# Patient Record
Sex: Male | Born: 1970 | Race: White | Hispanic: No | Marital: Single | Smoking: Never smoker
Health system: Southern US, Community
[De-identification: ages and names within clinical notes are randomized; demographics above are authoritative.]

## PROBLEM LIST (undated history)

## (undated) DIAGNOSIS — E785 Hyperlipidemia, unspecified: Secondary | ICD-10-CM

## (undated) DIAGNOSIS — I422 Other hypertrophic cardiomyopathy: Secondary | ICD-10-CM

## (undated) DIAGNOSIS — I517 Cardiomegaly: Secondary | ICD-10-CM

## (undated) DIAGNOSIS — I2510 Atherosclerotic heart disease of native coronary artery without angina pectoris: Secondary | ICD-10-CM

## (undated) DIAGNOSIS — E119 Type 2 diabetes mellitus without complications: Secondary | ICD-10-CM

## (undated) DIAGNOSIS — N182 Chronic kidney disease, stage 2 (mild): Secondary | ICD-10-CM

## (undated) HISTORY — DX: Cardiomegaly: I51.7

## (undated) HISTORY — DX: Other hypertrophic cardiomyopathy: I42.2

## (undated) HISTORY — DX: Hyperlipidemia, unspecified: E78.5

## (undated) HISTORY — DX: Atherosclerotic heart disease of native coronary artery without angina pectoris: I25.10

## (undated) HISTORY — DX: Type 2 diabetes mellitus without complications: E11.9

## (undated) HISTORY — PX: TRICEPS TENDON REPAIR: SHX2577

## (undated) HISTORY — DX: Chronic kidney disease, stage 2 (mild): N18.2

---

## 1997-06-14 ENCOUNTER — Ambulatory Visit (HOSPITAL_COMMUNITY): Admission: RE | Admit: 1997-06-14 | Discharge: 1997-06-14 | Payer: Self-pay

## 1997-06-21 ENCOUNTER — Ambulatory Visit (HOSPITAL_BASED_OUTPATIENT_CLINIC_OR_DEPARTMENT_OTHER): Admission: RE | Admit: 1997-06-21 | Discharge: 1997-06-21 | Payer: Self-pay | Admitting: Plastic Surgery

## 1999-04-25 ENCOUNTER — Encounter (INDEPENDENT_AMBULATORY_CARE_PROVIDER_SITE_OTHER): Payer: Self-pay | Admitting: Specialist

## 1999-04-25 ENCOUNTER — Ambulatory Visit (HOSPITAL_BASED_OUTPATIENT_CLINIC_OR_DEPARTMENT_OTHER): Admission: RE | Admit: 1999-04-25 | Discharge: 1999-04-25 | Payer: Self-pay | Admitting: Plastic Surgery

## 1999-05-01 ENCOUNTER — Encounter (INDEPENDENT_AMBULATORY_CARE_PROVIDER_SITE_OTHER): Payer: Self-pay | Admitting: *Deleted

## 1999-05-01 ENCOUNTER — Ambulatory Visit (HOSPITAL_BASED_OUTPATIENT_CLINIC_OR_DEPARTMENT_OTHER): Admission: RE | Admit: 1999-05-01 | Discharge: 1999-05-01 | Payer: Self-pay | Admitting: Plastic Surgery

## 1999-06-02 ENCOUNTER — Emergency Department (HOSPITAL_COMMUNITY): Admission: EM | Admit: 1999-06-02 | Discharge: 1999-06-02 | Payer: Self-pay | Admitting: Emergency Medicine

## 1999-06-03 ENCOUNTER — Encounter: Payer: Self-pay | Admitting: Emergency Medicine

## 1999-08-08 ENCOUNTER — Ambulatory Visit (HOSPITAL_COMMUNITY): Admission: RE | Admit: 1999-08-08 | Discharge: 1999-08-08 | Payer: Self-pay | Admitting: Surgery

## 2003-02-11 ENCOUNTER — Ambulatory Visit (HOSPITAL_BASED_OUTPATIENT_CLINIC_OR_DEPARTMENT_OTHER): Admission: RE | Admit: 2003-02-11 | Discharge: 2003-02-11 | Payer: Self-pay | Admitting: Surgery

## 2007-05-29 ENCOUNTER — Ambulatory Visit (HOSPITAL_BASED_OUTPATIENT_CLINIC_OR_DEPARTMENT_OTHER): Admission: RE | Admit: 2007-05-29 | Discharge: 2007-05-29 | Payer: Self-pay | Admitting: Orthopedic Surgery

## 2010-06-20 NOTE — Op Note (Signed)
Roberto Morrison, Roberto Morrison NO.:  [id number]   MEDICAL RECORD NO.:  [id number]          PATIENT TYPE:  AMB   LOCATION:  DSC                          FACILITY:  [facility]   PHYSICIAN:  Loreta Ave, M.D. DATE OF BIRTH:  Oct 01, 1970   DATE OF PROCEDURE:  05/29/2007  DATE OF DISCHARGE:                               OPERATIVE REPORT   PREOPERATIVE DIAGNOSIS:  Complete avulsion, triceps tendon, left elbow  of olecranon.  Olecranon spurring.   POSTOPERATIVE DIAGNOSIS:  Complete avulsion, triceps tendon, left elbow  of olecranon.  Olecranon spurring.   PROCEDURE:  Exploration left elbow with debridement and primary repair,  reattachment of triceps tendon to the olecranon with a 5.5-mm  bioabsorbable anchor and FiberWire suture x2.  Debridement of olecranon  spur.   SURGEON:  Loreta Ave, MD.   ASSISTANT:  Genene Churn. Barry Dienes, PA.   ANESTHESIA:  General   BLOOD LOSS:  Minimal.   TOURNIQUET TIME:  45 minutes.   SPECIMENS:  None.   CULTURES:  None.   COMPLICATIONS:  None.   DRESSINGS:  Soft compressive with a Bledsoe brace locked at 70 degrees  of flexion.   PROCEDURE:  The patient was brought to the operating room, placed on the  table in supine position.  After adequate anesthesia obtained,  tourniquet applied upper aspect of left arm.  Prepped and draped in the  usual sterile fashion.  Longitudinal incision from the olecranon  proximally.  Skin and subcutaneous tissue divided.  Triceps completely  avulsed off with a fair amount of intratendinous tearing.  Debrided back  to healthy tissue.  Retracted 3-4 cm, but still very mobile as it was an  acute tear.  Ulnar nerve identified and protected throughout of the  medial side.  Olecranon reduced back to healthy tissue.  Spurring at the  olecranon triceps attachment taken down smoothly with fluoroscopic  guidance.  Using fluoroscopy, an anchor was then placed right in the  middle of the olecranon at the triceps  attachment with a 5.5-mm  bioabsorbable anchor.  The #2 FiberWire sutures were then weaved up.  The medial and lateral side of the triceps up in the healthy tissue and  then brought back down.  Elbow extended, and sutures firmly tied down  giving me a nice firm reattachment of the triceps down to the olecranon.  The small avulsions of bone within a tendon had been debrided.  I could  bring it almost 90 degrees of flexion before I start to get too much  tension on the repair.  The soft tissue over top of this was oversewn  once I felt to have a good repair.  Wound irrigated.  Closed with Vicryl and staples.  Sterile compressive  dressing applied.  Sling applied with the brace as mentioned above.  Tourniquet have been deflated and removed.  The anesthesia reversed.  Brought to recovery room.  Tolerated surgery well.  No complications.      Loreta Ave, M.D.  Electronically Signed     DFM/MEDQ  D:  05/29/2007  T:  05/30/2007  Job:  [id number]

## 2010-06-23 NOTE — Op Note (Signed)
[facility]  Patient:    Roberto Morrison, Roberto Morrison                        MRN: [id number] Proc. Date: 08/08/99 Adm. Date:  [id number] Attending:  Abigail Miyamoto A                           Operative Report  PREOPERATIVE DIAGNOSIS:  Umbilical hernia.  POSTOPERATIVE DIAGNOSIS:  Umbilical hernia.  PROCEDURE:  Umbilical hernia repair.  SURGEON:  Abigail Miyamoto, M.D.  ANESTHESIA:  General endotracheal anesthesia and 0.25% Marcaine plain.  ESTIMATED BLOOD LOSS:  Minimal.  PROCEDURE IN DETAIL:  The patient was brought to the operating room and properly identified.  He was placed supine on the operating table and general anesthesia was induced.  Next, his abdomen was prepped and draped in the usual sterile fashion. using a #15 blade, a small transverse incision was made just below the umbilicus. The incision was carried down through the fascia with the cautery.  The umbilicus was the controlled circumferentially with a hemostat and then dissected free from the underlying soft tissue.  The small hernia defect was then identified and was found to contain preperitoneal fat.  ______ fat was excised.  The hernia defect  was then closed with two interrupted #1 Novofil sutures in a figure-of-eight pattern.  At this point, the wound was then anesthetized with 0.25% Marcaine and then irrigated.  The umbilicus was fastened back down in place with a single 2-0 Vicryl suture.  The skin was then closed with interrupted 0 Vicryl sutures and interrupted 4-0 Monocryl.  Steri-Strips, gauze and Tegaderm was applied.  The patient tolerated the procedure well.  All sponge, needle and instrument counts  were correct at the end of the procedure.  The patient was then extubated in the operating room and taken in stable condition to the recovery room. DD:  08/08/99 TD:  08/08/99 Job: [id number]

## 2010-06-23 NOTE — Op Note (Signed)
NAMEQUASHON, [name]                           ACCOUNT NO.:  [id number]   MEDICAL RECORD NO.:  [id number]                   PATIENT TYPE:  AMB   LOCATION:  DSC                                  FACILITY:  [facility]   PHYSICIAN:  Abigail Miyamoto, M.D.              DATE OF BIRTH:  [birth year]   DATE OF PROCEDURE:  02/11/2003  DATE OF DISCHARGE:                                 OPERATIVE REPORT   PREOPERATIVE DIAGNOSIS:  Recurrent umbilical hernia.   POSTOPERATIVE DIAGNOSIS:  Recurrent umbilical hernia.   PROCEDURE:  Umbilical hernia repair with mesh.   SURGEON:  Abigail Miyamoto, M.D.   ANESTHESIA:  LMA and 0.5% Marcaine with epinephrine.   ESTIMATED BLOOD LOSS:  Minimal.   DESCRIPTION OF PROCEDURE:  The patient was brought to the operating room and  identified as Roberto Morrison.  He was placed supine on the operating table and  anesthesia was induced.  His abdomen was then prepped and draped in the  usual sterile fashion.  His previous scar just below the umbilicus was  anesthetized with 0.5% Marcaine.  The incision was then made through the  previous scar with a #15 blade.  Dissection was carried down to the fascia  which was identified.  His two previously placed sutures were still intact  and there was a small little opening between the sutures with a little  omentum sticking through it.  The size of this was approximately 3 mm.  The  previous sutures were removed and the fascia was opened slightly larger.  A  piece of Bard oval mesh was brought onto the field and placed down through  the fascial defect and then sewn underneath as an onlay with 0 Prolene  sutures.  The fascia was then closed over top of this with figure-of-eight  #1 Prolene sutures.  Good coverage of the defect with mesh and good closure  of the fascia was achieved.  The umbilicus was tied in place with a 3-0  Vicryl suture.  Subcutaneous tissue was closed with interrupted 0 Vicryl  sutures and the skin was closed with  running 4-0 Monocryl.  Steri-Strips,  gauze, and tape were then applied.  The patient tolerated the procedure  well.  All needle, sponge, and instrument counts correct at the end of the  procedure.  The patient was then extubated in the operating room and taken  in stable condition to the recovery room.                                               Abigail Miyamoto, M.D.    DB/MEDQ  D:  02/11/2003  T:  02/11/2003  Job:  [id number]

## 2010-06-23 NOTE — Op Note (Signed)
[place]  Patient:    Roberto Morrison, Roberto Morrison                        MRN: [id number] Proc. Date: 05/01/99 Adm. Date:  [id number] Attending:  Loura Halt Ii                           Operative Report  PREOPERATIVE DIAGNOSIS:  Lipomatosis of trunk.  POSTOPERATIVE DIAGNOSIS:  Lipomatosis of trunk.  OPERATION PERFORMED:  Excision of lipomas on back x five, lipomas ranged in size from 2 to 3 cm.  SURGEON:  Alfredia Ferguson, M.D.  ANESTHESIA:   1% Xylocaine, 1:100,000 epinephrine.  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 He has had numerous lipomas removed in the past.  He has five on his back, which now are visible and have been growing.  He wishes to have them removed to prevent any further growth of these lesions.  DESCRIPTION OF SURGERY:  With the patient in a prone position, areas for the excision were marked with a skin marker and local anesthesia was infiltrated in  five different areas.  There were three on the left side of the back and two on the right side.  The area was then prepped and draped in a sterile fashion.  After waiting 5 minutes, an incision was made over the first lipoma, which was in the  left upper back.  The incision was deepened through the skin and subcutaneous tissue.  A lobulated lipoma was visualized and using blunt dissection, was dissected out of its anatomic bed.  Hemostasis was accomplished using pressure.  The wound was closed by using a running 5-0 nylon suture.  The remaining four lipomas were removed in an identical fashion.  Light dressings were applied. Patient was discharged home in satisfactory condition. DD:  05/01/99 TD:  05/01/99 Job: [id number]

## 2010-06-23 NOTE — Op Note (Signed)
[place]. [facility]  Patient:    Roberto Morrison, Roberto Morrison                          MRN: [id number] Proc. Date: 04/25/99 Adm. Date:  [id number] Disc. Date: [id number] Attending:  Loura Halt Ii                           Operative Report  PREOPERATIVE DIAGNOSIS:  Lipomatosis of trunk.  POSTOPERATIVE DIAGNOSIS:  Lipomatosis of trunk.  PROCEDURE:  Excision of lipomas anterior right trunk x six, size ranging from 1 cm to 2.5 cm.  SURGEON:  Alfredia Ferguson, M.D.  ANESTHESIA:  Xylocaine 1%, 1:100,000 epinephrine.  INDICATIONS:  This is a 40 year old gentleman with lipomatosis of his trunk. He has had numerous lipomas removed in the past.  He has about six more that he wishes to have removed today.  He has another half a dozen or so on his posterior trunk, which he is scheduled to remove in the next several weeks.  Each of these areas  were marked prior to beginning the surgery.  DESCRIPTION OF PROCEDURE:  Patient was placed in the supine position and the right side of his abdomen was prepped and draped in a sterile fashion.  Local anesthesia was infiltrated overlying each mark which had been placed over the lipomas. After waiting approximately 5 minutes, an incision was made over each of the six lipomas in his anterior abdomen.  Using blunt dissection, the normal fatty tissue was spread until visualizing the lipoma.  The lipoma was grasped and dissected away  from the surrounding fatty tissue.  The lipomas were removed individually from ach of the six incisions.  Following removal of the lipomas, each incision was closed using running nylon 5-0. DD:  04/27/99 TD:  04/27/99 Job: [id number]

## 2010-10-31 LAB — POCT HEMOGLOBIN-HEMACUE: Hemoglobin: 16.6

## 2012-03-14 ENCOUNTER — Other Ambulatory Visit: Payer: Self-pay | Admitting: Orthopedic Surgery

## 2012-03-14 DIAGNOSIS — M898X2 Other specified disorders of bone, upper arm: Secondary | ICD-10-CM

## 2012-03-18 ENCOUNTER — Ambulatory Visit
Admission: RE | Admit: 2012-03-18 | Discharge: 2012-03-18 | Disposition: A | Discharge status: Home / Self Care | Payer: Medicare PPO | Source: Ambulatory Visit | Attending: Orthopedic Surgery | Admitting: Orthopedic Surgery

## 2012-03-18 DIAGNOSIS — M898X2 Other specified disorders of bone, upper arm: Secondary | ICD-10-CM

## 2013-12-28 ENCOUNTER — Emergency Department (HOSPITAL_BASED_OUTPATIENT_CLINIC_OR_DEPARTMENT_OTHER): Payer: Medicare PPO

## 2013-12-28 ENCOUNTER — Encounter (HOSPITAL_BASED_OUTPATIENT_CLINIC_OR_DEPARTMENT_OTHER): Payer: Self-pay | Admitting: *Deleted

## 2013-12-28 ENCOUNTER — Emergency Department (HOSPITAL_BASED_OUTPATIENT_CLINIC_OR_DEPARTMENT_OTHER)
Admission: EM | Admit: 2013-12-28 | Discharge: 2013-12-28 | Disposition: A | Discharge status: Home / Self Care | Payer: Medicare PPO | Attending: Emergency Medicine | Admitting: Emergency Medicine

## 2013-12-28 DIAGNOSIS — R0789 Other chest pain: Secondary | ICD-10-CM | POA: Insufficient documentation

## 2013-12-28 DIAGNOSIS — R945 Abnormal results of liver function studies: Secondary | ICD-10-CM | POA: Diagnosis not present

## 2013-12-28 DIAGNOSIS — R7989 Other specified abnormal findings of blood chemistry: Secondary | ICD-10-CM

## 2013-12-28 DIAGNOSIS — R079 Chest pain, unspecified: Secondary | ICD-10-CM | POA: Diagnosis present

## 2013-12-28 LAB — COMPREHENSIVE METABOLIC PANEL
ALBUMIN: 3.7 g/dL (ref 3.5–5.2)
ALK PHOS: 40 U/L (ref 39–117)
ALT: 101 U/L — ABNORMAL HIGH (ref 0–53)
AST: 60 U/L — ABNORMAL HIGH (ref 0–37)
Anion gap: 9 (ref 5–15)
BILIRUBIN TOTAL: 0.7 mg/dL (ref 0.3–1.2)
BUN: 24 mg/dL — ABNORMAL HIGH (ref 6–23)
CHLORIDE: 106 meq/L (ref 96–112)
CO2: 28 mEq/L (ref 19–32)
Calcium: 9.7 mg/dL (ref 8.4–10.5)
Creatinine, Ser: 1.3 mg/dL (ref 0.50–1.35)
GFR calc Af Amer: 77 mL/min — ABNORMAL LOW (ref >90)
GFR calc non Af Amer: 66 mL/min — ABNORMAL LOW (ref >90)
Glucose, Bld: 107 mg/dL — ABNORMAL HIGH (ref 70–99)
POTASSIUM: 4.5 meq/L (ref 3.7–5.3)
Sodium: 143 mEq/L (ref 137–147)
Total Protein: 6.6 g/dL (ref 6.0–8.3)

## 2013-12-28 LAB — CBC WITH DIFFERENTIAL/PLATELET
BASOS ABS: 0 10*3/uL (ref 0.0–0.1)
Basophils Relative: 0 % (ref 0–1)
EOS PCT: 2 % (ref 0–5)
Eosinophils Absolute: 0.1 10*3/uL (ref 0.0–0.7)
HEMATOCRIT: 43.9 % (ref 39.0–52.0)
Hemoglobin: 15.3 g/dL (ref 13.0–17.0)
Lymphocytes Relative: 37 % (ref 12–46)
Lymphs Abs: 1.8 10*3/uL (ref 0.7–4.0)
MCH: 31.4 pg (ref 26.0–34.0)
MCHC: 34.9 g/dL (ref 30.0–36.0)
MCV: 90.1 fL (ref 78.0–100.0)
MONO ABS: 0.5 10*3/uL (ref 0.1–1.0)
MONOS PCT: 9 % (ref 3–12)
Neutro Abs: 2.5 10*3/uL (ref 1.7–7.7)
Neutrophils Relative %: 52 % (ref 43–77)
Platelets: 155 10*3/uL (ref 150–400)
RBC: 4.87 MIL/uL (ref 4.22–5.81)
RDW: 14.6 % (ref 11.5–15.5)
WBC: 4.8 10*3/uL (ref 4.0–10.5)

## 2013-12-28 LAB — TROPONIN I: Troponin I: <0.3 ng/mL (ref <0.30)

## 2013-12-28 NOTE — ED Notes (Signed)
Pt amb to room 11 with quick steady gait in nad. Pt reports "funny feeling" in left chest x last Monday. Pt reports he had this same pain 2 years ago, seen at [facility] with no findings per pt.

## 2013-12-28 NOTE — ED Provider Notes (Signed)
CSN: [id number]     Arrival date & time 12/28/13  0945 History   First MD Initiated Contact with Patient 12/28/13 0957     Chief Complaint  Patient presents with  . Chest Pain     (Consider location/radiation/quality/duration/timing/severity/associated sxs/prior Treatment) Patient is a 43 y.o. male presenting with chest pain. The history is provided by the patient. No language interpreter was used.  Chest Pain Pain location:  L chest Pain quality: aching   Pain radiates to:  Does not radiate Pain radiates to the back: no   Pain severity:  Moderate Onset quality:  Gradual Duration:  1 week Timing:  Constant Progression:  Unchanged Context: not breathing, not eating, no movement, not raising an arm, no stress and no trauma   Relieved by:  Nothing Worsened by:  Nothing tried Associated symptoms: no abdominal pain, no altered mental status, no back pain, no cough, no palpitations, no shortness of breath and not vomiting   Risk factors: male sex   Risk factors: no high cholesterol, no hypertension and no smoking    Patient reports he had a similar episode 2 years ago. Patient was evaluated at [facility]. Patient reports he did not have any unusual findings. History reviewed. No pertinent past medical history. History reviewed. No pertinent past surgical history. History reviewed. No pertinent family history. History  Substance Use Topics  . Smoking status: Never Smoker   . Smokeless tobacco: Not on file  . Alcohol Use: Not on file    Review of Systems  Respiratory: Negative for cough and shortness of breath.   Cardiovascular: Positive for chest pain. Negative for palpitations.  Gastrointestinal: Negative for vomiting and abdominal pain.  Musculoskeletal: Negative for back pain.  All other systems reviewed and are negative.     Allergies  Review of patient's allergies indicates no known allergies.  Home Medications   Prior to Admission medications   Not on File    BP 138/87 mmHg  Pulse 71  Temp(Src) 97.8 F (36.6 C) (Oral)  Resp 16  Ht 6' (1.829 m)  Wt 275 lb (124.739 kg)  BMI 37.29 kg/m2  SpO2 98% Physical Exam  Constitutional: He appears well-developed.  HENT:  Head: Normocephalic.  Right Ear: External ear normal.  Left Ear: External ear normal.  Eyes: Conjunctivae and EOM are normal. Pupils are equal, round, and reactive to light.  Neck: Normal range of motion. Neck supple.  Cardiovascular: Normal rate and normal heart sounds.   Pulmonary/Chest: Effort normal and breath sounds normal.  Abdominal: Soft.  Musculoskeletal: Normal range of motion.  Neurological: He is alert.  Skin: Skin is warm.  Psychiatric: He has a normal mood and affect.  Nursing note and vitals reviewed.   ED Course  Procedures (including critical care time) Labs Review Labs Reviewed - No data to display  Imaging Review No results found.   EKG Interpretation   Date/Time:  Monday December 28 2013 09:51:04 EST Ventricular Rate:  71 PR Interval:  172 QRS Duration: 112 QT Interval:  402 QTC Calculation: 436 R Axis:   -19 Text Interpretation:  Normal sinus rhythm Normal ECG Confirmed by POLLINA   MD, CHRISTOPHER ([id number]) on 12/28/2013 10:17:31 AM      MDM Troponin  Is negative.LFt's are slightly elevated at 24 AST is elevated at 60 AST is elevated at 10 1 glucose is 107.  Chest xray is normal.   Pt's chest wall is nontender.  I advised follow up with primary regarding elevated bun and  lft.  Pt advised that he needs repeat labs.  I advised he needs further evaluation.  EKG is normal and troponin is negative.  He is advised to follow up for further evaluation.  Pt advised asa.    Final diagnoses:  Chest pain, unspecified chest pain type  LFT elevation        Fransico Meadow, PA-C 12/28/13 [place], MD 12/28/13 1306

## 2013-12-28 NOTE — Discharge Instructions (Signed)
Chest Pain (Nonspecific) It is often hard to give a diagnosis for the cause of chest pain. There is always a chance that your pain could be related to something serious, such as a heart attack or a blood clot in the lungs. You need to follow up with your doctor. HOME CARE  If antibiotic medicine was given, take it as directed by your doctor. Finish the medicine even if you start to feel better.  For the next few days, avoid activities that bring on chest pain. Continue physical activities as told by your doctor.  Do not use any tobacco products. This includes cigarettes, chewing tobacco, and e-cigarettes.  Avoid drinking alcohol.  Only take medicine as told by your doctor.  Follow your doctor's suggestions for more testing if your chest pain does not go away.  Keep all doctor visits you made. GET HELP IF:  Your chest pain does not go away, even after treatment.  You have a rash with blisters on your chest.  You have a fever. GET HELP RIGHT AWAY IF:   You have more pain or pain that spreads to your arm, neck, jaw, back, or belly (abdomen).  You have shortness of breath.  You cough more than usual or cough up blood.  You have very bad back or belly pain.  You feel sick to your stomach (nauseous) or throw up (vomit).  You have very bad weakness.  You pass out (faint).  You have chills. This is an emergency. Do not wait to see if the problems will go away. Call your local emergency services (911 in [place] Do not drive yourself to the hospital. MAKE SURE YOU:   Understand these instructions.  Will watch your condition.  Will get help right away if you are not doing well or get worse. Document Released: 07/11/2007 Document Revised: 01/27/2013 Document Reviewed: 07/11/2007 [facility] Patient Information 2015 [place], [place]. This information is not intended to replace advice given to you by your health care provider. Make sure you discuss any questions you have with your  health care provider. Liver Profile A liver profile is a battery of tests which helps your caregiver evaluate your liver function. The following tests are often included in the liver profile: Alanine aminotransferase (ALT or SGPT) This is an enzyme found primarily in the liver. Abnormalities may represent liver disease. This is found in cells of the liver so when it is elevated, it has been released by damaged cells. Albumin - The serum albumin is one of the major proteins in the blood and a reflection of the general state of nutrition. This is low when the liver is unable to do its job. It is also low when protein is lost in the urine. NORMAL FINDINGS Adult/Elderly  Total protein: 6.4-8.3 g/dL or 64-83 g/L (SI units)  Albumin: 3.5-5 g/dL or 35-50 g/L (SI units)  Globulin: 2.3-3.4 g/dL  Alpha1 globulin: 0.1-0.3 g/dL or 1-3 g/L (SI units)  Alpha2 globulin: 0.6-1 g/dL or 6-10 g/L (SI units)  Beta globulin: 0.7-1.1 g/dL or 7-11 g/L (SI units) Children  Total protein  Premature infant: 4.2-7.6 g/dL  Newborn: 4.6-7.4 g/dL  Infant: 6-6.7 g/dL  Child: 6.2-8 g/dL  Albumin  Premature infant: 3-4.2 g/dL  Newborn: 3.5-5.4 g/dL  Infant: 4.4-5.4 g/dL  Child: 4-5.9 g/dL Albumin/Globulin ratio - Calculated by dividing the albumin by the globulin. It is a measure of well being.  Alkaline phosphatase - This is an enzyme which is important in diagnosing proper bone and liver functions.  NORMAL FINDINGS Age / Normal Value (units/L)  0-5 days / 35-140  Less than 3 yr / 15-60  3-6 yr / 15-50  6-12 yr / 10-50  12-18 yr / 10-40  Adult / 0-35 units/L or 0-0.58 microKat/L (SI Units) (Females tend to have slightly lower levels than males)  Elderly / Slightly higher than adults Aspartate aminotransferase (AST or SGOT) - an enzyme found in skeletal and heart muscle, liver and other organs. Abnormalities may represent liver disease. This is found in cells of the liver so when it is  elevated, it has been released by damaged cells. Bilirubin, Total: A chemical involved with liver functions. High concentrations may result in jaundice. Jaundice is a yellowing of the skin and the whites of the eyes. NORMAL FINDINGS Blood  Adult/elderly/child  Total bilirubin: 0.3-1.0 mg/dL or 5.1-17 micromole/L (SI units)  Indirect bilirubin: 0.2-0.8 mg/dL or 3.4-12.0 micromole/L (SI units)  Direct bilirubin: 0.1-0.3 mg/dL or 1.7-5.1 micromole/L (SI units)  Newborn total bilirubin: 1.0-12.0 mg/dL or 17.1-205 micromole/L (SI units)  Urine0-0.02 mg/dL Ranges for normal findings may vary among different laboratories and hospitals. You should always check with your doctor after having lab work or other tests done to discuss the meaning of your test results and whether your values are considered within normal limits PREPARATION FOR TEST No preparation or fasting is necessary unless you have been informed otherwise. A blood sample is obtained by inserting a needle into a vein in the arm. MEANING OF TEST  Your caregiver will go over the test results with you and discuss the importance and meaning of your results, as well as treatment options and the need for additional tests if necessary. OBTAINING THE TEST RESULTS It is your responsibility to obtain your test results. Ask the lab or department performing the test when and how you will get your results. Document Released: 02/25/2004 Document Revised: 04/16/2011 Document Reviewed: 05/26/2013 [facility] Patient Information 2015 [place], [place]. This information is not intended to replace advice given to you by your health care provider. Make sure you discuss any questions you have with your health care provider.

## 2014-11-24 DIAGNOSIS — B002 Herpesviral gingivostomatitis and pharyngotonsillitis: Secondary | ICD-10-CM | POA: Insufficient documentation

## 2020-03-07 ENCOUNTER — Other Ambulatory Visit: Payer: Self-pay | Admitting: Chiropractic Medicine

## 2020-03-07 DIAGNOSIS — R29898 Other symptoms and signs involving the musculoskeletal system: Secondary | ICD-10-CM

## 2020-03-07 DIAGNOSIS — M79621 Pain in right upper arm: Secondary | ICD-10-CM

## 2020-03-25 ENCOUNTER — Other Ambulatory Visit: Payer: Self-pay | Admitting: Chiropractic Medicine

## 2020-03-28 ENCOUNTER — Ambulatory Visit
Admission: RE | Admit: 2020-03-28 | Discharge: 2020-03-28 | Disposition: A | Discharge status: Home / Self Care | Payer: 59 | Source: Ambulatory Visit | Attending: Chiropractic Medicine | Admitting: Chiropractic Medicine

## 2020-03-28 DIAGNOSIS — R29898 Other symptoms and signs involving the musculoskeletal system: Secondary | ICD-10-CM

## 2020-03-28 DIAGNOSIS — M79621 Pain in right upper arm: Secondary | ICD-10-CM

## 2020-07-21 ENCOUNTER — Other Ambulatory Visit: Payer: Self-pay

## 2020-07-21 ENCOUNTER — Inpatient Hospital Stay (HOSPITAL_BASED_OUTPATIENT_CLINIC_OR_DEPARTMENT_OTHER)
Admission: EM | Admit: 2020-07-21 | Discharge: 2020-07-30 | DRG: 233 | Disposition: A | Discharge status: Home / Self Care | Payer: 59 | Attending: Cardiothoracic Surgery | Admitting: Cardiothoracic Surgery

## 2020-07-21 ENCOUNTER — Encounter (HOSPITAL_BASED_OUTPATIENT_CLINIC_OR_DEPARTMENT_OTHER): Payer: Self-pay

## 2020-07-21 ENCOUNTER — Emergency Department (HOSPITAL_BASED_OUTPATIENT_CLINIC_OR_DEPARTMENT_OTHER): Payer: 59

## 2020-07-21 DIAGNOSIS — N289 Disorder of kidney and ureter, unspecified: Secondary | ICD-10-CM | POA: Diagnosis not present

## 2020-07-21 DIAGNOSIS — R072 Precordial pain: Secondary | ICD-10-CM | POA: Diagnosis present

## 2020-07-21 DIAGNOSIS — I2510 Atherosclerotic heart disease of native coronary artery without angina pectoris: Secondary | ICD-10-CM | POA: Diagnosis not present

## 2020-07-21 DIAGNOSIS — I110 Hypertensive heart disease with heart failure: Secondary | ICD-10-CM | POA: Diagnosis present

## 2020-07-21 DIAGNOSIS — I25118 Atherosclerotic heart disease of native coronary artery with other forms of angina pectoris: Secondary | ICD-10-CM | POA: Diagnosis present

## 2020-07-21 DIAGNOSIS — I255 Ischemic cardiomyopathy: Secondary | ICD-10-CM | POA: Diagnosis not present

## 2020-07-21 DIAGNOSIS — Z9889 Other specified postprocedural states: Secondary | ICD-10-CM

## 2020-07-21 DIAGNOSIS — Z951 Presence of aortocoronary bypass graft: Secondary | ICD-10-CM | POA: Diagnosis not present

## 2020-07-21 DIAGNOSIS — R079 Chest pain, unspecified: Secondary | ICD-10-CM | POA: Diagnosis not present

## 2020-07-21 DIAGNOSIS — Z888 Allergy status to other drugs, medicaments and biological substances status: Secondary | ICD-10-CM

## 2020-07-21 DIAGNOSIS — I444 Left anterior fascicular block: Secondary | ICD-10-CM | POA: Diagnosis present

## 2020-07-21 DIAGNOSIS — I5021 Acute systolic (congestive) heart failure: Secondary | ICD-10-CM | POA: Diagnosis present

## 2020-07-21 DIAGNOSIS — I25110 Atherosclerotic heart disease of native coronary artery with unstable angina pectoris: Secondary | ICD-10-CM | POA: Diagnosis not present

## 2020-07-21 DIAGNOSIS — D62 Acute posthemorrhagic anemia: Secondary | ICD-10-CM | POA: Diagnosis not present

## 2020-07-21 DIAGNOSIS — I2584 Coronary atherosclerosis due to calcified coronary lesion: Secondary | ICD-10-CM | POA: Diagnosis present

## 2020-07-21 DIAGNOSIS — Z20822 Contact with and (suspected) exposure to covid-19: Secondary | ICD-10-CM | POA: Diagnosis present

## 2020-07-21 DIAGNOSIS — I214 Non-ST elevation (NSTEMI) myocardial infarction: Secondary | ICD-10-CM | POA: Diagnosis present

## 2020-07-21 DIAGNOSIS — E785 Hyperlipidemia, unspecified: Secondary | ICD-10-CM | POA: Diagnosis not present

## 2020-07-21 DIAGNOSIS — Z01810 Encounter for preprocedural cardiovascular examination: Secondary | ICD-10-CM | POA: Diagnosis not present

## 2020-07-21 LAB — RESP PANEL BY RT-PCR (FLU A&B, COVID) ARPGX2
Influenza A by PCR: NEGATIVE
Influenza B by PCR: NEGATIVE
SARS Coronavirus 2 by RT PCR: NEGATIVE

## 2020-07-21 LAB — BASIC METABOLIC PANEL
Anion gap: 7 (ref 5–15)
Anion gap: 7 (ref 5–15)
BUN: 19 mg/dL (ref 6–20)
BUN: 15 mg/dL (ref 6–20)
CO2: 27 mmol/L (ref 22–32)
CO2: 26 mmol/L (ref 22–32)
Calcium: 9.3 mg/dL (ref 8.9–10.3)
Calcium: 9 mg/dL (ref 8.9–10.3)
Chloride: 104 mmol/L (ref 98–111)
Chloride: 104 mmol/L (ref 98–111)
Creatinine, Ser: 1.46 mg/dL — ABNORMAL HIGH (ref 0.61–1.24)
Creatinine, Ser: 1.31 mg/dL — ABNORMAL HIGH (ref 0.61–1.24)
GFR, Estimated: 59 mL/min — ABNORMAL LOW (ref >60)
GFR, Estimated: >60 mL/min (ref >60)
Glucose, Bld: 111 mg/dL — ABNORMAL HIGH (ref 70–99)
Glucose, Bld: 138 mg/dL — ABNORMAL HIGH (ref 70–99)
Potassium: 4.1 mmol/L (ref 3.5–5.1)
Potassium: 3.5 mmol/L (ref 3.5–5.1)
Sodium: 138 mmol/L (ref 135–145)
Sodium: 137 mmol/L (ref 135–145)

## 2020-07-21 LAB — CBC
HCT: 48 % (ref 39.0–52.0)
Hemoglobin: 16.4 g/dL (ref 13.0–17.0)
MCH: 31.8 pg (ref 26.0–34.0)
MCHC: 34.2 g/dL (ref 30.0–36.0)
MCV: 93 fL (ref 80.0–100.0)
Platelets: 202 10*3/uL (ref 150–400)
RBC: 5.16 MIL/uL (ref 4.22–5.81)
RDW: 14.3 % (ref 11.5–15.5)
WBC: 8 10*3/uL (ref 4.0–10.5)
nRBC: 0 % (ref 0.0–0.2)

## 2020-07-21 LAB — TROPONIN I (HIGH SENSITIVITY)
Troponin I (High Sensitivity): 150 ng/L (ref <18)
Troponin I (High Sensitivity): 270 ng/L (ref <18)
Troponin I (High Sensitivity): 679 ng/L (ref <18)
Troponin I (High Sensitivity): 1161 ng/L (ref <18)

## 2020-07-21 LAB — CBC WITH DIFFERENTIAL/PLATELET
Abs Immature Granulocytes: 0.03 10*3/uL (ref 0.00–0.07)
Basophils Absolute: 0 10*3/uL (ref 0.0–0.1)
Basophils Relative: 0 %
Eosinophils Absolute: 0.1 10*3/uL (ref 0.0–0.5)
Eosinophils Relative: 1 %
HCT: 46.7 % (ref 39.0–52.0)
Hemoglobin: 16.2 g/dL (ref 13.0–17.0)
Immature Granulocytes: 0 %
Lymphocytes Relative: 16 %
Lymphs Abs: 1.4 10*3/uL (ref 0.7–4.0)
MCH: 32.7 pg (ref 26.0–34.0)
MCHC: 34.7 g/dL (ref 30.0–36.0)
MCV: 94.2 fL (ref 80.0–100.0)
Monocytes Absolute: 0.5 10*3/uL (ref 0.1–1.0)
Monocytes Relative: 6 %
Neutro Abs: 6.9 10*3/uL (ref 1.7–7.7)
Neutrophils Relative %: 77 %
Platelets: 207 10*3/uL (ref 150–400)
RBC: 4.96 MIL/uL (ref 4.22–5.81)
RDW: 14.5 % (ref 11.5–15.5)
WBC: 9 10*3/uL (ref 4.0–10.5)
nRBC: 0 % (ref 0.0–0.2)

## 2020-07-21 LAB — PROTIME-INR
INR: 1.1 (ref 0.8–1.2)
Prothrombin Time: 14 seconds (ref 11.4–15.2)

## 2020-07-21 LAB — APTT: aPTT: 26 seconds (ref 24–36)

## 2020-07-21 MED ORDER — AMLODIPINE BESYLATE 5 MG PO TABS
5.0000 mg | ORAL_TABLET | Freq: Every day | ORAL | Status: DC
  Administered 2020-07-21 – 2020-07-23 (×3): 5 mg via ORAL
  Filled 2020-07-21 (×3): qty 1

## 2020-07-21 MED ORDER — ATORVASTATIN CALCIUM 80 MG PO TABS
80.0000 mg | ORAL_TABLET | Freq: Every day | ORAL | Status: DC
  Administered 2020-07-21 – 2020-07-30 (×9): 80 mg via ORAL
  Filled 2020-07-21 (×9): qty 1

## 2020-07-21 MED ORDER — HEPARIN BOLUS VIA INFUSION
4000.0000 [IU] | Freq: Once | INTRAVENOUS | Status: AC
  Administered 2020-07-21: 4000 [IU] via INTRAVENOUS

## 2020-07-21 MED ORDER — ACETAMINOPHEN 325 MG PO TABS: 650.0000 mg | ORAL_TABLET | ORAL | Status: DC | PRN

## 2020-07-21 MED ORDER — SODIUM CHLORIDE 0.9 % IV SOLN: INTRAVENOUS | Status: DC

## 2020-07-21 MED ORDER — LABETALOL HCL 5 MG/ML IV SOLN
5.0000 mg | Freq: Once | INTRAVENOUS | Status: AC
  Administered 2020-07-21: 5 mg via INTRAVENOUS
  Filled 2020-07-21: qty 4

## 2020-07-21 MED ORDER — NITROGLYCERIN 0.4 MG SL SUBL: 0.4000 mg | SUBLINGUAL_TABLET | SUBLINGUAL | Status: DC | PRN

## 2020-07-21 MED ORDER — ASPIRIN EC 81 MG PO TBEC
81.0000 mg | DELAYED_RELEASE_TABLET | Freq: Every day | ORAL | Status: DC
  Administered 2020-07-22: 81 mg via ORAL
  Filled 2020-07-21: qty 1

## 2020-07-21 MED ORDER — METOPROLOL TARTRATE 12.5 MG HALF TABLET
12.5000 mg | ORAL_TABLET | Freq: Two times a day (BID) | ORAL | Status: DC
  Administered 2020-07-21 – 2020-07-22 (×3): 12.5 mg via ORAL
  Filled 2020-07-21 (×3): qty 1

## 2020-07-21 MED ORDER — HEPARIN (PORCINE) 25000 UT/250ML-% IV SOLN
1950.0000 [IU]/h | INTRAVENOUS | Status: DC
  Administered 2020-07-21: 1450 [IU]/h via INTRAVENOUS
  Administered 2020-07-22: 1650 [IU]/h via INTRAVENOUS
  Filled 2020-07-21 (×2): qty 250

## 2020-07-21 MED ORDER — ONDANSETRON HCL 4 MG/2ML IJ SOLN: 4.0000 mg | Freq: Four times a day (QID) | INTRAMUSCULAR | Status: DC | PRN

## 2020-07-21 NOTE — ED Provider Notes (Signed)
Emergency Department Provider Note   I have reviewed the triage vital signs and the nursing notes.   HISTORY  Chief Complaint Chest Pain   HPI Roberto Morrison is a 50 y.o. male with past medical history reviewed below presents to the emergency department with squeezing chest discomfort which began 45 minutes prior to ED presentation.  The patient states that he was at the gym working out when he developed some squeezing discomfort in the center of his chest.  He was not exerting himself significantly at the time of chest pain onset.  There was no syncope/near syncope symptoms.  He is not feeling short of breath.  He states he was already sweaty from his workout at the time of onset of pain.  The pain lasted for around 45 minutes and then resolved spontaneously.  He states he has had issues in the past with some occasional pain in his chest.  He states he did come to the emergency department once for this but there was no evidence of heart attack.  He has not followed with a cardiologist.  He does drink preworkout but did not have symptoms immediately after drinking the drink. No symptoms currently.    History reviewed. No pertinent past medical history.  Patient Active Problem List   Diagnosis Date Noted   Precordial chest pain 07/21/2020     No past surgical history on file.  Allergies Prednisone  No family history on file.  Social History Social History   Tobacco Use   Smoking status: Never   Smokeless tobacco: Never  Substance Use Topics   Alcohol use: Not Currently   Drug use: Never    Review of Systems  Constitutional: No fever/chills Eyes: No visual changes. ENT: No sore throat. Cardiovascular: Positive chest pain. Respiratory: Denies shortness of breath. Gastrointestinal: No abdominal pain.  No nausea, no vomiting.  No diarrhea.  No constipation. Genitourinary: Negative for dysuria. Musculoskeletal: Negative for back pain. Skin: Negative for  rash. Neurological: Negative for headaches, focal weakness or numbness.  10-point ROS otherwise negative.  ____________________________________________   PHYSICAL EXAM:  VITAL SIGNS: ED Triage Vitals  Enc Vitals Group     BP 07/21/20 1504 (!) 149/101     Pulse Rate 07/21/20 1504 79     Resp 07/21/20 1504 20     Temp 07/21/20 1509 98.7 F (37.1 C)     Temp Source 07/21/20 1509 Oral     SpO2 07/21/20 1504 100 %     Weight 07/21/20 1506 288 lb (130.6 kg)     Height 07/21/20 1506 6' (1.829 m)   Constitutional: Alert and oriented. Well appearing and in no acute distress. Eyes: Conjunctivae are normal.  Head: Atraumatic. Nose: No congestion/rhinnorhea. Mouth/Throat: Mucous membranes are moist.   Neck: No stridor.  Cardiovascular: Normal rate, regular rhythm. Good peripheral circulation. Grossly normal heart sounds.   Respiratory: Normal respiratory effort.  No retractions. Lungs CTAB. Gastrointestinal: Soft and nontender. No distention.  Musculoskeletal: No lower extremity tenderness nor edema. No gross deformities of extremities. Neurologic:  Normal speech and language. No gross focal neurologic deficits are appreciated.  Skin:  Skin is warm, dry and intact. No rash noted.  ____________________________________________   LABS (all labs ordered are listed, but only abnormal results are displayed)  Labs Reviewed  BASIC METABOLIC PANEL - Abnormal; Notable for the following components:      Result Value   Glucose, Bld 111 (*)    Creatinine, Ser 1.46 (*)  GFR, Estimated 59 (*)    All other components within normal limits  TROPONIN I (HIGH SENSITIVITY) - Abnormal; Notable for the following components:   Troponin I (High Sensitivity) 150 (*)    All other components within normal limits  TROPONIN I (HIGH SENSITIVITY) - Abnormal; Notable for the following components:   Troponin I (High Sensitivity) 270 (*)    All other components within normal limits  RESP PANEL BY RT-PCR  (FLU A&B, COVID) ARPGX2  CBC  APTT  PROTIME-INR  HEPARIN LEVEL (UNFRACTIONATED)  HEPARIN LEVEL (UNFRACTIONATED)  CBC   ____________________________________________  EKG   EKG Interpretation  Date/Time:  Thursday July 21 2020 15:07:29 EDT Ventricular Rate:  82 PR Interval:  154 QRS Duration: 114 QT Interval:  370 QTC Calculation: 432 R Axis:   -50 Text Interpretation: Normal sinus rhythm Left anterior fascicular block Moderate voltage criteria for LVH, may be normal variant ( R in aVL , Cornell product ) Abnormal ECG Confirmed by Nanda Quinton [phone]) on 07/21/2020 3:28:41 PM         ____________________________________________  RADIOLOGY  DG Chest 2 View  Result Date: 07/21/2020 CLINICAL DATA:  Chest pain EXAM: CHEST - 2 VIEW COMPARISON:  12/28/2013 FINDINGS: The heart size and mediastinal contours are within normal limits. Both lungs are clear. The visualized skeletal structures are unremarkable. IMPRESSION: No active cardiopulmonary disease. Electronically Signed   By: Donavan Foil M.D.   On: 07/21/2020 15:48    ____________________________________________   PROCEDURES  Procedure(s) performed:   Procedures  CRITICAL CARE Performed by: Margette Fast Total critical care time: 35 minutes Critical care time was exclusive of separately billable procedures and treating other patients. Critical care was necessary to treat or prevent imminent or life-threatening deterioration. Critical care was time spent personally by me on the following activities: development of treatment plan with patient and/or surrogate as well as nursing, discussions with consultants, evaluation of patient's response to treatment, examination of patient, obtaining history from patient or surrogate, ordering and performing treatments and interventions, ordering and review of laboratory studies, ordering and review of radiographic studies, pulse oximetry and re-evaluation of patient's  condition.  Nanda Quinton, MD Emergency Medicine  ____________________________________________   INITIAL IMPRESSION / ASSESSMENT AND PLAN / ED COURSE  Pertinent labs & imaging results that were available during my care of the patient were reviewed by me and considered in my medical decision making (see chart for details).   Patient presents the emergency department chest discomfort about 45 minutes prior to ED evaluation.  His EKG shows some LVH but no acute ischemic change.  He is not having active chest pain.  On exam, pain does not appear musculoskeletal.  Plan for troponin trending, additional lab work, chest x-ray, re-evaluate. With recurrent symptoms may benefit form Cardiology referral if enzymes are negative.   Spoke with Dr. Marlou Porch with Cardiology. Troponin at 150. No active pain. Will admit for likely left heart cath. Will start Heparin and give labetalol for elevated BP.   06:20 PM  Second troponin elevated to 270. Patient continues to be pain-free. Continue heparin and plan. Has a bed assigned at [facility]. Waiting on transportation.   Discussed patient's case with Cardiology to request admission. Patient and family (if present) updated with plan. Care transferred to Cardiology service.  I reviewed all nursing notes, vitals, pertinent old records, EKGs, labs, imaging (as available).  ____________________________________________  FINAL CLINICAL IMPRESSION(S) / ED DIAGNOSES  Final diagnoses:  NSTEMI (non-ST elevated myocardial infarction) (HCC)  Precordial  pain    MEDICATIONS GIVEN DURING THIS VISIT:  Medications  heparin ADULT infusion 100 units/mL (25000 units/220mL) (1,450 Units/hr Intravenous New Bag/Given 07/21/20 1754)  labetalol (NORMODYNE) injection 5 mg (5 mg Intravenous Given 07/21/20 1749)  heparin bolus via infusion 4,000 Units (4,000 Units Intravenous Bolus from Bag 07/21/20 1756)     Note:  This document was prepared using Dragon voice recognition software and  may include unintentional dictation errors.  Nanda Quinton, MD, [facility] Emergency Medicine    Sherel Fennell, [facility], MD 07/21/20 [phone]

## 2020-07-21 NOTE — Progress Notes (Signed)
ANTICOAGULATION CONSULT NOTE - Initial Consult  Pharmacy Consult for heparin Indication: chest pain/ACS  Allergies  Allergen Reactions   Prednisone     Patient Measurements: Height: 6' (182.9 cm) Weight: 130.6 kg (288 lb) IBW/kg (Calculated) : 77.6 Heparin Dosing Weight: 107 kg  Vital Signs: Temp: 98.7 F (37.1 C) (06/16 1509) Temp Source: Oral (06/16 1509) BP: 170/94 (06/16 1700) Pulse Rate: 76 (06/16 1700)  Labs: Recent Labs    07/21/20 1555  HGB 16.4  HCT 48.0  PLT 202  CREATININE 1.46*  TROPONINIHS 150*    Estimated Creatinine Clearance: 85.5 mL/min (A) (by C-G formula based on SCr of 1.46 mg/dL (H)).   Assessment: 50 yo M with CP x 45 minutes while working out. No prior AC. Troponin resulted elevated at 150 in ED.  Goal of Therapy:  Heparin level 0.3-0.7 units/ml Monitor platelets by anticoagulation protocol: Yes   Plan:  Give 4000 units bolus x 1 Start heparin infusion at 1450 units/hr --f/u 6h-HL at 0000 on 6/17 --Monitor daily HL, CBC, and any s/sx of bleeding  Joetta Manners, PharmD, [facility] Emergency Medicine Clinical Pharmacist  Please check AMION for all [place] phone numbers After 10:00 PM, call [place] [phone]

## 2020-07-21 NOTE — H&P (Signed)
Cardiology Admission History and Physical:   Patient ID: Roberto Morrison; MRN: [id number]; DOB: 01-30-1971   Admission date: 07/21/2020  Primary Care Provider: Patient, No Pcp Per (Inactive) Primary Cardiologist: Candee Furbish, MD   Chief Complaint:  Chest pain  History of Present Illness:   Roberto Morrison is a 50 y.o. male with no significant PMHx who presents with acute onset chest pain while working out. He was at the gym and developed central chest pain. It was squeezing in nature and did not radiate . He denied any associated dyspnea, palpitations, nausea or vomiting. He had broken into a sweat due to the exercise prior to the CP. He did not report any orthopnea, PND or leg swelling. The chest pain lasted for 45 minutes.  In the ED noted to be hypertensive, SBP 150s-170s.Heart rate was in the 70s. Chest pain free in the ED. Hs-Trops were 150, 270 and 679. ECG showed NSR, rate 82 bpm, LAFB and LVH with T wave abnormalities. CXR was unremarkable.  He does a lot of strength training and lifts weights regularly in the gym. He also walks every morning for 30-60 minutes. He has been noticing CP the past 1 week with heavy exertion. Symptoms are relieved with rest. He was previously using pre-workout drinks with stimulants/caffeine. He has now stopped these.  History reviewed. No pertinent past medical history.  No past surgical history on file.   Medications Prior to Admission: Prior to Admission medications   Not on File     Allergies:    Allergies  Allergen Reactions   Prednisone     Social History:   Social History   Socioeconomic History   Marital status: Single    Spouse name: Not on file   Number of children: Not on file   Years of education: Not on file   Highest education level: Not on file  Occupational History   Not on file  Tobacco Use   Smoking status: Never   Smokeless tobacco: Never  Substance and Sexual Activity   Alcohol use: Not Currently   Drug use: Never    Sexual activity: Not on file  Other Topics Concern   Not on file  Social History Narrative   Not on file   Social Determinants of Health   Financial Resource Strain: Not on file  Food Insecurity: Not on file  Transportation Needs: Not on file  Physical Activity: Not on file  Stress: Not on file  Social Connections: Not on file  Intimate Partner Violence: Not on file     Family History:  The patient's family history is not on file.     Review of Systems: [y] = yes, [ ]  = no   General: Weight gain [ ] ; Weight loss [ ] ; Anorexia [ ] ; Fatigue [ ] ; Fever [ ] ; Chills [ ] ; Weakness [ ]   Cardiac: Chest pain/pressure [Y]; Resting SOB [ ] ; Exertional SOB [ ] ; Orthopnea [ ] ; Pedal Edema [ ] ; Palpitations [ ] ; Syncope [ ] ; Presyncope [ ] ; Paroxysmal nocturnal dyspnea[ ]   Pulmonary: Cough [ ] ; Wheezing[ ] ; Hemoptysis[ ] ; Sputum [ ] ; Snoring [ ]   GI: Vomiting[ ] ; Dysphagia[ ] ; Melena[ ] ; Hematochezia [ ] ; Heartburn[ ] ; Abdominal pain [ ] ; Constipation [ ] ; Diarrhea [ ] ; BRBPR [ ]   GU: Hematuria[ ] ; Dysuria [ ] ; Nocturia[ ]   Vascular: Pain in legs with walking [ ] ; Pain in feet with lying flat [ ] ; Non-healing sores [ ] ; Stroke [ ] ; TIA [ ] ;  Slurred speech [ ] ;  Neuro: Headaches[ ] ; Vertigo[ ] ; Seizures[ ] ; Paresthesias[ ] ;Blurred vision [ ] ; Diplopia [ ] ; Vision changes [ ]   Ortho/Skin: Arthritis [ ] ; Joint pain [ ] ; Muscle pain [ ] ; Joint swelling [ ] ; Back Pain [ ] ; Rash [ ]   Psych: Depression[ ] ; Anxiety[ ]   Heme: Bleeding problems [ ] ; Clotting disorders [ ] ; Anemia [ ]   Endocrine: Diabetes [ ] ; Thyroid dysfunction[ ]      Physical Exam/Data:   Vitals:   07/21/20 1844 07/21/20 1930 07/21/20 2000 07/21/20 2048  BP: (!) 174/108 (!) 118/105 (!) 143/102 (!) 165/88  Pulse: 77 84 87 83  Resp: 16 17 (!) 23 20  Temp:   98.2 F (36.8 C) 97.7 F (36.5 C)  TempSrc:   Oral Oral  SpO2: 100% 100% 100% 99%  Weight:    129.5 kg  Height:    6' (1.829 m)   No intake or output data in the 24  hours ending 07/21/20 2054 Filed Weights   07/21/20 1506 07/21/20 2048  Weight: 130.6 kg 129.5 kg   Body mass index is 38.71 kg/m.  General:  Well nourished, well developed, in no acute distress HEENT: normal Lymph: no adenopathy Neck: no JVD Endocrine:  No thryomegaly Vascular: No carotid bruits; FA pulses 2+ bilaterally without bruits  Cardiac:  normal S1, S2; RRR; no murmur  Lungs:  clear to auscultation bilaterally, no wheezing, rhonchi or rales  Abd: soft, nontender, no hepatomegaly  Ext: no edema Musculoskeletal:  No deformities, BUE and BLE strength normal and equal Skin: warm and dry  Neuro:  CNs 2-12 intact, no focal abnormalities noted Psych:  Normal affect    Laboratory Data:  Chemistry Recent Labs  Lab 07/21/20 1555  NA 138  K 4.1  CL 104  CO2 27  GLUCOSE 111*  BUN 19  CREATININE 1.46*  CALCIUM 9.3  GFRNONAA 59*  ANIONGAP 7    No results for input(s): PROT, ALBUMIN, AST, ALT, ALKPHOS, BILITOT in the last 168 hours. Hematology Recent Labs  Lab 07/21/20 1555  WBC 8.0  RBC 5.16  HGB 16.4  HCT 48.0  MCV 93.0  MCH 31.8  MCHC 34.2  RDW 14.3  PLT 202   Cardiac EnzymesNo results for input(s): TROPONINI in the last 168 hours. No results for input(s): TROPIPOC in the last 168 hours.  BNPNo results for input(s): BNP, PROBNP in the last 168 hours.  DDimer No results for input(s): DDIMER in the last 168 hours.  Radiology/Studies:  DG Chest 2 View  Result Date: 07/21/2020 CLINICAL DATA:  Chest pain EXAM: CHEST - 2 VIEW COMPARISON:  12/28/2013 FINDINGS: The heart size and mediastinal contours are within normal limits. Both lungs are clear. The visualized skeletal structures are unremarkable. IMPRESSION: No active cardiopulmonary disease. Electronically Signed   By: Donavan Foil M.D.   On: 07/21/2020 15:48    Assessment and Plan:   Non-ST elevation myocardial infarction Patient presents with chest pain and is ruling in for an MI. Trend for hs-trops is  150, 270, 679 & 1161. He has been noticing exertional chest pain (usually after strength training/lifting weights) that is relieved with rest. Today had a longer episode. ECG showed LVH and T wave abnormalities.  -ASA daily -Unfractionated heparin IV -Check a lipid panel -Consider statin therapy -Transthoracic echocardiogram to evaluate LV function -NPO after midnight  Patient will likely need a cardiac catheterization procedure in the morning to delineate the coronary anatomy. The risks and benefits of the  procedure were discussed with him and he is willing to proceed with the test. Pre-procedure hydration has been ordered as well (initial Cr 1.46).     Severity of Illness: The appropriate patient status for this patient is INPATIENT. Inpatient status is judged to be reasonable and necessary in order to provide the required intensity of service to ensure the patient's safety. The patient's presenting symptoms, physical exam findings, and initial radiographic and laboratory data in the context of their chronic comorbidities is felt to place them at high risk for further clinical deterioration. Furthermore, it is not anticipated that the patient will be medically stable for discharge from the hospital within 2 midnights of admission. The following factors support the patient status of inpatient.   " The patient's presenting symptoms include chest pain. " The worrisome physical exam findings include NA. " The initial radiographic and laboratory data are worrisome because of elevated troponin. " The chronic co-morbidities include NA.   * I certify that at the point of admission it is my clinical judgment that the patient will require inpatient hospital care spanning beyond 2 midnights from the point of admission due to high intensity of service, high risk for further deterioration and high frequency of surveillance required.*   For questions or updates, please contact [place] Please  consult [email or web address] for contact info under Cardiology/STEMI.    Signed, Meade Maw, MD  07/21/2020 8:54 PM

## 2020-07-21 NOTE — ED Triage Notes (Cosign Needed Addendum)
Pt c/o CP x 45 min-states pain started while working out-NAD-steady gait-pt added he is concerned may be r/t to pre workout drinks-pt experienced similar episode last week

## 2020-07-21 NOTE — ED Notes (Signed)
Report called to Comptroller at [employer].

## 2020-07-21 NOTE — ED Notes (Signed)
Patient inquiring about leaving AMA; discussed benefit of admission for eval of Nstemi; discussed benefit of seeing a cardiologist inpatient; patient asking if he can leave after repeat troponin; Dr Laverta Baltimore aware and advised patient needs to discuss with cardiologist.

## 2020-07-22 ENCOUNTER — Inpatient Hospital Stay (HOSPITAL_COMMUNITY): Payer: 59

## 2020-07-22 ENCOUNTER — Encounter (HOSPITAL_COMMUNITY)
Admission: EM | Disposition: A | Discharge status: Home / Self Care | Payer: Self-pay | Source: Home / Self Care | Attending: Internal Medicine

## 2020-07-22 DIAGNOSIS — R079 Chest pain, unspecified: Secondary | ICD-10-CM

## 2020-07-22 DIAGNOSIS — I214 Non-ST elevation (NSTEMI) myocardial infarction: Secondary | ICD-10-CM | POA: Diagnosis not present

## 2020-07-22 DIAGNOSIS — I2510 Atherosclerotic heart disease of native coronary artery without angina pectoris: Secondary | ICD-10-CM | POA: Diagnosis not present

## 2020-07-22 HISTORY — PX: LEFT HEART CATH AND CORONARY ANGIOGRAPHY: CATH118249

## 2020-07-22 LAB — ECHOCARDIOGRAM COMPLETE
AR max vel: 3.16 cm2
AV Area VTI: 3.03 cm2
AV Area mean vel: 3.07 cm2
AV Mean grad: 3 mmHg
AV Peak grad: 6 mmHg
Ao pk vel: 1.22 m/s
Area-P 1/2: 3.77 cm2
Calc EF: 54.1 %
Height: 72 in
S' Lateral: 4.4 cm
Single Plane A2C EF: 59.5 %
Single Plane A4C EF: 46.4 %
Weight: 4566.4 oz

## 2020-07-22 LAB — TROPONIN I (HIGH SENSITIVITY): Troponin I (High Sensitivity): 1796 ng/L (ref <18)

## 2020-07-22 LAB — BASIC METABOLIC PANEL
Anion gap: 7 (ref 5–15)
BUN: 12 mg/dL (ref 6–20)
CO2: 27 mmol/L (ref 22–32)
Calcium: 9.2 mg/dL (ref 8.9–10.3)
Chloride: 106 mmol/L (ref 98–111)
Creatinine, Ser: 1.35 mg/dL — ABNORMAL HIGH (ref 0.61–1.24)
GFR, Estimated: >60 mL/min (ref >60)
Glucose, Bld: 94 mg/dL (ref 70–99)
Potassium: 4.2 mmol/L (ref 3.5–5.1)
Sodium: 140 mmol/L (ref 135–145)

## 2020-07-22 LAB — HEPARIN LEVEL (UNFRACTIONATED)
Heparin Unfractionated: <0.1 IU/mL — ABNORMAL LOW (ref 0.30–0.70)
Heparin Unfractionated: 0.16 IU/mL — ABNORMAL LOW (ref 0.30–0.70)

## 2020-07-22 LAB — HIV ANTIBODY (ROUTINE TESTING W REFLEX): HIV Screen 4th Generation wRfx: NONREACTIVE

## 2020-07-22 LAB — CBC
HCT: 44.9 % (ref 39.0–52.0)
Hemoglobin: 15.6 g/dL (ref 13.0–17.0)
MCH: 32.3 pg (ref 26.0–34.0)
MCHC: 34.7 g/dL (ref 30.0–36.0)
MCV: 93 fL (ref 80.0–100.0)
Platelets: 193 10*3/uL (ref 150–400)
RBC: 4.83 MIL/uL (ref 4.22–5.81)
RDW: 14.4 % (ref 11.5–15.5)
WBC: 8.3 10*3/uL (ref 4.0–10.5)
nRBC: 0 % (ref 0.0–0.2)

## 2020-07-22 SURGERY — LEFT HEART CATH AND CORONARY ANGIOGRAPHY
Anesthesia: LOCAL

## 2020-07-22 MED ORDER — ATORVASTATIN CALCIUM 80 MG PO TABS: 80.0000 mg | ORAL_TABLET | Freq: Every day | ORAL | Status: DC

## 2020-07-22 MED ORDER — SODIUM CHLORIDE 0.9 % WEIGHT BASED INFUSION: 3.0000 mL/kg/h | INTRAVENOUS | Status: DC

## 2020-07-22 MED ORDER — HEPARIN (PORCINE) 25000 UT/250ML-% IV SOLN
2200.0000 [IU]/h | INTRAVENOUS | Status: DC
  Administered 2020-07-23: 2000 [IU]/h via INTRAVENOUS
  Administered 2020-07-23: 1400 [IU]/h via INTRAVENOUS
  Administered 2020-07-24 – 2020-07-25 (×2): 2200 [IU]/h via INTRAVENOUS
  Filled 2020-07-22 (×6): qty 250

## 2020-07-22 MED ORDER — ACETAMINOPHEN 325 MG PO TABS: 650.0000 mg | ORAL_TABLET | ORAL | Status: DC | PRN

## 2020-07-22 MED ORDER — VERAPAMIL HCL 2.5 MG/ML IV SOLN
INTRA_ARTERIAL | Status: DC | PRN
  Administered 2020-07-22: 15 mL via INTRA_ARTERIAL

## 2020-07-22 MED ORDER — SODIUM CHLORIDE 0.9% FLUSH
3.0000 mL | Freq: Two times a day (BID) | INTRAVENOUS | Status: DC
  Administered 2020-07-22 – 2020-07-25 (×3): 3 mL via INTRAVENOUS

## 2020-07-22 MED ORDER — HEPARIN (PORCINE) IN NACL 1000-0.9 UT/500ML-% IV SOLN
INTRAVENOUS | Status: AC
  Filled 2020-07-22: qty 1000

## 2020-07-22 MED ORDER — ASPIRIN 81 MG PO CHEW: 81.0000 mg | CHEWABLE_TABLET | ORAL | Status: DC

## 2020-07-22 MED ORDER — FENTANYL CITRATE (PF) 100 MCG/2ML IJ SOLN
INTRAMUSCULAR | Status: AC
  Filled 2020-07-22: qty 2

## 2020-07-22 MED ORDER — SODIUM CHLORIDE 0.9% FLUSH
3.0000 mL | Freq: Two times a day (BID) | INTRAVENOUS | Status: DC
  Administered 2020-07-23 – 2020-07-25 (×5): 3 mL via INTRAVENOUS

## 2020-07-22 MED ORDER — SODIUM CHLORIDE 0.9 % WEIGHT BASED INFUSION
3.0000 mL/kg/h | INTRAVENOUS | Status: DC
  Administered 2020-07-22: 3 mL/kg/h via INTRAVENOUS

## 2020-07-22 MED ORDER — SODIUM CHLORIDE 0.9 % WEIGHT BASED INFUSION: 1.0000 mL/kg/h | INTRAVENOUS | Status: DC

## 2020-07-22 MED ORDER — HYDRALAZINE HCL 20 MG/ML IJ SOLN: 10.0000 mg | INTRAMUSCULAR | Status: AC | PRN

## 2020-07-22 MED ORDER — MIDAZOLAM HCL 2 MG/2ML IJ SOLN
INTRAMUSCULAR | Status: DC | PRN
  Administered 2020-07-22: 1 mg via INTRAVENOUS

## 2020-07-22 MED ORDER — SODIUM CHLORIDE 0.9 % WEIGHT BASED INFUSION
1.0000 mL/kg/h | INTRAVENOUS | Status: DC
  Administered 2020-07-22: 1 mL/kg/h via INTRAVENOUS

## 2020-07-22 MED ORDER — SODIUM CHLORIDE 0.9 % IV SOLN: 250.0000 mL | INTRAVENOUS | Status: DC | PRN

## 2020-07-22 MED ORDER — HEPARIN (PORCINE) IN NACL 1000-0.9 UT/500ML-% IV SOLN
INTRAVENOUS | Status: DC | PRN
  Administered 2020-07-22 (×2): 500 mL

## 2020-07-22 MED ORDER — MIDAZOLAM HCL 2 MG/2ML IJ SOLN
INTRAMUSCULAR | Status: AC
  Filled 2020-07-22: qty 2

## 2020-07-22 MED ORDER — HEPARIN SODIUM (PORCINE) 1000 UNIT/ML IJ SOLN
INTRAMUSCULAR | Status: AC
  Filled 2020-07-22: qty 1

## 2020-07-22 MED ORDER — HEPARIN SODIUM (PORCINE) 1000 UNIT/ML IJ SOLN
INTRAMUSCULAR | Status: DC | PRN
  Administered 2020-07-22: 6500 [IU] via INTRAVENOUS

## 2020-07-22 MED ORDER — ONDANSETRON HCL 4 MG/2ML IJ SOLN: 4.0000 mg | Freq: Four times a day (QID) | INTRAMUSCULAR | Status: DC | PRN

## 2020-07-22 MED ORDER — MORPHINE SULFATE (PF) 2 MG/ML IV SOLN
2.0000 mg | INTRAVENOUS | Status: DC | PRN
  Administered 2020-07-25: 2 mg via INTRAVENOUS
  Filled 2020-07-22: qty 1

## 2020-07-22 MED ORDER — FENTANYL CITRATE (PF) 100 MCG/2ML IJ SOLN
INTRAMUSCULAR | Status: DC | PRN
  Administered 2020-07-22: 25 ug via INTRAVENOUS

## 2020-07-22 MED ORDER — IOHEXOL 350 MG/ML SOLN
INTRAVENOUS | Status: DC | PRN
  Administered 2020-07-22: 80 mL via INTRA_ARTERIAL

## 2020-07-22 MED ORDER — LIDOCAINE HCL (PF) 1 % IJ SOLN
INTRAMUSCULAR | Status: AC
  Filled 2020-07-22: qty 30

## 2020-07-22 MED ORDER — SODIUM CHLORIDE 0.9% FLUSH: 3.0000 mL | INTRAVENOUS | Status: DC | PRN

## 2020-07-22 MED ORDER — SODIUM CHLORIDE 0.9 % IV SOLN: INTRAVENOUS | Status: AC

## 2020-07-22 MED ORDER — HEPARIN BOLUS VIA INFUSION
2000.0000 [IU] | Freq: Once | INTRAVENOUS | Status: AC
  Administered 2020-07-22: 2000 [IU] via INTRAVENOUS
  Filled 2020-07-22: qty 2000

## 2020-07-22 MED ORDER — ASPIRIN 81 MG PO CHEW
81.0000 mg | CHEWABLE_TABLET | Freq: Every day | ORAL | Status: DC
  Administered 2020-07-22: 81 mg via ORAL
  Filled 2020-07-22: qty 1

## 2020-07-22 MED ORDER — LIDOCAINE HCL (PF) 1 % IJ SOLN
INTRAMUSCULAR | Status: DC | PRN
  Administered 2020-07-22: 2 mL via INTRADERMAL

## 2020-07-22 MED ORDER — LABETALOL HCL 5 MG/ML IV SOLN: 10.0000 mg | INTRAVENOUS | Status: AC | PRN

## 2020-07-22 MED ORDER — HEPARIN BOLUS VIA INFUSION
1500.0000 [IU] | Freq: Once | INTRAVENOUS | Status: AC
  Administered 2020-07-22: 1500 [IU] via INTRAVENOUS
  Filled 2020-07-22: qty 1500

## 2020-07-22 MED ORDER — NITROGLYCERIN 1 MG/10 ML FOR IR/CATH LAB
INTRA_ARTERIAL | Status: AC
  Filled 2020-07-22: qty 10

## 2020-07-22 SURGICAL SUPPLY — 12 items
CATH INFINITI JR4 5F (CATHETERS) ×2 IMPLANT
CATH LAUNCHER 5F JR4 (CATHETERS) ×2 IMPLANT
CATH OPTITORQUE TIG 4.0 5F (CATHETERS) ×2 IMPLANT
DEVICE RAD COMP TR BAND LRG (VASCULAR PRODUCTS) ×2 IMPLANT
GLIDESHEATH SLEND A-KIT 6F 22G (SHEATH) ×2 IMPLANT
GUIDEWIRE INQWIRE 1.5J.035X260 (WIRE) ×1 IMPLANT
INQWIRE 1.5J .035X260CM (WIRE) ×2
KIT HEART LEFT (KITS) ×2 IMPLANT
PACK CARDIAC CATHETERIZATION (CUSTOM PROCEDURE TRAY) ×2 IMPLANT
TRANSDUCER W/STOPCOCK (MISCELLANEOUS) ×2 IMPLANT
TUBING CIL FLEX 10 FLL-RA (TUBING) ×2 IMPLANT
WIRE HI TORQ VERSACORE-J 145CM (WIRE) ×2 IMPLANT

## 2020-07-22 NOTE — Interval H&P Note (Signed)
Cath Lab Visit (complete for each Cath Lab visit)  Clinical Evaluation Leading to the Procedure:   ACS: Yes.    Non-ACS:    Anginal Classification: CCS II  Anti-ischemic medical therapy: No Therapy  Non-Invasive Test Results: No non-invasive testing performed  Prior CABG: No previous CABG      History and Physical Interval Note:  07/22/2020 3:03 PM  Roberto Morrison  has presented today for surgery, with the diagnosis of nstemi.  The various methods of treatment have been discussed with the patient and family. After consideration of risks, benefits and other options for treatment, the patient has consented to  Procedure(s): LEFT HEART CATH AND CORONARY ANGIOGRAPHY (N/A) as a surgical intervention.  The patient's history has been reviewed, patient examined, no change in status, stable for surgery.  I have reviewed the patient's chart and labs.  Questions were answered to the patient's satisfaction.     Quay Burow

## 2020-07-22 NOTE — Progress Notes (Signed)
[place] for heparin Indication: chest pain/ACS  Allergies  Allergen Reactions   Prednisone     Patient Measurements: Height: 6' (182.9 cm) Weight: 129.5 kg (285 lb 6.4 oz) IBW/kg (Calculated) : 77.6 Heparin Dosing Weight: 107 kg  Vital Signs: Temp: 98 F (36.7 C) (06/16 2345) Temp Source: Oral (06/16 2345) BP: 131/74 (06/16 2345) Pulse Rate: 84 (06/16 2345)  Labs: Recent Labs    07/21/20 1555 07/21/20 1739 07/21/20 1951 07/21/20 2213 07/22/20 0028  HGB 16.4  --   --  16.2  --   HCT 48.0  --   --  46.7  --   PLT 202  --   --  207  --   APTT  --  26  --   --   --   LABPROT  --  14.0  --   --   --   INR  --  1.1  --   --   --   HEPARINUNFRC  --   --   --   --  <0.10*  CREATININE 1.46*  --   --  1.31*  --   TROPONINIHS 150* 270* 679* 1,161*  --      Estimated Creatinine Clearance: 94.9 mL/min (A) (by C-G formula based on SCr of 1.31 mg/dL (H)).   Assessment: 50 yo M with CP x 45 minutes while working out. No prior AC. Troponin resulted elevated at 150 in ED.   6/17 AM update:  Heparin level low Troponin is rising   Goal of Therapy:  Heparin level 0.3-0.7 units/ml Monitor platelets by anticoagulation protocol: Yes   Plan:  Heparin 2000 units re-bol[employer] heparin to 1650 units/hr 1000 heparin level  Narda Bonds, PharmD, BCPS Clinical Pharmacist Phone: [phone]

## 2020-07-22 NOTE — Progress Notes (Signed)
*  PRELIMINARY RESULTS* Echocardiogram 2D Echocardiogram has been performed.  Luisa Hart RDCS 07/22/2020, 12:38 PM

## 2020-07-22 NOTE — Progress Notes (Signed)
ANTICOAGULATION CONSULT NOTE   Pharmacy Consult for heparin Indication: chest pain/ACS  Allergies  Allergen Reactions   Prednisone     Patient Measurements: Height: 6' (182.9 cm) Weight: 129.5 kg (285 lb 6.4 oz) IBW/kg (Calculated) : 77.6 Heparin Dosing Weight: 107 kg  Vital Signs: Temp: 97.7 F (36.5 C) (06/17 0738) Temp Source: Oral (06/17 0738) BP: 138/80 (06/17 0942) Pulse Rate: 86 (06/17 0942)  Labs: Recent Labs    07/21/20 1555 07/21/20 1739 07/21/20 1951 07/21/20 2213 07/22/20 0028 07/22/20 0237 07/22/20 0920  HGB 16.4  --   --  16.2  --  15.6  --   HCT 48.0  --   --  46.7  --  44.9  --   PLT 202  --   --  207  --  193  --   APTT  --  26  --   --   --   --   --   LABPROT  --  14.0  --   --   --   --   --   INR  --  1.1  --   --   --   --   --   HEPARINUNFRC  --   --   --   --  <0.10*  --  0.16*  CREATININE 1.46*  --   --  1.31*  --   --   --   TROPONINIHS 150* 270* 679* 1,161* 1,796*  --   --      Estimated Creatinine Clearance: 94.9 mL/min (A) (by C-G formula based on SCr of 1.31 mg/dL (H)).   Assessment: 50 yo M with CP x 45 minutes while working out. No prior AC. Troponins elevated, ruled in for NSTEMI. Plan is for cath today.  Heparin level is subtherapeutic at 0.16 on 1650 units/hr. No bleeding noted, CBC is stable. No bleeding or infusion issued noted per RN.   Goal of Therapy:  Heparin level 0.3-0.7 units/ml Monitor platelets by anticoagulation protocol: Yes   Plan:  Heparin 1500 units re-bolus Increase heparin drip to 1950 units/hr 6h heparin level or f/u after cath  Thank you for involving pharmacy in this patient's care.  Renold Genta, PharmD, BCPS Clinical Pharmacist Clinical phone for 07/22/2020 until 12p is [phone] 07/22/2020 10:18 AM  **Pharmacist phone directory can be found on [email or web address] listed under [place]**

## 2020-07-22 NOTE — Plan of Care (Signed)
  Problem: Education: Goal: Knowledge of General Education information will improve Description: Including pain rating scale, medication(s)/side effects and non-pharmacologic comfort measures Outcome: Progressing   Problem: Health Behavior/Discharge Planning: Goal: Ability to manage health-related needs will improve Outcome: Progressing   Problem: Activity: Goal: Risk for activity intolerance will decrease Outcome: Progressing   Problem: Nutrition: Goal: Adequate nutrition will be maintained Outcome: Progressing   Problem: Coping: Goal: Level of anxiety will decrease Outcome: Progressing   Problem: Elimination: Goal: Will not experience complications related to bowel motility Outcome: Progressing Goal: Will not experience complications related to urinary retention Outcome: Progressing   Problem: Pain Managment: Goal: General experience of comfort will improve Outcome: Progressing   Problem: Skin Integrity: Goal: Risk for impaired skin integrity will decrease Outcome: Progressing   Problem: Education: Goal: Understanding of CV disease, CV risk reduction, and recovery process will improve Outcome: Progressing   Problem: Cardiovascular: Goal: Ability to achieve and maintain adequate cardiovascular perfusion will improve Outcome: Progressing Goal: Vascular access site(s) Level 0-1 will be maintained Outcome: Progressing   Problem: Health Behavior/Discharge Planning: Goal: Ability to safely manage health-related needs after discharge will improve Outcome: Progressing

## 2020-07-22 NOTE — Progress Notes (Signed)
Education given on site care. Pt's bp also ranging in 160-180s. Pt asymptomatic. Only way to get bp in 160s is on pt's wrist. Per pt this happens all the time due to his size.

## 2020-07-22 NOTE — Progress Notes (Signed)
[place] for heparin Indication: chest pain/ACS  Allergies  Allergen Reactions   Prednisone Other (See Comments)    "Hiccups"     Patient Measurements: Height: 6' (182.9 cm) Weight: 129.5 kg (285 lb 6.4 oz) IBW/kg (Calculated) : 77.6 Heparin Dosing Weight: 107 kg  Vital Signs: Temp: 98 F (36.7 C) (06/17 1636) Temp Source: Oral (06/17 1636) BP: 180/83 (06/17 1751) Pulse Rate: 87 (06/17 1751)  Labs: Recent Labs    07/21/20 1555 07/21/20 1739 07/21/20 1951 07/21/20 2213 07/22/20 0028 07/22/20 0237 07/22/20 0920  HGB 16.4  --   --  16.2  --  15.6  --   HCT 48.0  --   --  46.7  --  44.9  --   PLT 202  --   --  207  --  193  --   APTT  --  26  --   --   --   --   --   LABPROT  --  14.0  --   --   --   --   --   INR  --  1.1  --   --   --   --   --   HEPARINUNFRC  --   --   --   --  <0.10*  --  0.16*  CREATININE 1.46*  --   --  1.31*  --   --  1.35*  TROPONINIHS 150* 270* 679* 1,161* 1,796*  --   --      Estimated Creatinine Clearance: 92.1 mL/min (A) (by C-G formula based on SCr of 1.35 mg/dL (H)).   Assessment: 50 yo M with CP x 45 minutes while working out. No prior AC. Troponins elevated, ruled in for NSTEMI. Plan is for cath today.  S/p cath multivessel CAD plant TCTS eval Resume heparin 4hr after sheath removed - out at 4pm resume at 8pm  Goal of Therapy:  Heparin level 0.3-0.7 units/ml Monitor platelets by anticoagulation protocol: Yes   Plan:  Resume Heparin drip at 1400 units/hr  Daily aptt, heparin level, cbc    Bonnita Nasuti Pharm.D. CPP, BCPS Clinical Pharmacist [phone] 07/22/2020 7:34 PM   **Pharmacist phone directory can be found on [email or web address] listed under [email or web address]**

## 2020-07-22 NOTE — H&P (View-Only) (Signed)
Progress Note  Patient Name: Roberto Morrison Date of Encounter: 07/22/2020  [facility] Cardiologist: Quay Burow, MD   Subjective   No chest pain this morning. Planned for cath today.   Inpatient Medications    Scheduled Meds:  amLODipine  5 mg Oral Daily   aspirin EC  81 mg Oral Daily   atorvastatin  80 mg Oral Daily   metoprolol tartrate  12.5 mg Oral BID   Continuous Infusions:  sodium chloride 50 mL/hr at 07/22/20 0659   heparin 1,650 Units/hr (07/22/20 0659)   PRN Meds: acetaminophen, nitroGLYCERIN, ondansetron (ZOFRAN) IV   Vital Signs    Vitals:   07/21/20 2204 07/21/20 2345 07/22/20 0418 07/22/20 0738  BP: (!) 122/95 131/74 (!) 108/43 125/85  Pulse: 81 84 79 85  Resp: 18 17 17 16   Temp:  98 F (36.7 C) 98 F (36.7 C) 97.7 F (36.5 C)  TempSrc:  Oral Oral Oral  SpO2: 94% 98% 97% 96%  Weight:      Height:        Intake/Output Summary (Last 24 hours) at 07/22/2020 0927 Last data filed at 07/22/2020 0659 Gross per 24 hour  Intake 723.54 ml  Output --  Net 723.54 ml   Last 3 Weights 07/21/2020 07/21/2020 12/28/2013  Weight (lbs) 285 lb 6.4 oz 288 lb 275 lb  Weight (kg) 129.457 kg 130.636 kg 124.739 kg      Telemetry    SR - Personally Reviewed  ECG    NSR, LVH, nonspecific changes - Personally Reviewed  Physical Exam   GEN: No acute distress. Very fit male WM  Neck: No JVD Cardiac: RRR, no murmurs, rubs, or gallops.  Respiratory: Clear to auscultation bilaterally. GI: Soft, nontender, non-distended  MS: No edema; No deformity. Neuro:  Nonfocal  Psych: Normal affect   Labs    High Sensitivity Troponin:   Recent Labs  Lab 07/21/20 1555 07/21/20 1739 07/21/20 1951 07/21/20 2213 07/22/20 0028  TROPONINIHS 150* 270* 679* 1,161* 1,796*      Chemistry Recent Labs  Lab 07/21/20 1555 07/21/20 2213  NA 138 137  K 4.1 3.5  CL 104 104  CO2 27 26  GLUCOSE 111* 138*  BUN 19 15  CREATININE 1.46* 1.31*  CALCIUM 9.3 9.0   GFRNONAA 59* >60  ANIONGAP 7 7     Hematology Recent Labs  Lab 07/21/20 1555 07/21/20 2213 07/22/20 0237  WBC 8.0 9.0 8.3  RBC 5.16 4.96 4.83  HGB 16.4 16.2 15.6  HCT 48.0 46.7 44.9  MCV 93.0 94.2 93.0  MCH 31.8 32.7 32.3  MCHC 34.2 34.7 34.7  RDW 14.3 14.5 14.4  PLT 202 207 193    BNPNo results for input(s): BNP, PROBNP in the last 168 hours.   DDimer No results for input(s): DDIMER in the last 168 hours.   Radiology    DG Chest 2 View  Result Date: 07/21/2020 CLINICAL DATA:  Chest pain EXAM: CHEST - 2 VIEW COMPARISON:  12/28/2013 FINDINGS: The heart size and mediastinal contours are within normal limits. Both lungs are clear. The visualized skeletal structures are unremarkable. IMPRESSION: No active cardiopulmonary disease. Electronically Signed   By: Donavan Foil M.D.   On: 07/21/2020 15:48    Cardiac Studies   Echo: pending  Patient Profile     50 y.o. male with no PMH who presented with chest pain after being at the gym and noted to have NSTEMI.   Assessment & Plan    NSTEMI:  hsTn rose to 1796. EKG with non specific changes. Only had 26min episode of chest pain after working out at [employer], no recurrence. Has had 2 other brief episodes in the past. Was seen at [facility] in the past for the same with negative work up. Denies any PMH and not on any medications.  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  EKG showed no acute changes.  He said no recurrent symptoms.  He is not having heparin Gwenlyn Found.  Plan for coronary angiography today. The patient understands that risks included but are not limited to stroke (1 in  1000), death (1 in 68), kidney failure [usually temporary] (1 in 500), bleeding (1 in 200), allergic reaction [possibly serious] (1 in 200).  The patient understands and agrees to proceed    Lorretta Harp, M.D., [place], [facility], [place], [place] [address] [place], [place]  [address]  [phone] 07/22/2020 11:59 AM

## 2020-07-22 NOTE — Progress Notes (Addendum)
Progress Note  Patient Name: Roberto Morrison Date of Encounter: 07/22/2020  [facility] Cardiologist: Quay Burow, MD   Subjective   No chest pain this morning. Planned for cath today.   Inpatient Medications    Scheduled Meds:  amLODipine  5 mg Oral Daily   aspirin EC  81 mg Oral Daily   atorvastatin  80 mg Oral Daily   metoprolol tartrate  12.5 mg Oral BID   Continuous Infusions:  sodium chloride 50 mL/hr at 07/22/20 0659   heparin 1,650 Units/hr (07/22/20 0659)   PRN Meds: acetaminophen, nitroGLYCERIN, ondansetron (ZOFRAN) IV   Vital Signs    Vitals:   07/21/20 2204 07/21/20 2345 07/22/20 0418 07/22/20 0738  BP: (!) 122/95 131/74 (!) 108/43 125/85  Pulse: 81 84 79 85  Resp: 18 17 17 16   Temp:  98 F (36.7 C) 98 F (36.7 C) 97.7 F (36.5 C)  TempSrc:  Oral Oral Oral  SpO2: 94% 98% 97% 96%  Weight:      Height:        Intake/Output Summary (Last 24 hours) at 07/22/2020 0927 Last data filed at 07/22/2020 0659 Gross per 24 hour  Intake 723.54 ml  Output --  Net 723.54 ml   Last 3 Weights 07/21/2020 07/21/2020 12/28/2013  Weight (lbs) 285 lb 6.4 oz 288 lb 275 lb  Weight (kg) 129.457 kg 130.636 kg 124.739 kg      Telemetry    SR - Personally Reviewed  ECG    NSR, LVH, nonspecific changes - Personally Reviewed  Physical Exam   GEN: No acute distress. Very fit male WM  Neck: No JVD Cardiac: RRR, no murmurs, rubs, or gallops.  Respiratory: Clear to auscultation bilaterally. GI: Soft, nontender, non-distended  MS: No edema; No deformity. Neuro:  Nonfocal  Psych: Normal affect   Labs    High Sensitivity Troponin:   Recent Labs  Lab 07/21/20 1555 07/21/20 1739 07/21/20 1951 07/21/20 2213 07/22/20 0028  TROPONINIHS 150* 270* 679* 1,161* 1,796*      Chemistry Recent Labs  Lab 07/21/20 1555 07/21/20 2213  NA 138 137  K 4.1 3.5  CL 104 104  CO2 27 26  GLUCOSE 111* 138*  BUN 19 15  CREATININE 1.46* 1.31*  CALCIUM 9.3 9.0   GFRNONAA 59* >60  ANIONGAP 7 7     Hematology Recent Labs  Lab 07/21/20 1555 07/21/20 2213 07/22/20 0237  WBC 8.0 9.0 8.3  RBC 5.16 4.96 4.83  HGB 16.4 16.2 15.6  HCT 48.0 46.7 44.9  MCV 93.0 94.2 93.0  MCH 31.8 32.7 32.3  MCHC 34.2 34.7 34.7  RDW 14.3 14.5 14.4  PLT 202 207 193    BNPNo results for input(s): BNP, PROBNP in the last 168 hours.   DDimer No results for input(s): DDIMER in the last 168 hours.   Radiology    DG Chest 2 View  Result Date: 07/21/2020 CLINICAL DATA:  Chest pain EXAM: CHEST - 2 VIEW COMPARISON:  12/28/2013 FINDINGS: The heart size and mediastinal contours are within normal limits. Both lungs are clear. The visualized skeletal structures are unremarkable. IMPRESSION: No active cardiopulmonary disease. Electronically Signed   By: Donavan Foil M.D.   On: 07/21/2020 15:48    Cardiac Studies   Echo: pending  Patient Profile     50 y.o. male with no PMH who presented with chest pain after being at the gym and noted to have NSTEMI.   Assessment & Plan    NSTEMI:  hsTn rose to 1796. EKG with non specific changes. Only had 13min episode of chest pain after working out at [employer], no recurrence. Has had 2 other brief episodes in the past. Was seen at [facility] in the past for the same with negative work up. Denies any PMH and not on any medications.  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  EKG showed no acute changes.  He said no recurrent symptoms.  He is not having heparin Gwenlyn Found.  Plan for coronary angiography today. The patient understands that risks included but are not limited to stroke (1 in  1000), death (1 in 75), kidney failure [usually temporary] (1 in 500), bleeding (1 in 200), allergic reaction [possibly serious] (1 in 200).  The patient understands and agrees to proceed    Lorretta Harp, M.D., [place], [facility], [place], [place] [address] [place],   [address]  [phone] 07/22/2020 11:59 AM

## 2020-07-23 DIAGNOSIS — I214 Non-ST elevation (NSTEMI) myocardial infarction: Secondary | ICD-10-CM | POA: Diagnosis not present

## 2020-07-23 DIAGNOSIS — I5021 Acute systolic (congestive) heart failure: Secondary | ICD-10-CM

## 2020-07-23 LAB — CBC
HCT: 45.9 % (ref 39.0–52.0)
Hemoglobin: 15.6 g/dL (ref 13.0–17.0)
MCH: 32.3 pg (ref 26.0–34.0)
MCHC: 34 g/dL (ref 30.0–36.0)
MCV: 95 fL (ref 80.0–100.0)
Platelets: 181 10*3/uL (ref 150–400)
RBC: 4.83 MIL/uL (ref 4.22–5.81)
RDW: 14.7 % (ref 11.5–15.5)
WBC: 6.8 10*3/uL (ref 4.0–10.5)
nRBC: 0 % (ref 0.0–0.2)

## 2020-07-23 LAB — BASIC METABOLIC PANEL
Anion gap: 8 (ref 5–15)
BUN: 13 mg/dL (ref 6–20)
CO2: 24 mmol/L (ref 22–32)
Calcium: 8.7 mg/dL — ABNORMAL LOW (ref 8.9–10.3)
Chloride: 108 mmol/L (ref 98–111)
Creatinine, Ser: 1.44 mg/dL — ABNORMAL HIGH (ref 0.61–1.24)
GFR, Estimated: 60 mL/min — ABNORMAL LOW (ref >60)
Glucose, Bld: 119 mg/dL — ABNORMAL HIGH (ref 70–99)
Potassium: 4 mmol/L (ref 3.5–5.1)
Sodium: 140 mmol/L (ref 135–145)

## 2020-07-23 LAB — SURGICAL PCR SCREEN
MRSA, PCR: NEGATIVE
Staphylococcus aureus: POSITIVE — AB

## 2020-07-23 LAB — HEPARIN LEVEL (UNFRACTIONATED)
Heparin Unfractionated: <0.1 IU/mL — ABNORMAL LOW (ref 0.30–0.70)
Heparin Unfractionated: 0.18 IU/mL — ABNORMAL LOW (ref 0.30–0.70)

## 2020-07-23 MED ORDER — CARVEDILOL 6.25 MG PO TABS
6.2500 mg | ORAL_TABLET | Freq: Two times a day (BID) | ORAL | Status: DC
  Administered 2020-07-23 – 2020-07-25 (×6): 6.25 mg via ORAL
  Filled 2020-07-23 (×6): qty 1

## 2020-07-23 MED ORDER — ISOSORB DINITRATE-HYDRALAZINE 20-37.5 MG PO TABS
1.0000 | ORAL_TABLET | Freq: Two times a day (BID) | ORAL | Status: DC
  Administered 2020-07-23 (×2): 1 via ORAL
  Filled 2020-07-23 (×2): qty 1

## 2020-07-23 NOTE — Progress Notes (Signed)
Roberto Morrison for heparin Indication: chest pain/ACS  Allergies  Allergen Reactions   Prednisone Other (See Comments)    "Hiccups"     Patient Measurements: Height: 6' (182.9 cm) Weight: 129.5 kg (285 lb 6.4 oz) IBW/kg (Calculated) : 77.6 Heparin Dosing Weight: 107 kg  Vital Signs: Temp: 98.3 F (36.8 C) (06/18 0610) Temp Source: Oral (06/18 0610) BP: 152/104 (06/18 0610) Pulse Rate: 85 (06/18 0610)  Labs: Recent Labs    07/21/20 1555 07/21/20 1739 07/21/20 1951 07/21/20 2213 07/22/20 0028 07/22/20 0237 07/22/20 0920 07/23/20 0605  HGB 16.4  --   --  16.2  --  15.6  --   --   HCT 48.0  --   --  46.7  --  44.9  --   --   PLT 202  --   --  207  --  193  --   --   APTT  --  26  --   --   --   --   --   --   LABPROT  --  14.0  --   --   --   --   --   --   INR  --  1.1  --   --   --   --   --   --   HEPARINUNFRC  --   --   --   --  <0.10*  --  0.16* <0.10*  CREATININE 1.46*  --   --  1.31*  --   --  1.35* 1.44*  TROPONINIHS 150* 270* 679* 1,161* 1,796*  --   --   --      Estimated Creatinine Clearance: 86.4 mL/min (A) (by C-G formula based on SCr of 1.44 mg/dL (H)).   Assessment: 50 y.o. male with CAD s/p cath, awaiting possible CABG, for heparin  Goal of Therapy:  Heparin level 0.3-0.7 units/ml Monitor platelets by anticoagulation protocol: Yes   Plan:  Increase Heparin 1800 units/hr Check heparin level in 8 hours.  Roberto Morrison, PharmD, BCPS

## 2020-07-23 NOTE — Consult Note (Signed)
TCTS Preliminary Consult Note  Asked to see patient regarding severe 3 V CAD. I have personally reviewed films and agree with recommendation for CABG as best therapy given his young age and severity of LAD disease particularly. Will complete routine workup and plan for surgery Tues.  All questions answered to his satisfaction at present. Full consult note to follow. Ariany Kesselman Z. Orvan Seen, [place]

## 2020-07-23 NOTE — Progress Notes (Signed)
[place] for heparin Indication: chest pain/ACS  Allergies  Allergen Reactions   Prednisone Other (See Comments)    "Hiccups"     Patient Measurements: Height: 6' (182.9 cm) Weight: 129.5 kg (285 lb 6.4 oz) IBW/kg (Calculated) : 77.6 Heparin Dosing Weight: 107 kg  Vital Signs: Temp: 97.8 F (36.6 C) (06/18 1100) Temp Source: Oral (06/18 1100) BP: 128/63 (06/18 1100) Pulse Rate: 84 (06/18 1100)  Labs: Recent Labs    07/21/20 1739 07/21/20 1951 07/21/20 2213 07/21/20 2213 07/22/20 0028 07/22/20 0237 07/22/20 0920 07/23/20 0605 07/23/20 1623  HGB  --   --  16.2  --   --  15.6  --  15.6  --   HCT  --   --  46.7  --   --  44.9  --  45.9  --   PLT  --   --  207  --   --  193  --  181  --   APTT 26  --   --   --   --   --   --   --   --   LABPROT 14.0  --   --   --   --   --   --   --   --   INR 1.1  --   --   --   --   --   --   --   --   HEPARINUNFRC  --   --   --    < > <0.10*  --  0.16* <0.10* 0.18*  CREATININE  --   --  1.31*  --   --   --  1.35* 1.44*  --   TROPONINIHS 270* 679* 1,161*  --  1,796*  --   --   --   --    < > = values in this interval not displayed.     Estimated Creatinine Clearance: 86.4 mL/min (A) (by C-G formula based on SCr of 1.44 mg/dL (H)).   Assessment: 50 y.o. male with CAD s/p cath, awaiting possible CABG, for heparin  Heparin level remains subtherapeutic s/p rate increase to 1800 units/hr, plans for CABG Tuesday  Goal of Therapy:  Heparin level 0.3-0.7 units/ml Monitor platelets by anticoagulation protocol: Yes   Plan:  Increase heparin gtt to 2000 units/hr F/u 6 hour heparin level  Bertis Ruddy, PharmD Clinical Pharmacist ED Pharmacist Phone # [phone] 07/23/2020 5:24 PM

## 2020-07-23 NOTE — Progress Notes (Signed)
Progress Note  Patient Name: Roberto Morrison Date of Encounter: 07/23/2020  [facility] Cardiologist: Quay Burow, MD   Subjective  No complaints  Inpatient Medications    Scheduled Meds:  amLODipine  5 mg Oral Daily   atorvastatin  80 mg Oral Daily   carvedilol  6.25 mg Oral BID WC   isosorbide-hydrALAZINE  1 tablet Oral BID   sodium chloride flush  3 mL Intravenous Q12H   sodium chloride flush  3 mL Intravenous Q12H   Continuous Infusions:  sodium chloride     heparin 1,400 Units/hr (07/23/20 0240)   PRN Meds: sodium chloride, acetaminophen, morphine injection, nitroGLYCERIN, ondansetron (ZOFRAN) IV, sodium chloride flush   Vital Signs    Vitals:   07/22/20 1721 07/22/20 1751 07/22/20 2116 07/23/20 0610  BP: (!) 165/76 (!) 180/83 (!) 166/84 (!) 152/104  Pulse: 76 87 91 85  Resp: 18 16  15   Temp:    98.3 F (36.8 C)  TempSrc:    Oral  SpO2: 98% 96%  96%  Weight:      Height:        Intake/Output Summary (Last 24 hours) at 07/23/2020 0827 Last data filed at 07/23/2020 0400 Gross per 24 hour  Intake 96.28 ml  Output 850 ml  Net -753.72 ml   Last 3 Weights 07/21/2020 07/21/2020 12/28/2013  Weight (lbs) 285 lb 6.4 oz 288 lb 275 lb  Weight (kg) 129.457 kg 130.636 kg 124.739 kg      Telemetry   SR- Personally Reviewed  ECG    N/a - Personally Reviewed  Physical Exam   GEN: No acute distress.   Neck: No JVD Cardiac: RRR, no murmurs, rubs, or gallops.  Respiratory: Clear to auscultation bilaterally. GI: Soft, nontender, non-distended  MS: No edema; No deformity. Neuro:  Nonfocal  Psych: Normal affect   Labs    High Sensitivity Troponin:   Recent Labs  Lab 07/21/20 1555 07/21/20 1739 07/21/20 1951 07/21/20 2213 07/22/20 0028  TROPONINIHS 150* 270* 679* 1,161* 1,796*      Chemistry Recent Labs  Lab 07/21/20 2213 07/22/20 0920 07/23/20 0605  NA 137 140 140  K 3.5 4.2 4.0  CL 104 106 108  CO2 26 27 24   GLUCOSE 138* 94 119*  BUN  15 12 13   CREATININE 1.31* 1.35* 1.44*  CALCIUM 9.0 9.2 8.7*  GFRNONAA >60 >60 60*  ANIONGAP 7 7 8      Hematology Recent Labs  Lab 07/21/20 2213 07/22/20 0237 07/23/20 0605  WBC 9.0 8.3 6.8  RBC 4.96 4.83 4.83  HGB 16.2 15.6 15.6  HCT 46.7 44.9 45.9  MCV 94.2 93.0 95.0  MCH 32.7 32.3 32.3  MCHC 34.7 34.7 34.0  RDW 14.5 14.4 14.7  PLT 207 193 181    BNPNo results for input(s): BNP, PROBNP in the last 168 hours.   DDimer No results for input(s): DDIMER in the last 168 hours.   Radiology    DG Chest 2 View  Result Date: 07/21/2020 CLINICAL DATA:  Chest pain EXAM: CHEST - 2 VIEW COMPARISON:  12/28/2013 FINDINGS: The heart size and mediastinal contours are within normal limits. Both lungs are clear. The visualized skeletal structures are unremarkable. IMPRESSION: No active cardiopulmonary disease. Electronically Signed   By: Donavan Foil M.D.   On: 07/21/2020 15:48   CARDIAC CATHETERIZATION  Result Date: 07/22/2020 Images from the original result were not included.  Prox LAD to Mid LAD lesion is 90% stenosed.  Prox Cx to Mid  Cx lesion is 90% stenosed.  Mid RCA lesion is 50% stenosed.  Roberto Morrison is a 50 y.o. male  [id number] LOCATION:  FACILITY: [facility] PHYSICIAN: Quay Burow, M.D. 11-Aug-1970 DATE OF PROCEDURE:  07/22/2020 DATE OF DISCHARGE: CARDIAC CATHETERIZATION History obtained from chart review.50 y.o. male with no PMH who presented with chest pain after being at the gym and noted to have NSTEMI.   Roberto Morrison was admitted with a non-STEMI.  He has three-vessel disease.  He has a long segmental mid LAD lesion and a moderately long mid AV groove circumflex lesion lesion with what appears to be a moderate though not significant distal RCA lesion.  His filling pressures were normal.  His EF looked preserved.  I believe he would be best served with coronary artery bypass grafting with bilateral IMA's.  Surgical consult has been called.  The sheath was removed and a TR band was  placed on the right wrist to achieve patent hemostasis.  The patient left the lab in stable condition.  Plan will be to restart IV heparin 4 hours after sheath removal. Quay Burow. MD, [facility] 07/22/2020 4:11 PM   ECHOCARDIOGRAM COMPLETE  Result Date: 07/22/2020    ECHOCARDIOGRAM REPORT   Patient Name:   Roberto Morrison Date of Exam: 07/22/2020 Medical Rec #:  [id number]     Height:       72.0 in Accession #:    [id number]    Weight:       285.4 lb Date of Birth:  08-26-1970      BSA:          2.478 m Patient Age:    50 years      BP:           137/74 mmHg Patient Gender: M             HR:           80 bpm. Exam Location:  Inpatient Procedure: 2D Echo, Cardiac Doppler and Color Doppler Indications:    Chest pain  History:        Patient has no prior history of Echocardiogram examinations.                 Acute MI; Signs/Symptoms:Chest Pain.  Sonographer:    [place] Referring Phys: [id number] [place]  1. Left ventricular ejection fraction, by estimation, is 30 to 35%. The left ventricle has moderately decreased function. The left ventricle demonstrates regional wall motion abnormalities (see scoring diagram/findings for description). Left ventricular  diastolic parameters are consistent with Grade I diastolic dysfunction (impaired relaxation). There is severe hypokinesis of the left ventricular, mid-apical anteroseptal wall.  2. Right ventricular systolic function is normal. The right ventricular size is normal.  3. The mitral valve is normal in structure. Trivial mitral valve regurgitation. No evidence of mitral stenosis.  4. The aortic valve is normal in structure. Aortic valve regurgitation is not visualized. No aortic stenosis is present. FINDINGS  Left Ventricle: Left ventricular ejection fraction, by estimation, is 30 to 35%. The left ventricle has moderately decreased function. The left ventricle demonstrates regional wall motion abnormalities. Severe hypokinesis of the left  ventricular, mid-apical anteroseptal wall. The left ventricular internal cavity size was normal in size. There is no left ventricular hypertrophy. Left ventricular diastolic parameters are consistent with Grade I diastolic dysfunction (impaired relaxation). Right Ventricle: The right ventricular size is normal. Right vetricular wall thickness was not well visualized. Right ventricular systolic function is  normal. Left Atrium: Left atrial size was normal in size. Right Atrium: Right atrial size was normal in size. Pericardium: There is no evidence of pericardial effusion. Mitral Valve: The mitral valve is normal in structure. Trivial mitral valve regurgitation. No evidence of mitral valve stenosis. Tricuspid Valve: The tricuspid valve is normal in structure. Tricuspid valve regurgitation is not demonstrated. Aortic Valve: The aortic valve is normal in structure. Aortic valve regurgitation is not visualized. No aortic stenosis is present. Aortic valve mean gradient measures 3.0 mmHg. Aortic valve peak gradient measures 6.0 mmHg. Aortic valve area, by VTI measures 3.03 cm. Pulmonic Valve: The pulmonic valve was not well visualized. Pulmonic valve regurgitation is not visualized. Aorta: The aortic root and ascending aorta are structurally normal, with no evidence of dilitation. IAS/Shunts: The atrial septum is grossly normal.  LEFT VENTRICLE PLAX 2D LVIDd:         6.40 cm      Diastology LVIDs:         4.40 cm      LV e' medial:    4.87 cm/s LV PW:         1.00 cm      LV E/e' medial:  11.3 LV IVS:        1.10 cm      LV e' lateral:   6.68 cm/s LVOT diam:     2.50 cm      LV E/e' lateral: 8.2 LV SV:         66 LV SV Index:   27 LVOT Area:     4.91 cm  LV Volumes (MOD) LV vol d, MOD A2C: 123.0 ml LV vol d, MOD A4C: 151.0 ml LV vol s, MOD A2C: 49.8 ml LV vol s, MOD A4C: 80.9 ml LV SV MOD A2C:     73.2 ml LV SV MOD A4C:     151.0 ml LV SV MOD BP:      78.8 ml RIGHT VENTRICLE RV Basal diam:  4.10 cm RV Mid diam:    2.10  cm TAPSE (M-mode): 2.1 cm LEFT ATRIUM             Index       RIGHT ATRIUM           Index LA diam:        3.90 cm 1.57 cm/m  RA Area:     12.20 cm LA Vol (A2C):   81.8 ml 33.01 ml/m RA Volume:   24.20 ml  9.77 ml/m LA Vol (A4C):   55.6 ml 22.44 ml/m LA Biplane Vol: 70.8 ml 28.57 ml/m  AORTIC VALVE                   PULMONIC VALVE AV Area (Vmax):    3.16 cm    PV Vmax:       0.95 m/s AV Area (Vmean):   3.07 cm    PV Vmean:      65.000 cm/s AV Area (VTI):     3.03 cm    PV VTI:        0.176 m AV Vmax:           122.00 cm/s PV Peak grad:  3.6 mmHg AV Vmean:          81.400 cm/s PV Mean grad:  2.0 mmHg AV VTI:            0.217 m AV Peak Grad:      6.0 mmHg AV Mean Grad:  3.0 mmHg LVOT Vmax:         78.60 cm/s LVOT Vmean:        50.900 cm/s LVOT VTI:          0.134 m LVOT/AV VTI ratio: 0.62  AORTA Ao Root diam: 3.80 cm Ao Asc diam:  3.50 cm MITRAL VALVE MV Area (PHT): 3.77 cm    SHUNTS MV Decel Time: 201 msec    Systemic VTI:  0.13 m MV E velocity: 54.80 cm/s  Systemic Diam: 2.50 cm MV A velocity: 66.00 cm/s MV E/A ratio:  0.83 Mertie Moores MD Electronically signed by Mertie Moores MD Signature Date/Time: 07/22/2020/4:23:21 PM    Final     Cardiac Studies   Patient Profile     50 y.o. male with no PMH who presented with chest pain after being at the gym and noted to have NSTEMI.   Assessment & Plan    NSTEMI - trop up to 1796 - echo LVEF 30-35%, grade I dd,  - cath: prox to mid LAD 90%, prox to mid LCX 90%, mid RCA 50% - CT surgery has been consulted to consider CABG  - medical therapy with hep gtt, ASA 81, lopressor 12.5mg  bid. No ACE/ARB due to renal dysfunction. Would change lopressor to coreg 6.25mg  bid.    2. Acute systolic HF - new diagnosis of acute systolic HF this admission - echo LVEF 30-35%, grade I dd,  - appears euvolemic, LVEDP 12 by cath.   - change lopressor to coreg 6.25mg  bid. No ACE/ARB/ARNI at this time due to renal dysfunction. Consider post CABG pending renal  function and hemodynamics. Start bidil 1 tablet bid for now.    3. HTN - stop norvasc, change lopressor to coreg and add bidil     For questions or updates, please contact [place] Please consult [email or web address] for contact info under        Signed, Carlyle Dolly, MD  07/23/2020, 8:27 AM

## 2020-07-24 ENCOUNTER — Inpatient Hospital Stay (HOSPITAL_COMMUNITY): Payer: 59

## 2020-07-24 DIAGNOSIS — Z01810 Encounter for preprocedural cardiovascular examination: Secondary | ICD-10-CM

## 2020-07-24 DIAGNOSIS — I214 Non-ST elevation (NSTEMI) myocardial infarction: Secondary | ICD-10-CM | POA: Diagnosis not present

## 2020-07-24 LAB — CBC
HCT: 47.3 % (ref 39.0–52.0)
Hemoglobin: 16.1 g/dL (ref 13.0–17.0)
MCH: 32.4 pg (ref 26.0–34.0)
MCHC: 34 g/dL (ref 30.0–36.0)
MCV: 95.2 fL (ref 80.0–100.0)
Platelets: 201 10*3/uL (ref 150–400)
RBC: 4.97 MIL/uL (ref 4.22–5.81)
RDW: 14.6 % (ref 11.5–15.5)
WBC: 8 10*3/uL (ref 4.0–10.5)
nRBC: 0 % (ref 0.0–0.2)

## 2020-07-24 LAB — HEPARIN LEVEL (UNFRACTIONATED)
Heparin Unfractionated: 0.2 IU/mL — ABNORMAL LOW (ref 0.30–0.70)
Heparin Unfractionated: 0.41 IU/mL (ref 0.30–0.70)
Heparin Unfractionated: 0.38 IU/mL (ref 0.30–0.70)

## 2020-07-24 MED ORDER — ISOSORB DINITRATE-HYDRALAZINE 20-37.5 MG PO TABS
1.0000 | ORAL_TABLET | Freq: Three times a day (TID) | ORAL | Status: DC
  Administered 2020-07-24 – 2020-07-25 (×6): 1 via ORAL
  Filled 2020-07-24 (×6): qty 1

## 2020-07-24 MED ORDER — CHLORHEXIDINE GLUCONATE CLOTH 2 % EX PADS
6.0000 | MEDICATED_PAD | Freq: Every day | CUTANEOUS | Status: DC
  Administered 2020-07-24 – 2020-07-25 (×2): 6 via TOPICAL

## 2020-07-24 MED ORDER — MUPIROCIN 2 % EX OINT
1.0000 "application " | TOPICAL_OINTMENT | Freq: Two times a day (BID) | CUTANEOUS | Status: DC
  Administered 2020-07-24 – 2020-07-25 (×4): 1 via NASAL
  Filled 2020-07-24: qty 22

## 2020-07-24 NOTE — Progress Notes (Signed)
[place] for heparin Indication: chest pain/ACS  Allergies  Allergen Reactions   Prednisone Other (See Comments)    "Hiccups"     Patient Measurements: Height: 6' (182.9 cm) Weight: 129.5 kg (285 lb 6.4 oz) IBW/kg (Calculated) : 77.6 kg Heparin Dosing Weight: 107 kg  Vital Signs: Temp: 98.4 F (36.9 C) (06/19 0439) Temp Source: Oral (06/19 0439) BP: 123/87 (06/19 0845) Pulse Rate: 84 (06/19 0845)  Labs: Recent Labs    07/21/20 1739 07/21/20 1951 07/21/20 2213 07/21/20 2213 07/22/20 0028 07/22/20 0237 07/22/20 0920 07/23/20 1610 07/23/20 1623 07/23/20 2327 07/24/20 0817 07/24/20 1501  HGB  --   --  16.2  --   --  15.6  --  15.6  --   --  16.1  --   HCT  --   --  46.7  --   --  44.9  --  45.9  --   --  47.3  --   PLT  --   --  207  --   --  193  --  181  --   --  201  --   APTT 26  --   --   --   --   --   --   --   --   --   --   --   LABPROT 14.0  --   --   --   --   --   --   --   --   --   --   --   INR 1.1  --   --   --   --   --   --   --   --   --   --   --   HEPARINUNFRC  --   --   --    < > <0.10*  --  0.16* <0.10*   < > 0.20* 0.41 0.38  CREATININE  --   --  1.31*  --   --   --  1.35* 1.44*  --   --   --   --   TROPONINIHS 270* 679* 1,161*  --  1,796*  --   --   --   --   --   --   --    < > = values in this interval not displayed.     Estimated Creatinine Clearance: 86.4 mL/min (A) (by C-G formula based on SCr of 1.44 mg/dL (H)).   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 The patient is now planned for CABG on 6/21. PTA the patient is not on anticoagulation. Pharmacy is consulted to dose heparin.  The heparin level is therapeutic at 0.38 while running at 2200 units/hr. Per the RN, there have been no issues with the infusion and the patient is without signs or symptoms of bleeding. CBC is WNL  and stable.   Goal of Therapy:  Heparin level 0.3-0.7 units/ml Monitor platelets by anticoagulation protocol: Yes   Plan:  -Continue heparin IV at 2200 units/hr -Obtain a daily heparin level and CBC -Monitor for signs and symptoms of bleeding  Erin Hearing PharmD., BCPS Clinical Pharmacist 07/24/2020 3:34 PM   Please check [email or web address] for unit-specific pharmacy phone numbers.

## 2020-07-24 NOTE — Progress Notes (Signed)
[place] for heparin Indication: chest pain/ACS  Allergies  Allergen Reactions   Prednisone Other (See Comments)    "Hiccups"     Patient Measurements: Height: 6' (182.9 cm) Weight: 129.5 kg (285 lb 6.4 oz) IBW/kg (Calculated) : 77.6 kg Heparin Dosing Weight: 107 kg  Vital Signs: Temp: 98.4 F (36.9 C) (06/19 0439) Temp Source: Oral (06/19 0439) BP: 131/101 (06/19 0439)  Labs: Recent Labs    07/21/20 1739 07/21/20 1951 07/21/20 2213 07/21/20 2213 07/22/20 0028 07/22/20 0237 07/22/20 0920 07/23/20 0605 07/23/20 1623 07/23/20 2327  HGB  --   --  16.2  --   --  15.6  --  15.6  --   --   HCT  --   --  46.7  --   --  44.9  --  45.9  --   --   PLT  --   --  207  --   --  193  --  181  --   --   APTT 26  --   --   --   --   --   --   --   --   --   LABPROT 14.0  --   --   --   --   --   --   --   --   --   INR 1.1  --   --   --   --   --   --   --   --   --   HEPARINUNFRC  --   --   --    < > <0.10*  --  0.16* <0.10* 0.18* 0.20*  CREATININE  --   --  1.31*  --   --   --  1.35* 1.44*  --   --   TROPONINIHS 270* 679* 1,161*  --  1,796*  --   --   --   --   --    < > = values in this interval not displayed.     Estimated Creatinine Clearance: 86.4 mL/min (A) (by C-G formula based on SCr of 1.44 mg/dL (H)).   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 The patient is now planned for CABG on 6/21. PTA the patient is not on anticoagulation. Pharmacy is consulted to dose heparin.  The heparin level is therapeutic at 0.41 while running at 2200 units/hr. Per the RN, there have been no issues with the infusion and the patient is without signs or symptoms of bleeding. CBC is WNL and stable.   Goal of Therapy:  Heparin level 0.3-0.7 units/ml Monitor platelets by anticoagulation protocol: Yes   Plan:  -Continue heparin  IV at 2200 units/hr -Obtain a 6-hr confirmatory heparin level -Obtain a daily heparin level and CBC -Monitor for signs and symptoms of bleeding   Shauna Hugh, PharmD, [place]  PGY-1 Pharmacy Resident 07/24/2020 8:47 AM  Please check [email or web address] for unit-specific pharmacy phone numbers.

## 2020-07-24 NOTE — Progress Notes (Signed)
ANTICOAGULATION CONSULT NOTE  Pharmacy Consult for heparin Indication: chest pain/ACS  Allergies  Allergen Reactions   Prednisone Other (See Comments)    "Hiccups"     Patient Measurements: Height: 6' (182.9 cm) Weight: 129.5 kg (285 lb 6.4 oz) IBW/kg (Calculated) : 77.6 Heparin Dosing Weight: 107 kg  Vital Signs: Temp: 97.6 F (36.4 C) (06/18 2024) Temp Source: Oral (06/18 2024) BP: 144/116 (06/18 2024) Pulse Rate: 68 (06/18 2024)  Labs: Recent Labs    07/21/20 1739 07/21/20 1951 07/21/20 2213 07/21/20 2213 07/22/20 0028 07/22/20 0237 07/22/20 0920 07/23/20 0605 07/23/20 1623 07/23/20 2327  HGB  --   --  16.2  --   --  15.6  --  15.6  --   --   HCT  --   --  46.7  --   --  44.9  --  45.9  --   --   PLT  --   --  207  --   --  193  --  181  --   --   APTT 26  --   --   --   --   --   --   --   --   --   LABPROT 14.0  --   --   --   --   --   --   --   --   --   INR 1.1  --   --   --   --   --   --   --   --   --   HEPARINUNFRC  --   --   --    < > <0.10*  --  0.16* <0.10* 0.18* 0.20*  CREATININE  --   --  1.31*  --   --   --  1.35* 1.44*  --   --   TROPONINIHS 270* 679* 1,161*  --  1,796*  --   --   --   --   --    < > = values in this interval not displayed.     Estimated Creatinine Clearance: 86.4 mL/min (A) (by C-G formula based on SCr of 1.44 mg/dL (H)).   Assessment: 50 y.o. male with CAD s/p cath, awaiting possible CABG, for heparin  Heparin level remains subtherapeutic s/p rate increase to 1800 units/hr, plans for CABG Tuesday  6/19 AM update:  Heparin level low but trending up CABG Tues  Goal of Therapy:  Heparin level 0.3-0.7 units/ml Monitor platelets by anticoagulation protocol: Yes   Plan:  Inc heparin to 2200 units/hr 0830 heparin level  Narda Bonds, PharmD, [place] Pharmacist Phone: [phone]

## 2020-07-24 NOTE — Progress Notes (Signed)
Pre-CABG exam has been completed.   Results can be found under chart review under CV PROC. 07/24/2020 3:13 PM Darly Fails RVT, RDMS

## 2020-07-24 NOTE — Progress Notes (Signed)
Progress Note  Patient Name: Roberto Morrison Date of Encounter: 07/24/2020  [facility] Cardiologist: Quay Burow, MD   Subjective   No complaints  Inpatient Medications    Scheduled Meds:  atorvastatin  80 mg Oral Daily   carvedilol  6.25 mg Oral BID WC   Chlorhexidine Gluconate Cloth  6 each Topical Q0600   isosorbide-hydrALAZINE  1 tablet Oral BID   mupirocin ointment  1 application Nasal BID   sodium chloride flush  3 mL Intravenous Q12H   sodium chloride flush  3 mL Intravenous Q12H   Continuous Infusions:  sodium chloride     heparin 2,200 Units/hr (07/24/20 0706)   PRN Meds: sodium chloride, acetaminophen, morphine injection, nitroGLYCERIN, ondansetron (ZOFRAN) IV, sodium chloride flush   Vital Signs    Vitals:   07/23/20 1100 07/23/20 1743 07/23/20 2024 07/24/20 0439  BP: 128/63  (!) 144/116 (!) 131/101  Pulse: 84 88 68   Resp: 16  17 19   Temp: 97.8 F (36.6 C)  97.6 F (36.4 C) 98.4 F (36.9 C)  TempSrc: Oral  Oral Oral  SpO2:   97%   Weight:      Height:        Intake/Output Summary (Last 24 hours) at 07/24/2020 0752 Last data filed at 07/24/2020 0424 Gross per 24 hour  Intake 450 ml  Output --  Net 450 ml   Last 3 Weights 07/21/2020 07/21/2020 12/28/2013  Weight (lbs) 285 lb 6.4 oz 288 lb 275 lb  Weight (kg) 129.457 kg 130.636 kg 124.739 kg      Telemetry    NSR- Personally Reviewed  ECG    N/a - Personally Reviewed  Physical Exam   GEN: No acute distress.   Neck: No JVD Cardiac: RRR, no murmurs, rubs, or gallops.  Respiratory: Clear to auscultation bilaterally. GI: Soft, nontender, non-distended  MS: No edema; No deformity. Neuro:  Nonfocal  Psych: Normal affect   Labs    High Sensitivity Troponin:   Recent Labs  Lab 07/21/20 1555 07/21/20 1739 07/21/20 1951 07/21/20 2213 07/22/20 0028  TROPONINIHS 150* 270* 679* 1,161* 1,796*      Chemistry Recent Labs  Lab 07/21/20 2213 07/22/20 0920 07/23/20 0605  NA 137  140 140  K 3.5 4.2 4.0  CL 104 106 108  CO2 26 27 24   GLUCOSE 138* 94 119*  BUN 15 12 13   CREATININE 1.31* 1.35* 1.44*  CALCIUM 9.0 9.2 8.7*  GFRNONAA >60 >60 60*  ANIONGAP 7 7 8      Hematology Recent Labs  Lab 07/21/20 2213 07/22/20 0237 07/23/20 0605  WBC 9.0 8.3 6.8  RBC 4.96 4.83 4.83  HGB 16.2 15.6 15.6  HCT 46.7 44.9 45.9  MCV 94.2 93.0 95.0  MCH 32.7 32.3 32.3  MCHC 34.7 34.7 34.0  RDW 14.5 14.4 14.7  PLT 207 193 181    BNPNo results for input(s): BNP, PROBNP in the last 168 hours.   DDimer No results for input(s): DDIMER in the last 168 hours.   Radiology    CARDIAC CATHETERIZATION  Result Date: 07/22/2020 Images from the original result were not included.  Prox LAD to Mid LAD lesion is 90% stenosed.  Prox Cx to Mid Cx lesion is 90% stenosed.  Mid RCA lesion is 50% stenosed.  JORDI LACKO is a 50 y.o. male  [id number] LOCATION:  FACILITY: [facility] PHYSICIAN: Quay Burow, M.D. [birth year] DATE OF PROCEDURE:  07/22/2020 DATE OF DISCHARGE: CARDIAC CATHETERIZATION History obtained from chart review.Regan  y.o. male with no PMH who presented with chest pain after being at the gym and noted to have NSTEMI.   Mr. Outten was admitted with a non-STEMI.  He has three-vessel disease.  He has a long segmental mid LAD lesion and a moderately long mid AV groove circumflex lesion lesion with what appears to be a moderate though not significant distal RCA lesion.  His filling pressures were normal.  His EF looked preserved.  I believe he would be best served with coronary artery bypass grafting with bilateral IMA's.  Surgical consult has been called.  The sheath was removed and a TR band was placed on the right wrist to achieve patent hemostasis.  The patient left the lab in stable condition.  Plan will be to restart IV heparin 4 hours after sheath removal. Quay Burow. MD, [facility] 07/22/2020 4:11 PM   ECHOCARDIOGRAM COMPLETE  Result Date: 07/22/2020    ECHOCARDIOGRAM REPORT   Patient  Name:   Sara PALMER FAHRNER Date of Exam: 07/22/2020 Medical Rec #:  [id number]     Height:       72.0 in Accession #:    [id number]    Weight:       285.4 lb Date of Birth:  09-25-1970      BSA:          2.478 m Patient Age:    53 years      BP:           137/74 mmHg Patient Gender: M             HR:           80 bpm. Exam Location:  Inpatient Procedure: 2D Echo, Cardiac Doppler and Color Doppler Indications:    Chest pain  History:        Patient has no prior history of Echocardiogram examinations.                 Acute MI; Signs/Symptoms:Chest Pain.  Sonographer:    [place] Referring Phys: [id number] [place]  1. Left ventricular ejection fraction, by estimation, is 30 to 35%. The left ventricle has moderately decreased function. The left ventricle demonstrates regional wall motion abnormalities (see scoring diagram/findings for description). Left ventricular  diastolic parameters are consistent with Grade I diastolic dysfunction (impaired relaxation). There is severe hypokinesis of the left ventricular, mid-apical anteroseptal wall.  2. Right ventricular systolic function is normal. The right ventricular size is normal.  3. The mitral valve is normal in structure. Trivial mitral valve regurgitation. No evidence of mitral stenosis.  4. The aortic valve is normal in structure. Aortic valve regurgitation is not visualized. No aortic stenosis is present. FINDINGS  Left Ventricle: Left ventricular ejection fraction, by estimation, is 30 to 35%. The left ventricle has moderately decreased function. The left ventricle demonstrates regional wall motion abnormalities. Severe hypokinesis of the left ventricular, mid-apical anteroseptal wall. The left ventricular internal cavity size was normal in size. There is no left ventricular hypertrophy. Left ventricular diastolic parameters are consistent with Grade I diastolic dysfunction (impaired relaxation). Right Ventricle: The right ventricular size is  normal. Right vetricular wall thickness was not well visualized. Right ventricular systolic function is normal. Left Atrium: Left atrial size was normal in size. Right Atrium: Right atrial size was normal in size. Pericardium: There is no evidence of pericardial effusion. Mitral Valve: The mitral valve is normal in structure. Trivial mitral valve regurgitation. No evidence of mitral valve stenosis. Tricuspid  Valve: The tricuspid valve is normal in structure. Tricuspid valve regurgitation is not demonstrated. Aortic Valve: The aortic valve is normal in structure. Aortic valve regurgitation is not visualized. No aortic stenosis is present. Aortic valve mean gradient measures 3.0 mmHg. Aortic valve peak gradient measures 6.0 mmHg. Aortic valve area, by VTI measures 3.03 cm. Pulmonic Valve: The pulmonic valve was not well visualized. Pulmonic valve regurgitation is not visualized. Aorta: The aortic root and ascending aorta are structurally normal, with no evidence of dilitation. IAS/Shunts: The atrial septum is grossly normal.  LEFT VENTRICLE PLAX 2D LVIDd:         6.40 cm      Diastology LVIDs:         4.40 cm      LV e' medial:    4.87 cm/s LV PW:         1.00 cm      LV E/e' medial:  11.3 LV IVS:        1.10 cm      LV e' lateral:   6.68 cm/s LVOT diam:     2.50 cm      LV E/e' lateral: 8.2 LV SV:         66 LV SV Index:   27 LVOT Area:     4.91 cm  LV Volumes (MOD) LV vol d, MOD A2C: 123.0 ml LV vol d, MOD A4C: 151.0 ml LV vol s, MOD A2C: 49.8 ml LV vol s, MOD A4C: 80.9 ml LV SV MOD A2C:     73.2 ml LV SV MOD A4C:     151.0 ml LV SV MOD BP:      78.8 ml RIGHT VENTRICLE RV Basal diam:  4.10 cm RV Mid diam:    2.10 cm TAPSE (M-mode): 2.1 cm LEFT ATRIUM             Index       RIGHT ATRIUM           Index LA diam:        3.90 cm 1.57 cm/m  RA Area:     12.20 cm LA Vol (A2C):   81.8 ml 33.01 ml/m RA Volume:   24.20 ml  9.77 ml/m LA Vol (A4C):   55.6 ml 22.44 ml/m LA Biplane Vol: 70.8 ml 28.57 ml/m  AORTIC VALVE                    PULMONIC VALVE AV Area (Vmax):    3.16 cm    PV Vmax:       0.95 m/s AV Area (Vmean):   3.07 cm    PV Vmean:      65.000 cm/s AV Area (VTI):     3.03 cm    PV VTI:        0.176 m AV Vmax:           122.00 cm/s PV Peak grad:  3.6 mmHg AV Vmean:          81.400 cm/s PV Mean grad:  2.0 mmHg AV VTI:            0.217 m AV Peak Grad:      6.0 mmHg AV Mean Grad:      3.0 mmHg LVOT Vmax:         78.60 cm/s LVOT Vmean:        50.900 cm/s LVOT VTI:          0.134 m LVOT/AV VTI ratio: 0.62  AORTA Ao Root diam: 3.80 cm Ao Asc diam:  3.50 cm MITRAL VALVE MV Area (PHT): 3.77 cm    SHUNTS MV Decel Time: 201 msec    Systemic VTI:  0.13 m MV E velocity: 54.80 cm/s  Systemic Diam: 2.50 cm MV A velocity: 66.00 cm/s MV E/A ratio:  0.83 Mertie Moores MD Electronically signed by Mertie Moores MD Signature Date/Time: 07/22/2020/4:23:21 PM    Final     Cardiac Studies    Patient Profile     50 y.o. male with no PMH who presented with chest pain after being at the gym and noted to have NSTEMI.  Assessment & Plan    NSTEMI - trop up to 1796 - echo LVEF 30-35%, grade I dd, - cath: prox to mid LAD 90%, prox to mid LCX 90%, mid RCA 50% - CT surgery has been consulted to consider CABG, plan for surgery Tuesday   - medical therapy with hep gtt, ASA 81, coreg 6.25mg  bidbid, atorva 80. No ACE/ARB due to some renal dysfunction, consider post CABG     2. Acute systolic HF - new diagnosis of acute systolic HF this admission - echo LVEF 30-35%, grade I dd,  - appears euvolemic, LVEDP 12 by cath.    - on coreg 6.25mg  bid, bidil bid. Mild renal dysfunction avoiding ACE/ARB/ARNI prior to CABG, would consider post CABG.    3. HTN - incse bidil to tid dosing.     For questions or updates, please contact [place] Please consult [email or web address] for contact info under        Signed, Carlyle Dolly, MD  07/24/2020, 7:52 AM

## 2020-07-25 ENCOUNTER — Encounter (HOSPITAL_COMMUNITY): Payer: Self-pay | Admitting: Cardiovascular Disease

## 2020-07-25 DIAGNOSIS — I214 Non-ST elevation (NSTEMI) myocardial infarction: Secondary | ICD-10-CM | POA: Diagnosis not present

## 2020-07-25 DIAGNOSIS — I25110 Atherosclerotic heart disease of native coronary artery with unstable angina pectoris: Secondary | ICD-10-CM

## 2020-07-25 DIAGNOSIS — N289 Disorder of kidney and ureter, unspecified: Secondary | ICD-10-CM

## 2020-07-25 DIAGNOSIS — I255 Ischemic cardiomyopathy: Secondary | ICD-10-CM | POA: Diagnosis not present

## 2020-07-25 LAB — LIPID PANEL
Cholesterol: 171 mg/dL (ref 0–200)
HDL: 32 mg/dL — ABNORMAL LOW (ref >40)
LDL Cholesterol: 125 mg/dL — ABNORMAL HIGH (ref 0–99)
Total CHOL/HDL Ratio: 5.3 RATIO
Triglycerides: 71 mg/dL (ref <150)
VLDL: 14 mg/dL (ref 0–40)

## 2020-07-25 LAB — BASIC METABOLIC PANEL
Anion gap: 8 (ref 5–15)
BUN: 15 mg/dL (ref 6–20)
CO2: 25 mmol/L (ref 22–32)
Calcium: 9.2 mg/dL (ref 8.9–10.3)
Chloride: 106 mmol/L (ref 98–111)
Creatinine, Ser: 1.3 mg/dL — ABNORMAL HIGH (ref 0.61–1.24)
GFR, Estimated: >60 mL/min (ref >60)
Glucose, Bld: 77 mg/dL (ref 70–99)
Potassium: 4.1 mmol/L (ref 3.5–5.1)
Sodium: 139 mmol/L (ref 135–145)

## 2020-07-25 LAB — CBC
HCT: 45.5 % (ref 39.0–52.0)
Hemoglobin: 15.3 g/dL (ref 13.0–17.0)
MCH: 32.1 pg (ref 26.0–34.0)
MCHC: 33.6 g/dL (ref 30.0–36.0)
MCV: 95.6 fL (ref 80.0–100.0)
Platelets: 193 10*3/uL (ref 150–400)
RBC: 4.76 MIL/uL (ref 4.22–5.81)
RDW: 14.6 % (ref 11.5–15.5)
WBC: 6.6 10*3/uL (ref 4.0–10.5)
nRBC: 0 % (ref 0.0–0.2)

## 2020-07-25 LAB — PROTIME-INR
INR: 1.1 (ref 0.8–1.2)
Prothrombin Time: 13.8 seconds (ref 11.4–15.2)

## 2020-07-25 LAB — BLOOD GAS, ARTERIAL
Acid-base deficit: 1.2 mmol/L (ref 0.0–2.0)
Bicarbonate: 22.6 mmol/L (ref 20.0–28.0)
Drawn by: 313941
FIO2: 21
O2 Saturation: 97.5 %
Patient temperature: 36.5
pCO2 arterial: 34.9 mmHg (ref 32.0–48.0)
pH, Arterial: 7.425 (ref 7.350–7.450)
pO2, Arterial: 97.8 mmHg (ref 83.0–108.0)

## 2020-07-25 LAB — URINALYSIS, ROUTINE W REFLEX MICROSCOPIC
Bacteria, UA: NONE SEEN
Bilirubin Urine: NEGATIVE
Glucose, UA: NEGATIVE mg/dL
Hgb urine dipstick: NEGATIVE
Ketones, ur: NEGATIVE mg/dL
Leukocytes,Ua: NEGATIVE
Nitrite: NEGATIVE
Protein, ur: 30 mg/dL — AB
Specific Gravity, Urine: 1.028 (ref 1.005–1.030)
pH: 6 (ref 5.0–8.0)

## 2020-07-25 LAB — TYPE AND SCREEN
ABO/RH(D): O POS
Antibody Screen: NEGATIVE

## 2020-07-25 LAB — HEPARIN LEVEL (UNFRACTIONATED): Heparin Unfractionated: 0.31 IU/mL (ref 0.30–0.70)

## 2020-07-25 LAB — ABO/RH: ABO/RH(D): O POS

## 2020-07-25 LAB — APTT: aPTT: 70 seconds — ABNORMAL HIGH (ref 24–36)

## 2020-07-25 MED ORDER — PLASMA-LYTE A IV SOLN
INTRAVENOUS | Status: DC
  Filled 2020-07-25: qty 5

## 2020-07-25 MED ORDER — ASPIRIN EC 81 MG PO TBEC
81.0000 mg | DELAYED_RELEASE_TABLET | Freq: Every day | ORAL | Status: DC
  Administered 2020-07-25: 81 mg via ORAL
  Filled 2020-07-25: qty 1

## 2020-07-25 MED ORDER — POTASSIUM CHLORIDE 2 MEQ/ML IV SOLN
80.0000 meq | INTRAVENOUS | Status: DC
  Filled 2020-07-25: qty 40

## 2020-07-25 MED ORDER — CHLORHEXIDINE GLUCONATE CLOTH 2 % EX PADS
6.0000 | MEDICATED_PAD | Freq: Once | CUTANEOUS | Status: AC
  Administered 2020-07-26: 6 via TOPICAL

## 2020-07-25 MED ORDER — TRANEXAMIC ACID 1000 MG/10ML IV SOLN
1.5000 mg/kg/h | INTRAVENOUS | Status: AC
  Administered 2020-07-26: 1.5 mg/kg/h via INTRAVENOUS
  Filled 2020-07-25: qty 25

## 2020-07-25 MED ORDER — EPINEPHRINE HCL 5 MG/250ML IV SOLN IN NS
0.0000 ug/min | INTRAVENOUS | Status: DC
Start: 2020-07-26 — End: 2020-07-26
  Filled 2020-07-25: qty 250

## 2020-07-25 MED ORDER — DEXMEDETOMIDINE HCL IN NACL 400 MCG/100ML IV SOLN
0.1000 ug/kg/h | INTRAVENOUS | Status: AC
  Administered 2020-07-26: .3 ug/kg/h via INTRAVENOUS
  Filled 2020-07-25: qty 100

## 2020-07-25 MED ORDER — VANCOMYCIN HCL 1500 MG/300ML IV SOLN
1500.0000 mg | INTRAVENOUS | Status: AC
  Administered 2020-07-26: 1500 mg via INTRAVENOUS
  Filled 2020-07-25 (×2): qty 300

## 2020-07-25 MED ORDER — NOREPINEPHRINE 4 MG/250ML-% IV SOLN
0.0000 ug/min | INTRAVENOUS | Status: DC
  Filled 2020-07-25: qty 250

## 2020-07-25 MED ORDER — MILRINONE LACTATE IN DEXTROSE 20-5 MG/100ML-% IV SOLN
0.3000 ug/kg/min | INTRAVENOUS | Status: AC
  Administered 2020-07-26: .3 ug/kg/min via INTRAVENOUS
  Filled 2020-07-25: qty 100

## 2020-07-25 MED ORDER — MAGNESIUM SULFATE 50 % IJ SOLN
40.0000 meq | INTRAMUSCULAR | Status: DC
  Filled 2020-07-25: qty 9.85

## 2020-07-25 MED ORDER — METOPROLOL TARTRATE 12.5 MG HALF TABLET
12.5000 mg | ORAL_TABLET | Freq: Once | ORAL | Status: AC
  Administered 2020-07-26: 12.5 mg via ORAL
  Filled 2020-07-25: qty 1

## 2020-07-25 MED ORDER — TEMAZEPAM 15 MG PO CAPS
15.0000 mg | ORAL_CAPSULE | Freq: Once | ORAL | Status: AC | PRN
  Administered 2020-07-25: 15 mg via ORAL
  Filled 2020-07-25: qty 1

## 2020-07-25 MED ORDER — SODIUM CHLORIDE 0.9 % IV SOLN
INTRAVENOUS | Status: DC
  Filled 2020-07-25: qty 30

## 2020-07-25 MED ORDER — PHENYLEPHRINE HCL-NACL 20-0.9 MG/250ML-% IV SOLN
30.0000 ug/min | INTRAVENOUS | Status: AC
  Administered 2020-07-26: 25 ug/min via INTRAVENOUS
  Filled 2020-07-25: qty 250

## 2020-07-25 MED ORDER — CEFAZOLIN IN SODIUM CHLORIDE 3-0.9 GM/100ML-% IV SOLN
3.0000 g | INTRAVENOUS | Status: AC
  Administered 2020-07-26: 3 g via INTRAVENOUS
  Filled 2020-07-25: qty 100

## 2020-07-25 MED ORDER — TRANEXAMIC ACID (OHS) PUMP PRIME SOLUTION
2.0000 mg/kg | INTRAVENOUS | Status: DC
  Filled 2020-07-25: qty 2.55

## 2020-07-25 MED ORDER — CHLORHEXIDINE GLUCONATE CLOTH 2 % EX PADS
6.0000 | MEDICATED_PAD | Freq: Once | CUTANEOUS | Status: AC
  Administered 2020-07-25: 6 via TOPICAL

## 2020-07-25 MED ORDER — BISACODYL 5 MG PO TBEC
5.0000 mg | DELAYED_RELEASE_TABLET | Freq: Once | ORAL | Status: AC
Start: 2020-07-25 — End: 2020-07-25
  Administered 2020-07-25: 5 mg via ORAL
  Filled 2020-07-25: qty 1

## 2020-07-25 MED ORDER — NITROGLYCERIN IN D5W 200-5 MCG/ML-% IV SOLN
2.0000 ug/min | INTRAVENOUS | Status: AC
  Administered 2020-07-26: 5 ug/min via INTRAVENOUS
  Filled 2020-07-25: qty 250

## 2020-07-25 MED ORDER — INSULIN REGULAR(HUMAN) IN NACL 100-0.9 UT/100ML-% IV SOLN
INTRAVENOUS | Status: AC
  Administered 2020-07-26: 1 [IU]/h via INTRAVENOUS
  Filled 2020-07-25: qty 100

## 2020-07-25 MED ORDER — CHLORHEXIDINE GLUCONATE 0.12 % MT SOLN
15.0000 mL | Freq: Once | OROMUCOSAL | Status: AC
  Administered 2020-07-26: 15 mL via OROMUCOSAL
  Filled 2020-07-25: qty 15

## 2020-07-25 MED ORDER — TRANEXAMIC ACID (OHS) BOLUS VIA INFUSION
15.0000 mg/kg | INTRAVENOUS | Status: AC
  Administered 2020-07-26: 1909.5 mg via INTRAVENOUS
  Filled 2020-07-25: qty 1910

## 2020-07-25 MED ORDER — CEFAZOLIN SODIUM-DEXTROSE 2-4 GM/100ML-% IV SOLN
2.0000 g | INTRAVENOUS | Status: AC
  Administered 2020-07-26: 2 g via INTRAVENOUS
  Filled 2020-07-25: qty 100

## 2020-07-25 NOTE — Consult Note (Signed)
[address]       [place] [address]             [phone]        Roberto Morrison [place] Medical Record #[id number] Date of Birth: October 10, 1970  Referring: No ref. 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 Evaluation in ED notable for elevated troponins. 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 Echo showed reduced LVEF. No other significant medical problems. Referred for CABG. Has been sx-free since admitted to hospital.   Current Activity/ Functional Status: Patient will be independent with mobility/ambulation, transfers, ADL's, IADL's.   Zubrod Score: At the time of surgery this patient's most appropriate activity status/level should be described as: [x]     0    Normal activity, no symptoms []     1    Restricted in physical strenuous activity but ambulatory, able to do out light work []     2    Ambulatory and capable of self care, unable to do work activities, up and about                 more than 50%  Of the time                            []     3    Only limited self care, in bed greater than 50% of waking hours []     4    Completely disabled, no self care, confined to bed or chair []     5    Moribund  History reviewed. No pertinent past medical history.  Past Surgical History:  Procedure Laterality Date   LEFT HEART CATH AND CORONARY ANGIOGRAPHY N/A 07/22/2020   Procedure: LEFT HEART CATH AND CORONARY ANGIOGRAPHY;  Surgeon: Lorretta Harp, MD;  Location: [facility];  Service: Cardiovascular;  Laterality: N/A;    Social History   Tobacco Use  Smoking Status Never  Smokeless Tobacco Never    Social History   Substance and Sexual  Activity  Alcohol Use Not Currently     Allergies  Allergen Reactions   Prednisone Other (See Comments)    "Hiccups"     Current Facility-Administered Medications  Medication Dose Route Frequency Provider Last Rate Last Admin   0.9 %  sodium chloride infusion  250 mL Intravenous PRN Lorretta Harp, MD       acetaminophen (TYLENOL) tablet 650 mg  650 mg Oral Q4H PRN Lorretta Harp, MD       aspirin EC tablet 81 mg  81 mg Oral Daily Chandrasekhar, Mahesh A, MD   81 mg at 07/25/20 0830   atorvastatin (LIPITOR) tablet 80 mg  80 mg Oral Daily Lorretta Harp, MD   80 mg at 07/25/20 0830   carvedilol (COREG) tablet 6.25 mg  6.25 mg Oral BID WC Arnoldo Lenis, MD   6.25 mg at 07/25/20 1731   [START ON 07/26/2020] ceFAZolin (ANCEF) IVPB 2g/100 mL premix  2 g Intravenous To OR Skeet Simmer, [facility]       [START ON 07/26/2020] ceFAZolin (ANCEF) IVPB 3g/100  mL premix  3 g Intravenous To OR Skeet Simmer, [facility]       Chlorhexidine Gluconate Cloth 2 % PADS 6 each  6 each Topical Q0600 Wonda Olds, MD   6 each at 07/24/20 1700   [START ON 07/26/2020] dexmedetomidine (PRECEDEX) 400 MCG/100ML (4 mcg/mL) infusion  0.1-0.7 mcg/kg/hr Intravenous To OR Skeet Simmer, [facility]       [START ON 07/26/2020] EPINEPHrine (ADRENALIN) 5 mg in NS 250 mL (0.02 mg/mL) premix infusion  0-10 mcg/min Intravenous To OR Skeet Simmer, [facility]       [START ON 07/26/2020] heparin 30,000 units/NS 1000 mL solution for CELLSAVER   Other To OR Skeet Simmer, [facility]       heparin ADULT infusion 100 units/mL (25000 units/265mL)  2,200 Units/hr Intravenous Continuous Erenest Blank, RPH 22 mL/hr at 07/25/20 1637 2,200 Units/hr at 07/25/20 1637   [START ON 07/26/2020] heparin sodium (porcine) 5,000 Units, papaverine 60 mg in electrolyte-A (PLASMALYTE-A PH 7.4) 1,000 mL irrigation   Irrigation To OR Skeet Simmer, [facility]       [START ON 07/26/2020] insulin regular, human (MYXREDLIN) 100 units/ 100 mL infusion   Intravenous To OR  Skeet Simmer, RPH       isosorbide-hydrALAZINE (BIDIL) 20-37.5 MG per tablet 1 tablet  1 tablet Oral TID Arnoldo Lenis, MD   1 tablet at 07/25/20 1731   [START ON 07/26/2020] magnesium sulfate (IV Push/IM) injection 40 mEq  40 mEq Other To OR Skeet Simmer, [facility]       [START ON 07/26/2020] milrinone (PRIMACOR) 20 MG/100 ML (0.2 mg/mL) infusion  0.3 mcg/kg/min Intravenous To OR Skeet Simmer, RPH       morphine 2 MG/ML injection 2 mg  2 mg Intravenous Q1H PRN Lorretta Harp, MD   2 mg at 07/25/20 0010   mupirocin ointment (BACTROBAN) 2 % 1 application  1 application Nasal BID Wonda Olds, MD   1 application at 50/09/38 0831   nitroGLYCERIN (NITROSTAT) SL tablet 0.4 mg  0.4 mg Sublingual Q5 Min x 3 PRN Lorretta Harp, MD       [START ON 07/26/2020] nitroGLYCERIN 50 mg in dextrose 5 % 250 mL (0.2 mg/mL) infusion  2-200 mcg/min Intravenous To OR Skeet Simmer, [facility]       [START ON 07/26/2020] norepinephrine (LEVOPHED) 4mg  in 241mL premix infusion  0-40 mcg/min Intravenous To OR Skeet Simmer, RPH       ondansetron [facility]) injection 4 mg  4 mg Intravenous Q6H PRN Lorretta Harp, MD       [START ON 07/26/2020] phenylephrine (NEOSYNEPHRINE) 20-0.9 MG/250ML-% infusion  30-200 mcg/min Intravenous To OR Skeet Simmer, [facility]       [START ON 07/26/2020] potassium chloride injection 80 mEq  80 mEq Other To OR Skeet Simmer, RPH       sodium chloride flush (NS) 0.9 % injection 3 mL  3 mL Intravenous Q12H Lorretta Harp, MD   3 mL at 07/25/20 1829   sodium chloride flush (NS) 0.9 % injection 3 mL  3 mL Intravenous Q12H Lorretta Harp, MD   3 mL at 07/25/20 9371   sodium chloride flush (NS) 0.9 % injection 3 mL  3 mL Intravenous PRN Lorretta Harp, MD       [START ON 07/26/2020] tranexamic acid (CYKLOKAPRON) 2,500 mg in sodium chloride 0.9 % 250 mL (10 mg/mL) infusion  1.5 mg/kg/hr Intravenous To OR Elana Alm,  Belia Heman, [facility]       [START ON 07/26/2020] tranexamic acid (CYKLOKAPRON) bolus  via infusion - over 30 minutes 1,909.5 mg  15 mg/kg Intravenous To OR Skeet Simmer, [facility]       [START ON 07/26/2020] tranexamic acid (CYKLOKAPRON) pump prime solution 255 mg  2 mg/kg Intracatheter To OR Skeet Simmer, [facility]       [START ON 07/26/2020] vancomycin (VANCOREADY) IVPB 1500 mg/300 mL  1,500 mg Intravenous To OR Skeet Simmer, [facility]        No medications prior to admission.    No family history on file.   Review of Systems:   ROS Pertinent items noted in HPI and remainder of comprehensive ROS otherwise negative.     Cardiac Review of Systems: Y or  [    ]= no  Chest Pain [    ]  Resting SOB [   ] Exertional SOB  [  ]  Orthopnea [  ]   Pedal Edema [   ]    Palpitations [  ] Syncope  [  ]   Presyncope [   ]  General Review of Systems: [Y] = yes [  ]=no Constitional: recent weight change [  ]; anorexia [  ]; fatigue [  ]; nausea [  ]; night sweats [  ]; fever [  ]; or chills [  ]                                                               Dental: Last Dentist visit:   Eye : blurred vision [  ]; diplopia [   ]; vision changes [  ];  Amaurosis fugax[  ]; Resp: cough [  ];  wheezing[  ];  hemoptysis[  ]; shortness of breath[  ]; paroxysmal nocturnal dyspnea[  ]; dyspnea on exertion[  ]; or orthopnea[  ];  GI:  gallstones[  ], vomiting[  ];  dysphagia[  ]; melena[  ];  hematochezia [  ]; heartburn[  ];   Hx of  Colonoscopy[  ]; GU: kidney stones [  ]; hematuria[  ];   dysuria [  ];  nocturia[  ];  history of     obstruction [  ]; urinary frequency [  ]             Skin: rash, swelling[  ];, hair loss[  ];  peripheral edema[  ];  or itching[  ]; Musculosketetal: myalgias[  ];  joint swelling[  ];  joint erythema[  ];  joint pain[  ];  back pain[  ];  Heme/Lymph: bruising[  ];  bleeding[  ];  anemia[  ];  Neuro: TIA[  ];  headaches[  ];  stroke[  ];  vertigo[  ];  seizures[  ];   paresthesias[  ];  difficulty walking[  ];  Psych:depression[  ]; anxiety[  ];  Endocrine: diabetes[   ];  thyroid dysfunction[  ];                Physical Exam: BP 127/68 (BP Location: Right Wrist)   Pulse 73   Temp 97.6 F (36.4 C) (Oral)   Resp 17   Ht 6' (1.829 m)   Wt 127.3 kg   SpO2  97%   BMI 38.06 kg/m    General appearance: alert and cooperative Head: Normocephalic, without obvious abnormality, atraumatic Neck: no adenopathy, no carotid bruit, no JVD, supple, symmetrical, trachea midline, and thyroid not enlarged, symmetric, no tenderness/mass/nodules Resp: clear to auscultation bilaterally Cardio: regular rate and rhythm, S1, S2 normal, no murmur, click, rub or gallop GI: soft, non-tender; bowel sounds normal; no masses,  no organomegaly Extremities: extremities normal, atraumatic, no cyanosis or edema Neurologic: Alert and oriented X 3, normal strength and tone. Normal symmetric reflexes. Normal coordination and gait  Diagnostic Studies & Laboratory data:     Recent Radiology Findings:   VAS US DOPPLER PRE CABG  Result Date: 07/24/2020 PREOPERATIVE VASCULAR EVALUATION Patient Name:  Roberto Morrison  Date of Exam:   07/24/2020 Medical Rec #: [id number]      Accession #:    [id number] Date of Birth: Aug 26, 1970       Patient Gender: M Patient Age:   44Y Exam Location:  [facility] Procedure:      VAS US DOPPLER PRE CABG Referring Phys: [id number] [place] --------------------------------------------------------------------------------  Indications:  Pre-CABG. Risk Factors: No history of smoking, prior MI, coronary artery disease. Performing Technologist: Rogelia Rohrer RVT, RDMS  Examination Guidelines: A complete evaluation includes B-mode imaging, spectral Doppler, color Doppler, and power Doppler as needed of all accessible portions of each vessel. Bilateral testing is considered an integral part of a complete examination. Limited examinations for reoccurring indications may be performed as noted.  Right Carotid Findings:  +----------+--------+--------+--------+--------+------------------+           PSV cm/sEDV cm/sStenosisDescribeComments           +----------+--------+--------+--------+--------+------------------+ CCA Prox  55      8                                          +----------+--------+--------+--------+--------+------------------+ CCA Distal63      7                       intimal thickening +----------+--------+--------+--------+--------+------------------+ ICA Prox  45      19                                         +----------+--------+--------+--------+--------+------------------+ ICA Distal44      19                                         +----------+--------+--------+--------+--------+------------------+ ECA       83      7                                          +----------+--------+--------+--------+--------+------------------+ Portions of this table do not appear on this page. +----------+--------+-------+----------------+------------+           PSV cm/sEDV cmsDescribe        Arm Pressure +----------+--------+-------+----------------+------------+ Subclavian88             Multiphasic, WNL             +----------+--------+-------+----------------+------------+ +---------+--------+--+--------+--+---------+ VertebralPSV cm/s32EDV cm/s10Antegrade +---------+--------+--+--------+--+---------+ Left Carotid Findings: +----------+--------+--------+--------+--------+------------------+  PSV cm/sEDV cm/sStenosisDescribeComments           +----------+--------+--------+--------+--------+------------------+ CCA Prox  59      12                      intimal thickening +----------+--------+--------+--------+--------+------------------+ CCA Distal59      11                      intimal thickening +----------+--------+--------+--------+--------+------------------+ ICA Prox  45      20                                          +----------+--------+--------+--------+--------+------------------+ ICA Distal43      19                                         +----------+--------+--------+--------+--------+------------------+ ECA       79      0                                          +----------+--------+--------+--------+--------+------------------+ +----------+--------+--------+----------------+------------+ SubclavianPSV cm/sEDV cm/sDescribe        Arm Pressure +----------+--------+--------+----------------+------------+           87              Multiphasic, WNL             +----------+--------+--------+----------------+------------+ +---------+--------+--+--------+--+---------+ VertebralPSV cm/s38EDV cm/s10Antegrade +---------+--------+--+--------+--+---------+  ABI Findings: +--------+------------------+-----+---------+--------+ Right   Rt Pressure (mmHg)IndexWaveform Comment  +--------+------------------+-----+---------+--------+ [id number]                    triphasic         +--------+------------------+-----+---------+--------+ PTA     196               1.38 triphasic         +--------+------------------+-----+---------+--------+ DP      179               1.26 triphasic         +--------+------------------+-----+---------+--------+ +--------+------------------+-----+---------+-------+ Left    Lt Pressure (mmHg)IndexWaveform Comment +--------+------------------+-----+---------+-------+ Brachial                       triphasicIV      +--------+------------------+-----+---------+-------+ PTA     198               1.39 triphasic        +--------+------------------+-----+---------+-------+ DP      195               1.37 triphasic        +--------+------------------+-----+---------+-------+ Arterial wall calcification precludes accurate ankle pressures and ABIs.  Right Doppler Findings: +--------+--------+-----+---------+--------+ Site     PressureIndexDoppler  Comments +--------+--------+-----+---------+--------+ [id number]          triphasic         +--------+--------+-----+---------+--------+ Radial               triphasic         +--------+--------+-----+---------+--------+ Ulnar                triphasic         +--------+--------+-----+---------+--------+  Left Doppler Findings: +--------+--------+-----+---------+--------+  Site    PressureIndexDoppler  Comments +--------+--------+-----+---------+--------+ Brachial             triphasicIV       +--------+--------+-----+---------+--------+ Radial               triphasic         +--------+--------+-----+---------+--------+ Ulnar                triphasic         +--------+--------+-----+---------+--------+  Summary: Right Carotid: The extracranial vessels were near-normal with only minimal wall                thickening or plaque. Left Carotid: The extracranial vessels were near-normal with only minimal wall               thickening or plaque. Vertebrals:  Bilateral vertebral arteries demonstrate antegrade flow. Subclavians: Normal flow hemodynamics were seen in bilateral subclavian              arteries. Right ABI: ABIs elevated likely due to vessel calcification however, doppler waveforms are triphasic. Left ABI: ABIs elevated likely due to vessel calcification however, doppler waveforms are triphasic. Right Upper Extremity: Doppler waveforms remain within normal limits with right radial compression. Doppler waveforms remain within normal limits with right ulnar compression. Left Upper Extremity: Doppler waveforms remain within normal limits with left radial compression. Doppler waveform obliterate with left ulnar compression.  Electronically signed by Harold Barban MD on 07/24/2020 at 9:30:11 PM.    Final      I have independently reviewed the above radiologic studies and discussed with the patient   Recent Lab Findings: Lab Results  Component  Value Date   WBC 6.6 07/25/2020   HGB 15.3 07/25/2020   HCT 45.5 07/25/2020   PLT 193 07/25/2020   GLUCOSE 77 07/25/2020   CHOL 171 07/25/2020   TRIG 71 07/25/2020   HDL 32 (L) 07/25/2020   LDLCALC 125 (H) 07/25/2020   ALT 101 (H) 12/28/2013   AST 60 (H) 12/28/2013   NA 139 07/25/2020   K 4.1 07/25/2020   CL 106 07/25/2020   CREATININE 1.30 (H) 07/25/2020   BUN 15 07/25/2020   CO2 25 07/25/2020   INR 1.1 07/21/2020      Assessment / Plan:      50 yo man with severe Lcx and long segment LAD disease. Agree with plan for CABG. Suggest all-arterial grafting strategy. Plan OR on 07/26/20.    I  spent 60 minutes counseling the patient face to face.   Evetta Renner Z. Orvan Seen, [place]  07/25/2020 5:32 PM

## 2020-07-25 NOTE — Progress Notes (Signed)
Discussed IS, sternal precautions, mobility post op, and d/c planning. Pt and family with many appropriate questions. He will have family at home at d/c. 2500+ ml on IS. He will be eager to begin walking.  [place], ACSM 3:13 PM 07/25/2020

## 2020-07-25 NOTE — Progress Notes (Signed)
ANTICOAGULATION CONSULT NOTE   Pharmacy Consult for heparin Indication: chest pain/ACS  Allergies  Allergen Reactions   Prednisone Other (See Comments)    "Hiccups"     Patient Measurements: Height: 6' (182.9 cm) Weight: 129.5 kg (285 lb 6.4 oz) IBW/kg (Calculated) : 77.6 kg Heparin Dosing Weight: 107 kg  Vital Signs: Temp: 97.6 F (36.4 C) (06/19 2037) Temp Source: Oral (06/19 2037) BP: 127/68 (06/19 2037)  Labs: Recent Labs    07/22/20 0920 07/23/20 0605 07/23/20 1623 07/23/20 2327 07/24/20 0817 07/24/20 1501  HGB  --  15.6  --   --  16.1  --   HCT  --  45.9  --   --  47.3  --   PLT  --  181  --   --  201  --   HEPARINUNFRC 0.16* <0.10*   < > 0.20* 0.41 0.38  CREATININE 1.35* 1.44*  --   --   --   --    < > = values in this interval not displayed.     Estimated Creatinine Clearance: 86.4 mL/min (A) (by C-G formula based on SCr of 1.44 mg/dL (H)).   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 The patient is now planned for CABG on 6/21. PTA the patient is not on anticoagulation. Pharmacy is consulted to dose heparin.  The heparin level is therapeutic at 0.31 while running at 2200 units/hr. Per the RN, there have been no issues with the infusion and the patient is without signs or symptoms of bleeding. CBC is WNL and stable.   Goal of Therapy:  Heparin level 0.3-0.7 units/ml Monitor platelets by anticoagulation protocol: Yes   Plan:  -Continue heparin IV at 2200 units/hr -Obtain a daily heparin level and CBC -Monitor for signs and symptoms of bleeding -Plan for surgery in the morning  Erin Hearing PharmD., BCPS Clinical Pharmacist 07/25/2020 7:35 AM   Please check [email or web address] for unit-specific pharmacy phone numbers.

## 2020-07-25 NOTE — Progress Notes (Signed)
Progress Note  Patient Name: Roberto Morrison Date of Encounter: 07/25/2020  [facility] Cardiologist: Quay Burow, MD   Subjective   No recurrent chest pain.  He denies any shortness of breath.  Inpatient Medications    Scheduled Meds:  aspirin EC  81 mg Oral Daily   atorvastatin  80 mg Oral Daily   carvedilol  6.25 mg Oral BID WC   Chlorhexidine Gluconate Cloth  6 each Topical Q0600   [START ON 07/26/2020] epinephrine  0-10 mcg/min Intravenous To OR   [START ON 07/26/2020] heparin-papaverine-plasmalyte irrigation   Irrigation To OR   [START ON 07/26/2020] insulin   Intravenous To OR   isosorbide-hydrALAZINE  1 tablet Oral TID   [START ON 07/26/2020] magnesium sulfate  40 mEq Other To OR   mupirocin ointment  1 application Nasal BID   [START ON 07/26/2020] phenylephrine  30-200 mcg/min Intravenous To OR   [START ON 07/26/2020] potassium chloride  80 mEq Other To OR   sodium chloride flush  3 mL Intravenous Q12H   sodium chloride flush  3 mL Intravenous Q12H   [START ON 07/26/2020] tranexamic acid  15 mg/kg Intravenous To OR   [START ON 07/26/2020] tranexamic acid  2 mg/kg Intracatheter To OR   Continuous Infusions:  sodium chloride     [START ON 07/26/2020]  ceFAZolin (ANCEF) IV     [START ON 07/26/2020]  ceFAZolin (ANCEF) IV     [START ON 07/26/2020] dexmedetomidine     [START ON 07/26/2020] heparin 30,000 units/NS 1000 mL solution for CELLSAVER     heparin 2,200 Units/hr (07/25/20 0754)   [START ON 07/26/2020] milrinone     [START ON 07/26/2020] nitroGLYCERIN     [START ON 07/26/2020] norepinephrine     [START ON 07/26/2020] tranexamic acid (CYKLOKAPRON) infusion ([facility])     [START ON 07/26/2020] vancomycin     PRN Meds: sodium chloride, acetaminophen, morphine injection, nitroGLYCERIN, ondansetron (ZOFRAN) IV, sodium chloride flush   Vital Signs    Vitals:   07/24/20 0845 07/24/20 1705 07/24/20 2037 07/25/20 0759  BP: 123/87 (!) 123/56 127/68   Pulse: 84 73    Resp: 19 19  17    Temp:  97.6 F (36.4 C) 97.6 F (36.4 C)   TempSrc:  Oral Oral   SpO2:      Weight:    127.3 kg  Height:        Intake/Output Summary (Last 24 hours) at 07/25/2020 0921 Last data filed at 07/24/2020 2359 Gross per 24 hour  Intake 426.26 ml  Output --  Net 426.26 ml   Last 3 Weights 07/25/2020 07/21/2020 07/21/2020  Weight (lbs) 280 lb 10.3 oz 285 lb 6.4 oz 288 lb  Weight (kg) 127.3 kg 129.457 kg 130.636 kg      Telemetry    Sinus rhythm in the 80;  personally Reviewed  ECG    July 22, 2020 ECG (independently read by me): Normal sinus rhythm at 87 bpm, nonspecific T wave abnormality.  Physical Exam   GEN: No acute distress.  Mesomorphic, muscular gentleman Neck: No JVD; Thick neck Cardiac: RRR, no murmurs, rubs, or gallops.  Respiratory: Clear to auscultation bilaterally. GI: Soft, nontender, non-distended  MS: No edema; No deformity. Neuro:  Nonfocal  Psych: Normal affect   Labs    High Sensitivity Troponin:   Recent Labs  Lab 07/21/20 1555 07/21/20 1739 07/21/20 1951 07/21/20 2213 07/22/20 0028  TROPONINIHS 150* 270* 679* 1,161* 1,796*      Chemistry Recent  Labs  Lab 07/21/20 2213 07/22/20 0920 07/23/20 0605  NA 137 140 140  K 3.5 4.2 4.0  CL 104 106 108  CO2 26 27 24   GLUCOSE 138* 94 119*  BUN 15 12 13   CREATININE 1.31* 1.35* 1.44*  CALCIUM 9.0 9.2 8.7*  GFRNONAA >60 >60 60*  ANIONGAP 7 7 8      Hematology Recent Labs  Lab 07/22/20 0237 07/23/20 0605 07/24/20 0817  WBC 8.3 6.8 8.0  RBC 4.83 4.83 4.97  HGB 15.6 15.6 16.1  HCT 44.9 45.9 47.3  MCV 93.0 95.0 95.2  MCH 32.3 32.3 32.4  MCHC 34.7 34.0 34.0  RDW 14.4 14.7 14.6  PLT 193 181 201    BNPNo results for input(s): BNP, PROBNP in the last 168 hours.   DDimer No results for input(s): DDIMER in the last 168 hours.   Radiology    VAS US DOPPLER PRE CABG  Result Date: 07/24/2020 PREOPERATIVE VASCULAR EVALUATION Patient Name:  Roberto Morrison  Date of Exam:   07/24/2020  Medical Rec #: [id number]      Accession #:    [id number] Date of Birth: 10/11/1970       Patient Gender: M Patient Age:   50Y Exam Location:  [facility] Procedure:      VAS US DOPPLER PRE CABG Referring Phys: [id number] [place] --------------------------------------------------------------------------------  Indications:  Pre-CABG. Risk Factors: No history of smoking, prior MI, coronary artery disease. Performing Technologist: Rogelia Rohrer RVT, RDMS  Examination Guidelines: A complete evaluation includes B-mode imaging, spectral Doppler, color Doppler, and power Doppler as needed of all accessible portions of each vessel. Bilateral testing is considered an integral part of a complete examination. Limited examinations for reoccurring indications may be performed as noted.  Right Carotid Findings: +----------+--------+--------+--------+--------+------------------+           PSV cm/sEDV cm/sStenosisDescribeComments           +----------+--------+--------+--------+--------+------------------+ CCA Prox  55      8                                          +----------+--------+--------+--------+--------+------------------+ CCA Distal63      7                       intimal thickening +----------+--------+--------+--------+--------+------------------+ ICA Prox  45      19                                         +----------+--------+--------+--------+--------+------------------+ ICA Distal44      19                                         +----------+--------+--------+--------+--------+------------------+ ECA       83      7                                          +----------+--------+--------+--------+--------+------------------+ Portions of this table do not appear on this page. +----------+--------+-------+----------------+------------+           PSV cm/sEDV cmsDescribe  Arm Pressure +----------+--------+-------+----------------+------------+ Subclavian88              Multiphasic, WNL             +----------+--------+-------+----------------+------------+ +---------+--------+--+--------+--+---------+ VertebralPSV cm/s32EDV cm/s10Antegrade +---------+--------+--+--------+--+---------+ Left Carotid Findings: +----------+--------+--------+--------+--------+------------------+           PSV cm/sEDV cm/sStenosisDescribeComments           +----------+--------+--------+--------+--------+------------------+ CCA Prox  59      12                      intimal thickening +----------+--------+--------+--------+--------+------------------+ CCA Distal59      11                      intimal thickening +----------+--------+--------+--------+--------+------------------+ ICA Prox  45      20                                         +----------+--------+--------+--------+--------+------------------+ ICA Distal43      19                                         +----------+--------+--------+--------+--------+------------------+ ECA       79      0                                          +----------+--------+--------+--------+--------+------------------+ +----------+--------+--------+----------------+------------+ SubclavianPSV cm/sEDV cm/sDescribe        Arm Pressure +----------+--------+--------+----------------+------------+           87              Multiphasic, WNL             +----------+--------+--------+----------------+------------+ +---------+--------+--+--------+--+---------+ VertebralPSV cm/s38EDV cm/s10Antegrade +---------+--------+--+--------+--+---------+  ABI Findings: +--------+------------------+-----+---------+--------+ Right   Rt Pressure (mmHg)IndexWaveform Comment  +--------+------------------+-----+---------+--------+ [id number]                    triphasic         +--------+------------------+-----+---------+--------+ PTA     196               1.38 triphasic          +--------+------------------+-----+---------+--------+ DP      179               1.26 triphasic         +--------+------------------+-----+---------+--------+ +--------+------------------+-----+---------+-------+ Left    Lt Pressure (mmHg)IndexWaveform Comment +--------+------------------+-----+---------+-------+ Brachial                       triphasicIV      +--------+------------------+-----+---------+-------+ PTA     198               1.39 triphasic        +--------+------------------+-----+---------+-------+ DP      195               1.37 triphasic        +--------+------------------+-----+---------+-------+ Arterial wall calcification precludes accurate ankle pressures and ABIs.  Right Doppler Findings: +--------+--------+-----+---------+--------+ Site    PressureIndexDoppler  Comments +--------+--------+-----+---------+--------+ [id number]          triphasic         +--------+--------+-----+---------+--------+ Radial  triphasic         +--------+--------+-----+---------+--------+ Ulnar                triphasic         +--------+--------+-----+---------+--------+  Left Doppler Findings: +--------+--------+-----+---------+--------+ Site    PressureIndexDoppler  Comments +--------+--------+-----+---------+--------+ Brachial             triphasicIV       +--------+--------+-----+---------+--------+ Radial               triphasic         +--------+--------+-----+---------+--------+ Ulnar                triphasic         +--------+--------+-----+---------+--------+  Summary: Right Carotid: The extracranial vessels were near-normal with only minimal wall                thickening or plaque. Left Carotid: The extracranial vessels were near-normal with only minimal wall               thickening or plaque. Vertebrals:  Bilateral vertebral arteries demonstrate antegrade flow. Subclavians: Normal flow hemodynamics were seen in  bilateral subclavian              arteries. Right ABI: ABIs elevated likely due to vessel calcification however, doppler waveforms are triphasic. Left ABI: ABIs elevated likely due to vessel calcification however, doppler waveforms are triphasic. Right Upper Extremity: Doppler waveforms remain within normal limits with right radial compression. Doppler waveforms remain within normal limits with right ulnar compression. Left Upper Extremity: Doppler waveforms remain within normal limits with left radial compression. Doppler waveform obliterate with left ulnar compression.  Electronically signed by Harold Barban MD on 07/24/2020 at 9:30:11 PM.    Final     Cardiac Studies   Echo: 07/22/20  IMPRESSIONS     1. Left ventricular ejection fraction, by estimation, is 30 to 35%. The  left ventricle has moderately decreased function. The left ventricle  demonstrates regional wall motion abnormalities (see scoring  diagram/findings for description). Left ventricular   diastolic parameters are consistent with Grade I diastolic dysfunction  (impaired relaxation). There is severe hypokinesis of the left  ventricular, mid-apical anteroseptal wall.   2. Right ventricular systolic function is normal. The right ventricular  size is normal.   3. The mitral valve is normal in structure. Trivial mitral valve  regurgitation. No evidence of mitral stenosis.   4. The aortic valve is normal in structure. Aortic valve regurgitation is  not visualized. No aortic stenosis is present.   Cath: 07/22/20  IMPRESSION: Mr. Mehta was admitted with a non-STEMI.  He has three-vessel disease.  He has a long segmental mid LAD lesion and a moderately long mid AV groove circumflex lesion lesion with what appears to be a moderate though not significant distal RCA lesion.  His filling pressures were normal.  His EF looked preserved.  I believe he would be best served with coronary artery bypass grafting with bilateral IMA's.  Surgical  consult has been called.  The sheath was removed and a TR band was placed on the right wrist to achieve patent hemostasis.  The patient left the lab in stable condition.  Plan will be to restart IV heparin 4 hours after sheath removal.   Quay Burow. MD, [facility] 07/22/2020 4:11 PM  Diagnostic Dominance: Right    Patient Profile     50 y.o. male with no PMH who presented with chest pain after being at the gym and noted  to have NSTEMI.  Assessment & Plan    NSTEMI: hsTn peaked at 1796. Underwent cardiac cath noted above with 3v disease, long segmental mLAD lesion of 99% and long mid Lcx of 90%, 50% mRCA. Recommendations for TCTS consult for CABG. Planned for surgery tomorrow -- remains on IV heparin, ASA, statin, BB    ICM: Echo noted EF of 30-35% with G1DD, severe hypokinesis of the LV, mid-apical anteroseptal wall. No signs of volume overload -- continue on coreg 6.25mg  BID and BiDil TID. Will need to consider Entresto/SGLT2 prior to discharge for GDMT  HLD: check lipids in am -- on high dose statin  HTN: stable with coreg and BiDil    For questions or updates, please contact [place] Please consult [email or web address] for contact info under        Signed, Reino Bellis, NP  07/25/2020, 9:21 AM     Patient seen and examined. Agree with assessment and plan.  No recurrent chest pain.  I have personally reviewed the catheterization images.  Dr. Orvan Seen has seen the patient this morning.  Patient is maintaining sinus rhythm with current heart rate in the 80s..  Acute EF is depressed at 30 - 35%. He is on carvedilol 6.25 mg twice a day and BiDil 20/37.5 mg every 8 hours; Troponins positive at 1796 with non-STEMI. 1.46 on July 21, 2020.  Recheck toda is apparently no other creatinines have been finalized despite orders written. Pt  admits that he has been on IV testosterone for over 10 years followed by a physician in [place].  Hopefully LV function will improve following  revascularization with possible bilateral internal mammary arteries and SVG to RCA.  If LV function remains low, will need to initiate Entresto and other medications for guideline directed medical therapy.  Patient continues to be on IV heparin.  Heparin level pending.  Troy Sine, MD, [facility] 07/25/2020 11:22 AM

## 2020-07-26 ENCOUNTER — Inpatient Hospital Stay (HOSPITAL_COMMUNITY): Payer: 59 | Admitting: Anesthesiology

## 2020-07-26 ENCOUNTER — Inpatient Hospital Stay (HOSPITAL_COMMUNITY): Payer: 59

## 2020-07-26 ENCOUNTER — Inpatient Hospital Stay (HOSPITAL_COMMUNITY)
Admission: EM | Disposition: A | Discharge status: Home / Self Care | Payer: Self-pay | Source: Home / Self Care | Attending: Internal Medicine

## 2020-07-26 DIAGNOSIS — I219 Acute myocardial infarction, unspecified: Secondary | ICD-10-CM

## 2020-07-26 DIAGNOSIS — I25110 Atherosclerotic heart disease of native coronary artery with unstable angina pectoris: Secondary | ICD-10-CM

## 2020-07-26 DIAGNOSIS — Z951 Presence of aortocoronary bypass graft: Secondary | ICD-10-CM

## 2020-07-26 HISTORY — PX: TEE WITHOUT CARDIOVERSION: SHX5443

## 2020-07-26 HISTORY — DX: Acute myocardial infarction, unspecified: I21.9

## 2020-07-26 HISTORY — PX: CORONARY ARTERY BYPASS GRAFT: SHX141

## 2020-07-26 HISTORY — PX: RADIAL ARTERY HARVEST: SHX5067

## 2020-07-26 HISTORY — PX: INTERCOSTAL NERVE BLOCK: SHX5021

## 2020-07-26 LAB — CBC
HCT: 42.4 % (ref 39.0–52.0)
HCT: 39.7 % (ref 39.0–52.0)
HCT: 38.7 % — ABNORMAL LOW (ref 39.0–52.0)
Hemoglobin: 14.8 g/dL (ref 13.0–17.0)
Hemoglobin: 13.3 g/dL (ref 13.0–17.0)
Hemoglobin: 13.3 g/dL (ref 13.0–17.0)
MCH: 32.7 pg (ref 26.0–34.0)
MCH: 32.4 pg (ref 26.0–34.0)
MCH: 32.5 pg (ref 26.0–34.0)
MCHC: 34.9 g/dL (ref 30.0–36.0)
MCHC: 33.5 g/dL (ref 30.0–36.0)
MCHC: 34.4 g/dL (ref 30.0–36.0)
MCV: 93.6 fL (ref 80.0–100.0)
MCV: 96.6 fL (ref 80.0–100.0)
MCV: 94.6 fL (ref 80.0–100.0)
Platelets: 165 10*3/uL (ref 150–400)
Platelets: 128 10*3/uL — ABNORMAL LOW (ref 150–400)
Platelets: 133 10*3/uL — ABNORMAL LOW (ref 150–400)
RBC: 4.53 MIL/uL (ref 4.22–5.81)
RBC: 4.11 MIL/uL — ABNORMAL LOW (ref 4.22–5.81)
RBC: 4.09 MIL/uL — ABNORMAL LOW (ref 4.22–5.81)
RDW: 14.4 % (ref 11.5–15.5)
RDW: 14.3 % (ref 11.5–15.5)
RDW: 13.9 % (ref 11.5–15.5)
WBC: 6.3 10*3/uL (ref 4.0–10.5)
WBC: 11.3 10*3/uL — ABNORMAL HIGH (ref 4.0–10.5)
WBC: 11.9 10*3/uL — ABNORMAL HIGH (ref 4.0–10.5)
nRBC: 0 % (ref 0.0–0.2)
nRBC: 0 % (ref 0.0–0.2)
nRBC: 0 % (ref 0.0–0.2)

## 2020-07-26 LAB — POCT I-STAT EG7
Acid-Base Excess: 0 mmol/L (ref 0.0–2.0)
Bicarbonate: 27.2 mmol/L (ref 20.0–28.0)
Calcium, Ion: 1.16 mmol/L (ref 1.15–1.40)
HCT: 38 % — ABNORMAL LOW (ref 39.0–52.0)
Hemoglobin: 12.9 g/dL — ABNORMAL LOW (ref 13.0–17.0)
O2 Saturation: 76 %
Potassium: 5.3 mmol/L — ABNORMAL HIGH (ref 3.5–5.1)
Sodium: 139 mmol/L (ref 135–145)
TCO2: 29 mmol/L (ref 22–32)
pCO2, Ven: 57.1 mmHg (ref 44.0–60.0)
pH, Ven: 7.286 (ref 7.250–7.430)
pO2, Ven: 47 mmHg — ABNORMAL HIGH (ref 32.0–45.0)

## 2020-07-26 LAB — POCT I-STAT, CHEM 8
BUN: 15 mg/dL (ref 6–20)
BUN: 15 mg/dL (ref 6–20)
BUN: 14 mg/dL (ref 6–20)
BUN: 15 mg/dL (ref 6–20)
BUN: 15 mg/dL (ref 6–20)
Calcium, Ion: 1.31 mmol/L (ref 1.15–1.40)
Calcium, Ion: 1.21 mmol/L (ref 1.15–1.40)
Calcium, Ion: 1.12 mmol/L — ABNORMAL LOW (ref 1.15–1.40)
Calcium, Ion: 1.14 mmol/L — ABNORMAL LOW (ref 1.15–1.40)
Calcium, Ion: 1.16 mmol/L (ref 1.15–1.40)
Chloride: 103 mmol/L (ref 98–111)
Chloride: 104 mmol/L (ref 98–111)
Chloride: 103 mmol/L (ref 98–111)
Chloride: 104 mmol/L (ref 98–111)
Chloride: 104 mmol/L (ref 98–111)
Creatinine, Ser: 1.3 mg/dL — ABNORMAL HIGH (ref 0.61–1.24)
Creatinine, Ser: 1.3 mg/dL — ABNORMAL HIGH (ref 0.61–1.24)
Creatinine, Ser: 1 mg/dL (ref 0.61–1.24)
Creatinine, Ser: 1.1 mg/dL (ref 0.61–1.24)
Creatinine, Ser: 1.3 mg/dL — ABNORMAL HIGH (ref 0.61–1.24)
Glucose, Bld: 104 mg/dL — ABNORMAL HIGH (ref 70–99)
Glucose, Bld: 136 mg/dL — ABNORMAL HIGH (ref 70–99)
Glucose, Bld: 129 mg/dL — ABNORMAL HIGH (ref 70–99)
Glucose, Bld: 132 mg/dL — ABNORMAL HIGH (ref 70–99)
Glucose, Bld: 138 mg/dL — ABNORMAL HIGH (ref 70–99)
HCT: 42 % (ref 39.0–52.0)
HCT: 42 % (ref 39.0–52.0)
HCT: 37 % — ABNORMAL LOW (ref 39.0–52.0)
HCT: 37 % — ABNORMAL LOW (ref 39.0–52.0)
HCT: 35 % — ABNORMAL LOW (ref 39.0–52.0)
Hemoglobin: 14.3 g/dL (ref 13.0–17.0)
Hemoglobin: 14.3 g/dL (ref 13.0–17.0)
Hemoglobin: 12.6 g/dL — ABNORMAL LOW (ref 13.0–17.0)
Hemoglobin: 12.6 g/dL — ABNORMAL LOW (ref 13.0–17.0)
Hemoglobin: 11.9 g/dL — ABNORMAL LOW (ref 13.0–17.0)
Potassium: 4.1 mmol/L (ref 3.5–5.1)
Potassium: 4.9 mmol/L (ref 3.5–5.1)
Potassium: 5.4 mmol/L — ABNORMAL HIGH (ref 3.5–5.1)
Potassium: 5.7 mmol/L — ABNORMAL HIGH (ref 3.5–5.1)
Potassium: 5.8 mmol/L — ABNORMAL HIGH (ref 3.5–5.1)
Sodium: 139 mmol/L (ref 135–145)
Sodium: 138 mmol/L (ref 135–145)
Sodium: 137 mmol/L (ref 135–145)
Sodium: 138 mmol/L (ref 135–145)
Sodium: 137 mmol/L (ref 135–145)
TCO2: 31 mmol/L (ref 22–32)
TCO2: 24 mmol/L (ref 22–32)
TCO2: 24 mmol/L (ref 22–32)
TCO2: 25 mmol/L (ref 22–32)
TCO2: 25 mmol/L (ref 22–32)

## 2020-07-26 LAB — POCT I-STAT 7, (LYTES, BLD GAS, ICA,H+H)
Acid-Base Excess: 0 mmol/L (ref 0.0–2.0)
Acid-Base Excess: 0 mmol/L (ref 0.0–2.0)
Acid-base deficit: 1 mmol/L (ref 0.0–2.0)
Acid-base deficit: 1 mmol/L (ref 0.0–2.0)
Acid-base deficit: 3 mmol/L — ABNORMAL HIGH (ref 0.0–2.0)
Acid-base deficit: 2 mmol/L (ref 0.0–2.0)
Acid-base deficit: 3 mmol/L — ABNORMAL HIGH (ref 0.0–2.0)
Acid-base deficit: 3 mmol/L — ABNORMAL HIGH (ref 0.0–2.0)
Acid-base deficit: 5 mmol/L — ABNORMAL HIGH (ref 0.0–2.0)
Bicarbonate: 27.1 mmol/L (ref 20.0–28.0)
Bicarbonate: 25.4 mmol/L (ref 20.0–28.0)
Bicarbonate: 26.1 mmol/L (ref 20.0–28.0)
Bicarbonate: 24 mmol/L (ref 20.0–28.0)
Bicarbonate: 25.2 mmol/L (ref 20.0–28.0)
Bicarbonate: 24.8 mmol/L (ref 20.0–28.0)
Bicarbonate: 22.8 mmol/L (ref 20.0–28.0)
Bicarbonate: 22.6 mmol/L (ref 20.0–28.0)
Bicarbonate: 21 mmol/L (ref 20.0–28.0)
Calcium, Ion: 1.32 mmol/L (ref 1.15–1.40)
Calcium, Ion: 1.1 mmol/L — ABNORMAL LOW (ref 1.15–1.40)
Calcium, Ion: 1.15 mmol/L (ref 1.15–1.40)
Calcium, Ion: 1.18 mmol/L (ref 1.15–1.40)
Calcium, Ion: 1.31 mmol/L (ref 1.15–1.40)
Calcium, Ion: 1.26 mmol/L (ref 1.15–1.40)
Calcium, Ion: 1.26 mmol/L (ref 1.15–1.40)
Calcium, Ion: 1.25 mmol/L (ref 1.15–1.40)
Calcium, Ion: 1.19 mmol/L (ref 1.15–1.40)
HCT: 43 % (ref 39.0–52.0)
HCT: 35 % — ABNORMAL LOW (ref 39.0–52.0)
HCT: 35 % — ABNORMAL LOW (ref 39.0–52.0)
HCT: 36 % — ABNORMAL LOW (ref 39.0–52.0)
HCT: 36 % — ABNORMAL LOW (ref 39.0–52.0)
HCT: 40 % (ref 39.0–52.0)
HCT: 41 % (ref 39.0–52.0)
HCT: 43 % (ref 39.0–52.0)
HCT: 40 % (ref 39.0–52.0)
Hemoglobin: 14.6 g/dL (ref 13.0–17.0)
Hemoglobin: 11.9 g/dL — ABNORMAL LOW (ref 13.0–17.0)
Hemoglobin: 11.9 g/dL — ABNORMAL LOW (ref 13.0–17.0)
Hemoglobin: 12.2 g/dL — ABNORMAL LOW (ref 13.0–17.0)
Hemoglobin: 12.2 g/dL — ABNORMAL LOW (ref 13.0–17.0)
Hemoglobin: 13.6 g/dL (ref 13.0–17.0)
Hemoglobin: 13.9 g/dL (ref 13.0–17.0)
Hemoglobin: 14.6 g/dL (ref 13.0–17.0)
Hemoglobin: 13.6 g/dL (ref 13.0–17.0)
O2 Saturation: 100 %
O2 Saturation: 100 %
O2 Saturation: 100 %
O2 Saturation: 96 %
O2 Saturation: 99 %
O2 Saturation: 94 %
O2 Saturation: 95 %
O2 Saturation: 93 %
O2 Saturation: 93 %
Patient temperature: 37
Patient temperature: 37
Patient temperature: 35.5
Patient temperature: 36.6
Patient temperature: 36.6
Patient temperature: 36.6
Potassium: 4.1 mmol/L (ref 3.5–5.1)
Potassium: 5.4 mmol/L — ABNORMAL HIGH (ref 3.5–5.1)
Potassium: 5.7 mmol/L — ABNORMAL HIGH (ref 3.5–5.1)
Potassium: 4.9 mmol/L (ref 3.5–5.1)
Potassium: 5.8 mmol/L — ABNORMAL HIGH (ref 3.5–5.1)
Potassium: 4.4 mmol/L (ref 3.5–5.1)
Potassium: 4.3 mmol/L (ref 3.5–5.1)
Potassium: 4.5 mmol/L (ref 3.5–5.1)
Potassium: 4.1 mmol/L (ref 3.5–5.1)
Sodium: 140 mmol/L (ref 135–145)
Sodium: 137 mmol/L (ref 135–145)
Sodium: 139 mmol/L (ref 135–145)
Sodium: 137 mmol/L (ref 135–145)
Sodium: 139 mmol/L (ref 135–145)
Sodium: 141 mmol/L (ref 135–145)
Sodium: 140 mmol/L (ref 135–145)
Sodium: 139 mmol/L (ref 135–145)
Sodium: 139 mmol/L (ref 135–145)
TCO2: 29 mmol/L (ref 22–32)
TCO2: 27 mmol/L (ref 22–32)
TCO2: 27 mmol/L (ref 22–32)
TCO2: 25 mmol/L (ref 22–32)
TCO2: 27 mmol/L (ref 22–32)
TCO2: 26 mmol/L (ref 22–32)
TCO2: 24 mmol/L (ref 22–32)
TCO2: 24 mmol/L (ref 22–32)
TCO2: 22 mmol/L (ref 22–32)
pCO2 arterial: 57.6 mmHg — ABNORMAL HIGH (ref 32.0–48.0)
pCO2 arterial: 45 mmHg (ref 32.0–48.0)
pCO2 arterial: 45.5 mmHg (ref 32.0–48.0)
pCO2 arterial: 44.9 mmHg (ref 32.0–48.0)
pCO2 arterial: 47.8 mmHg (ref 32.0–48.0)
pCO2 arterial: 48.6 mmHg — ABNORMAL HIGH (ref 32.0–48.0)
pCO2 arterial: 42.1 mmHg (ref 32.0–48.0)
pCO2 arterial: 41.1 mmHg (ref 32.0–48.0)
pCO2 arterial: 40.2 mmHg (ref 32.0–48.0)
pH, Arterial: 7.281 — ABNORMAL LOW (ref 7.350–7.450)
pH, Arterial: 7.359 (ref 7.350–7.450)
pH, Arterial: 7.366 (ref 7.350–7.450)
pH, Arterial: 7.309 — ABNORMAL LOW (ref 7.350–7.450)
pH, Arterial: 7.357 (ref 7.350–7.450)
pH, Arterial: 7.31 — ABNORMAL LOW (ref 7.350–7.450)
pH, Arterial: 7.339 — ABNORMAL LOW (ref 7.350–7.450)
pH, Arterial: 7.347 — ABNORMAL LOW (ref 7.350–7.450)
pH, Arterial: 7.325 — ABNORMAL LOW (ref 7.350–7.450)
pO2, Arterial: 223 mmHg — ABNORMAL HIGH (ref 83.0–108.0)
pO2, Arterial: 354 mmHg — ABNORMAL HIGH (ref 83.0–108.0)
pO2, Arterial: 405 mmHg — ABNORMAL HIGH (ref 83.0–108.0)
pO2, Arterial: 168 mmHg — ABNORMAL HIGH (ref 83.0–108.0)
pO2, Arterial: 91 mmHg (ref 83.0–108.0)
pO2, Arterial: 72 mmHg — ABNORMAL LOW (ref 83.0–108.0)
pO2, Arterial: 76 mmHg — ABNORMAL LOW (ref 83.0–108.0)
pO2, Arterial: 69 mmHg — ABNORMAL LOW (ref 83.0–108.0)
pO2, Arterial: 71 mmHg — ABNORMAL LOW (ref 83.0–108.0)

## 2020-07-26 LAB — COMPREHENSIVE METABOLIC PANEL
ALT: 30 U/L (ref 0–44)
AST: 26 U/L (ref 15–41)
Albumin: 3.1 g/dL — ABNORMAL LOW (ref 3.5–5.0)
Alkaline Phosphatase: 43 U/L (ref 38–126)
Anion gap: 6 (ref 5–15)
BUN: 16 mg/dL (ref 6–20)
CO2: 25 mmol/L (ref 22–32)
Calcium: 8.7 mg/dL — ABNORMAL LOW (ref 8.9–10.3)
Chloride: 107 mmol/L (ref 98–111)
Creatinine, Ser: 1.34 mg/dL — ABNORMAL HIGH (ref 0.61–1.24)
GFR, Estimated: >60 mL/min (ref >60)
Glucose, Bld: 107 mg/dL — ABNORMAL HIGH (ref 70–99)
Potassium: 3.8 mmol/L (ref 3.5–5.1)
Sodium: 138 mmol/L (ref 135–145)
Total Bilirubin: 0.8 mg/dL (ref 0.3–1.2)
Total Protein: 5.6 g/dL — ABNORMAL LOW (ref 6.5–8.1)

## 2020-07-26 LAB — APTT: aPTT: 30 seconds (ref 24–36)

## 2020-07-26 LAB — PROTIME-INR
INR: 1.3 — ABNORMAL HIGH (ref 0.8–1.2)
Prothrombin Time: 15.8 seconds — ABNORMAL HIGH (ref 11.4–15.2)

## 2020-07-26 LAB — BASIC METABOLIC PANEL
Anion gap: 5 (ref 5–15)
BUN: 18 mg/dL (ref 6–20)
CO2: 23 mmol/L (ref 22–32)
Calcium: 8 mg/dL — ABNORMAL LOW (ref 8.9–10.3)
Chloride: 107 mmol/L (ref 98–111)
Creatinine, Ser: 1.38 mg/dL — ABNORMAL HIGH (ref 0.61–1.24)
GFR, Estimated: >60 mL/min (ref >60)
Glucose, Bld: 142 mg/dL — ABNORMAL HIGH (ref 70–99)
Potassium: 4.5 mmol/L (ref 3.5–5.1)
Sodium: 135 mmol/L (ref 135–145)

## 2020-07-26 LAB — HEMOGLOBIN AND HEMATOCRIT, BLOOD
HCT: 35.7 % — ABNORMAL LOW (ref 39.0–52.0)
Hemoglobin: 12.2 g/dL — ABNORMAL LOW (ref 13.0–17.0)

## 2020-07-26 LAB — GLUCOSE, CAPILLARY
Glucose-Capillary: 105 mg/dL — ABNORMAL HIGH (ref 70–99)
Glucose-Capillary: 120 mg/dL — ABNORMAL HIGH (ref 70–99)
Glucose-Capillary: 129 mg/dL — ABNORMAL HIGH (ref 70–99)
Glucose-Capillary: 134 mg/dL — ABNORMAL HIGH (ref 70–99)
Glucose-Capillary: 130 mg/dL — ABNORMAL HIGH (ref 70–99)
Glucose-Capillary: 129 mg/dL — ABNORMAL HIGH (ref 70–99)
Glucose-Capillary: 132 mg/dL — ABNORMAL HIGH (ref 70–99)

## 2020-07-26 LAB — ECHO INTRAOPERATIVE TEE
Height: 72 in
Weight: 4490.33 oz

## 2020-07-26 LAB — HEPARIN LEVEL (UNFRACTIONATED): Heparin Unfractionated: 0.38 IU/mL (ref 0.30–0.70)

## 2020-07-26 LAB — HEMOGLOBIN A1C
Hgb A1c MFr Bld: 5.5 % (ref 4.8–5.6)
Mean Plasma Glucose: 111 mg/dL

## 2020-07-26 LAB — PLATELET COUNT: Platelets: 168 10*3/uL (ref 150–400)

## 2020-07-26 LAB — MAGNESIUM: Magnesium: 2.2 mg/dL (ref 1.7–2.4)

## 2020-07-26 SURGERY — CORONARY ARTERY BYPASS GRAFTING (CABG)
Anesthesia: General | Site: Chest

## 2020-07-26 MED ORDER — FENTANYL CITRATE (PF) 250 MCG/5ML IJ SOLN
INTRAMUSCULAR | Status: DC | PRN
  Administered 2020-07-26: 100 ug via INTRAVENOUS
  Administered 2020-07-26: 50 ug via INTRAVENOUS
  Administered 2020-07-26 (×2): 150 ug via INTRAVENOUS
  Administered 2020-07-26 (×2): 100 ug via INTRAVENOUS
  Administered 2020-07-26: 50 ug via INTRAVENOUS
  Administered 2020-07-26: 100 ug via INTRAVENOUS
  Administered 2020-07-26: 50 ug via INTRAVENOUS
  Administered 2020-07-26 (×2): 100 ug via INTRAVENOUS
  Administered 2020-07-26: 50 ug via INTRAVENOUS
  Administered 2020-07-26 (×2): 100 ug via INTRAVENOUS
  Administered 2020-07-26: 50 ug via INTRAVENOUS

## 2020-07-26 MED ORDER — ALBUMIN HUMAN 5 % IV SOLN
250.0000 mL | INTRAVENOUS | Status: AC | PRN
  Administered 2020-07-26: 12.5 g via INTRAVENOUS

## 2020-07-26 MED ORDER — PHENYLEPHRINE 40 MCG/ML (10ML) SYRINGE FOR IV PUSH (FOR BLOOD PRESSURE SUPPORT)
PREFILLED_SYRINGE | INTRAVENOUS | Status: AC
  Filled 2020-07-26: qty 10

## 2020-07-26 MED ORDER — BUPIVACAINE HCL (PF) 0.5 % IJ SOLN
INTRAMUSCULAR | Status: AC
  Filled 2020-07-26: qty 30

## 2020-07-26 MED ORDER — PANTOPRAZOLE SODIUM 40 MG PO TBEC
40.0000 mg | DELAYED_RELEASE_TABLET | Freq: Every day | ORAL | Status: DC
  Administered 2020-07-28 – 2020-07-30 (×3): 40 mg via ORAL
  Filled 2020-07-26 (×3): qty 1

## 2020-07-26 MED ORDER — METOPROLOL TARTRATE 5 MG/5ML IV SOLN: 2.5000 mg | INTRAVENOUS | Status: DC | PRN

## 2020-07-26 MED ORDER — VANCOMYCIN HCL 1000 MG IV SOLR
INTRAVENOUS | Status: DC | PRN
  Administered 2020-07-26 (×3): 1000 mg

## 2020-07-26 MED ORDER — ACETAMINOPHEN 650 MG RE SUPP
650.0000 mg | Freq: Once | RECTAL | Status: AC
Start: 2020-07-26 — End: 2020-07-26
  Administered 2020-07-26: 650 mg via RECTAL

## 2020-07-26 MED ORDER — PROPOFOL 10 MG/ML IV BOLUS
INTRAVENOUS | Status: AC
  Filled 2020-07-26: qty 20

## 2020-07-26 MED ORDER — COLCHICINE 0.3 MG HALF TABLET
0.3000 mg | ORAL_TABLET | Freq: Two times a day (BID) | ORAL | Status: DC
  Administered 2020-07-26: 0.3 mg via ORAL
  Filled 2020-07-26 (×2): qty 1

## 2020-07-26 MED ORDER — KETOROLAC TROMETHAMINE 15 MG/ML IJ SOLN
7.5000 mg | Freq: Four times a day (QID) | INTRAMUSCULAR | Status: DC
  Administered 2020-07-26 – 2020-07-27 (×3): 7.5 mg via INTRAVENOUS
  Filled 2020-07-26 (×3): qty 1

## 2020-07-26 MED ORDER — CEFAZOLIN SODIUM-DEXTROSE 2-4 GM/100ML-% IV SOLN
2.0000 g | Freq: Three times a day (TID) | INTRAVENOUS | Status: AC
  Administered 2020-07-26 – 2020-07-28 (×6): 2 g via INTRAVENOUS
  Filled 2020-07-26 (×7): qty 100

## 2020-07-26 MED ORDER — MIDAZOLAM HCL 2 MG/2ML IJ SOLN
INTRAMUSCULAR | Status: AC
  Filled 2020-07-26: qty 2

## 2020-07-26 MED ORDER — HEPARIN SODIUM (PORCINE) 1000 UNIT/ML IJ SOLN
INTRAMUSCULAR | Status: DC | PRN
  Administered 2020-07-26: 10000 [IU] via INTRAVENOUS
  Administered 2020-07-26: 34000 [IU] via INTRAVENOUS
  Administered 2020-07-26: 10000 [IU] via INTRAVENOUS

## 2020-07-26 MED ORDER — BUPIVACAINE LIPOSOME 1.3 % IJ SUSP
INTRAMUSCULAR | Status: DC | PRN
  Administered 2020-07-26: 50 mL

## 2020-07-26 MED ORDER — ROCURONIUM BROMIDE 10 MG/ML (PF) SYRINGE
PREFILLED_SYRINGE | INTRAVENOUS | Status: DC | PRN
  Administered 2020-07-26 (×3): 50 mg via INTRAVENOUS
  Administered 2020-07-26: 60 mg via INTRAVENOUS
  Administered 2020-07-26: 40 mg via INTRAVENOUS

## 2020-07-26 MED ORDER — LACTATED RINGERS IV SOLN: INTRAVENOUS | Status: DC | PRN

## 2020-07-26 MED ORDER — ASPIRIN 81 MG PO CHEW: 324.0000 mg | CHEWABLE_TABLET | Freq: Every day | ORAL | Status: DC

## 2020-07-26 MED ORDER — SODIUM CHLORIDE (PF) 0.9 % IJ SOLN
OROMUCOSAL | Status: DC | PRN
  Administered 2020-07-26 (×2): 4 mL via TOPICAL

## 2020-07-26 MED ORDER — STERILE WATER FOR INJECTION IJ SOLN
INTRAMUSCULAR | Status: AC
  Filled 2020-07-26: qty 10

## 2020-07-26 MED ORDER — METOPROLOL TARTRATE 25 MG/10 ML ORAL SUSPENSION
12.5000 mg | Freq: Two times a day (BID) | ORAL | Status: DC
  Filled 2020-07-26 (×2): qty 5

## 2020-07-26 MED ORDER — MIDAZOLAM HCL (PF) 10 MG/2ML IJ SOLN
INTRAMUSCULAR | Status: AC
  Filled 2020-07-26: qty 2

## 2020-07-26 MED ORDER — DEXMEDETOMIDINE HCL IN NACL 400 MCG/100ML IV SOLN: 0.0000 ug/kg/h | INTRAVENOUS | Status: DC

## 2020-07-26 MED ORDER — STERILE WATER FOR INJECTION IJ SOLN
INTRAMUSCULAR | Status: DC | PRN
  Administered 2020-07-26: 10 mL via INTRAMUSCULAR

## 2020-07-26 MED ORDER — MIDAZOLAM HCL 2 MG/2ML IJ SOLN: 2.0000 mg | INTRAMUSCULAR | Status: DC | PRN

## 2020-07-26 MED ORDER — NITROGLYCERIN IN D5W 200-5 MCG/ML-% IV SOLN
7.0000 ug/min | INTRAVENOUS | Status: DC
Start: 2020-07-26 — End: 2020-07-27

## 2020-07-26 MED ORDER — BUPIVACAINE LIPOSOME 1.3 % IJ SUSP
INTRAMUSCULAR | Status: AC
  Filled 2020-07-26: qty 20

## 2020-07-26 MED ORDER — LIDOCAINE 5 % EX PTCH
2.0000 | MEDICATED_PATCH | CUTANEOUS | Status: DC
  Administered 2020-07-26 – 2020-07-29 (×4): 2 via TRANSDERMAL
  Filled 2020-07-26 (×5): qty 2

## 2020-07-26 MED ORDER — ONDANSETRON HCL 4 MG/2ML IJ SOLN: 4.0000 mg | Freq: Four times a day (QID) | INTRAMUSCULAR | Status: DC | PRN

## 2020-07-26 MED ORDER — MILRINONE LACTATE IN DEXTROSE 20-5 MG/100ML-% IV SOLN
0.3000 ug/kg/min | INTRAVENOUS | Status: DC
  Filled 2020-07-26 (×2): qty 100

## 2020-07-26 MED ORDER — PLASMA-LYTE A IV SOLN: INTRAVENOUS | Status: DC

## 2020-07-26 MED ORDER — MAGNESIUM SULFATE 4 GM/100ML IV SOLN
4.0000 g | Freq: Once | INTRAVENOUS | Status: AC
  Administered 2020-07-26: 4 g via INTRAVENOUS
  Filled 2020-07-26: qty 100

## 2020-07-26 MED ORDER — SODIUM CHLORIDE 0.9 % IV SOLN: INTRAVENOUS | Status: DC | PRN

## 2020-07-26 MED ORDER — DOCUSATE SODIUM 100 MG PO CAPS
200.0000 mg | ORAL_CAPSULE | Freq: Every day | ORAL | Status: DC
  Administered 2020-07-27 – 2020-07-28 (×2): 200 mg via ORAL
  Filled 2020-07-26 (×3): qty 2

## 2020-07-26 MED ORDER — SODIUM CHLORIDE 0.9% FLUSH
3.0000 mL | Freq: Two times a day (BID) | INTRAVENOUS | Status: DC
  Administered 2020-07-27 – 2020-07-30 (×4): 3 mL via INTRAVENOUS

## 2020-07-26 MED ORDER — VANCOMYCIN HCL IN DEXTROSE 1-5 GM/200ML-% IV SOLN
1000.0000 mg | Freq: Once | INTRAVENOUS | Status: AC
  Administered 2020-07-26: 1000 mg via INTRAVENOUS
  Filled 2020-07-26: qty 200

## 2020-07-26 MED ORDER — PHENYLEPHRINE HCL (PRESSORS) 10 MG/ML IV SOLN
INTRAVENOUS | Status: AC
  Filled 2020-07-26: qty 1

## 2020-07-26 MED ORDER — THROMBIN 5000 UNITS EX SOLR
INTRAVENOUS | Status: DC | PRN
  Administered 2020-07-26: 2 mL

## 2020-07-26 MED ORDER — DIAZEPAM 2 MG PO TABS
2.0000 mg | ORAL_TABLET | Freq: Three times a day (TID) | ORAL | Status: AC
  Administered 2020-07-26 – 2020-07-28 (×6): 2 mg via ORAL
  Filled 2020-07-26 (×6): qty 1

## 2020-07-26 MED ORDER — LACTATED RINGERS IV SOLN: 500.0000 mL | Freq: Once | INTRAVENOUS | Status: DC | PRN

## 2020-07-26 MED ORDER — VANCOMYCIN HCL 1000 MG IV SOLR
INTRAVENOUS | Status: AC
  Filled 2020-07-26: qty 3000

## 2020-07-26 MED ORDER — 0.9 % SODIUM CHLORIDE (POUR BTL) OPTIME
TOPICAL | Status: DC | PRN
  Administered 2020-07-26: 5000 mL

## 2020-07-26 MED ORDER — ACETAMINOPHEN 160 MG/5ML PO SOLN: 1000.0000 mg | Freq: Four times a day (QID) | ORAL | Status: DC

## 2020-07-26 MED ORDER — PROTAMINE SULFATE 10 MG/ML IV SOLN
INTRAVENOUS | Status: AC
  Filled 2020-07-26: qty 25

## 2020-07-26 MED ORDER — TRANEXAMIC ACID-NACL 1000-0.7 MG/100ML-% IV SOLN
1000.0000 mg | INTRAVENOUS | Status: DC
  Filled 2020-07-26: qty 100

## 2020-07-26 MED ORDER — ACETAMINOPHEN 160 MG/5ML PO SOLN
650.0000 mg | Freq: Once | ORAL | Status: AC
Start: 2020-07-26 — End: 2020-07-26

## 2020-07-26 MED ORDER — FENTANYL CITRATE (PF) 250 MCG/5ML IJ SOLN
INTRAMUSCULAR | Status: AC
  Filled 2020-07-26: qty 5

## 2020-07-26 MED ORDER — PROTAMINE SULFATE 10 MG/ML IV SOLN
INTRAVENOUS | Status: AC
  Filled 2020-07-26: qty 15

## 2020-07-26 MED ORDER — MUPIROCIN 2 % EX OINT
1.0000 "application " | TOPICAL_OINTMENT | Freq: Two times a day (BID) | CUTANEOUS | Status: DC
  Administered 2020-07-26 – 2020-07-30 (×8): 1 via NASAL
  Filled 2020-07-26 (×3): qty 22

## 2020-07-26 MED ORDER — LIDOCAINE HCL (PF) 2 % IJ SOLN
INTRAMUSCULAR | Status: AC
  Filled 2020-07-26: qty 5

## 2020-07-26 MED ORDER — SODIUM CHLORIDE 0.9 % IV SOLN
250.0000 mL | INTRAVENOUS | Status: DC
  Administered 2020-07-27: 250 mL via INTRAVENOUS

## 2020-07-26 MED ORDER — INSULIN REGULAR(HUMAN) IN NACL 100-0.9 UT/100ML-% IV SOLN: INTRAVENOUS | Status: DC

## 2020-07-26 MED ORDER — ASPIRIN EC 325 MG PO TBEC
325.0000 mg | DELAYED_RELEASE_TABLET | Freq: Every day | ORAL | Status: DC
  Administered 2020-07-27 – 2020-07-29 (×3): 325 mg via ORAL
  Filled 2020-07-26 (×3): qty 1

## 2020-07-26 MED ORDER — PROPOFOL 10 MG/ML IV BOLUS
INTRAVENOUS | Status: DC | PRN
  Administered 2020-07-26: 120 mg via INTRAVENOUS

## 2020-07-26 MED ORDER — POTASSIUM CHLORIDE 10 MEQ/50ML IV SOLN: 10.0000 meq | INTRAVENOUS | Status: AC

## 2020-07-26 MED ORDER — PROTAMINE SULFATE 10 MG/ML IV SOLN
INTRAVENOUS | Status: DC | PRN
  Administered 2020-07-26: 20 mg via INTRAVENOUS
  Administered 2020-07-26: 280 mg via INTRAVENOUS

## 2020-07-26 MED ORDER — HEPARIN SODIUM (PORCINE) 1000 UNIT/ML IJ SOLN
INTRAMUSCULAR | Status: AC
  Filled 2020-07-26: qty 1

## 2020-07-26 MED ORDER — TRAMADOL HCL 50 MG PO TABS
50.0000 mg | ORAL_TABLET | ORAL | Status: DC | PRN
  Administered 2020-07-26 – 2020-07-29 (×4): 100 mg via ORAL
  Filled 2020-07-26 (×4): qty 2

## 2020-07-26 MED ORDER — HEPARIN SODIUM (PORCINE) 1000 UNIT/ML IJ SOLN
INTRAMUSCULAR | Status: AC
  Filled 2020-07-26: qty 3

## 2020-07-26 MED ORDER — BISACODYL 10 MG RE SUPP: 10.0000 mg | Freq: Every day | RECTAL | Status: DC

## 2020-07-26 MED ORDER — DEXTROSE 50 % IV SOLN: 0.0000 mL | INTRAVENOUS | Status: DC | PRN

## 2020-07-26 MED ORDER — CHLORHEXIDINE GLUCONATE 0.12 % MT SOLN
15.0000 mL | OROMUCOSAL | Status: AC
  Administered 2020-07-26: 15 mL via OROMUCOSAL

## 2020-07-26 MED ORDER — CHLORHEXIDINE GLUCONATE CLOTH 2 % EX PADS
6.0000 | MEDICATED_PAD | Freq: Every day | CUTANEOUS | Status: DC
  Administered 2020-07-26 – 2020-07-30 (×5): 6 via TOPICAL

## 2020-07-26 MED ORDER — LACTATED RINGERS IV SOLN: INTRAVENOUS | Status: DC

## 2020-07-26 MED ORDER — OXYCODONE HCL 5 MG PO TABS
5.0000 mg | ORAL_TABLET | ORAL | Status: DC | PRN
  Administered 2020-07-27 – 2020-07-28 (×5): 10 mg via ORAL
  Filled 2020-07-26 (×6): qty 2

## 2020-07-26 MED ORDER — MIDAZOLAM HCL 5 MG/5ML IJ SOLN
INTRAMUSCULAR | Status: DC | PRN
  Administered 2020-07-26 (×4): 2 mg via INTRAVENOUS
  Administered 2020-07-26: 4 mg via INTRAVENOUS

## 2020-07-26 MED ORDER — ROCURONIUM BROMIDE 10 MG/ML (PF) SYRINGE
PREFILLED_SYRINGE | INTRAVENOUS | Status: AC
  Filled 2020-07-26: qty 10

## 2020-07-26 MED ORDER — ROCURONIUM BROMIDE 10 MG/ML (PF) SYRINGE
PREFILLED_SYRINGE | INTRAVENOUS | Status: AC
  Filled 2020-07-26: qty 30

## 2020-07-26 MED ORDER — PLASMA-LYTE A IV SOLN
INTRAVENOUS | Status: DC | PRN
  Administered 2020-07-26: 1000 mL via INTRAVASCULAR

## 2020-07-26 MED ORDER — ACETAMINOPHEN 500 MG PO TABS
1000.0000 mg | ORAL_TABLET | Freq: Four times a day (QID) | ORAL | Status: DC
  Administered 2020-07-27 – 2020-07-30 (×11): 1000 mg via ORAL
  Filled 2020-07-26 (×11): qty 2

## 2020-07-26 MED ORDER — ALBUMIN HUMAN 5 % IV SOLN: INTRAVENOUS | Status: DC | PRN

## 2020-07-26 MED ORDER — FAMOTIDINE IN NACL 20-0.9 MG/50ML-% IV SOLN
20.0000 mg | Freq: Two times a day (BID) | INTRAVENOUS | Status: AC
  Administered 2020-07-26: 20 mg via INTRAVENOUS
  Filled 2020-07-26 (×2): qty 50

## 2020-07-26 MED ORDER — SODIUM CHLORIDE 0.9% FLUSH: 3.0000 mL | INTRAVENOUS | Status: DC | PRN

## 2020-07-26 MED ORDER — CHLORHEXIDINE GLUCONATE CLOTH 2 % EX PADS: 6.0000 | MEDICATED_PAD | Freq: Every day | CUTANEOUS | Status: DC

## 2020-07-26 MED ORDER — PLATELET RICH PLASMA OPTIME
Status: DC | PRN
  Administered 2020-07-26: 10 mL

## 2020-07-26 MED ORDER — FENTANYL CITRATE (PF) 250 MCG/5ML IJ SOLN
INTRAMUSCULAR | Status: AC
  Filled 2020-07-26: qty 25

## 2020-07-26 MED ORDER — BISACODYL 5 MG PO TBEC
10.0000 mg | DELAYED_RELEASE_TABLET | Freq: Every day | ORAL | Status: DC
  Administered 2020-07-27 – 2020-07-28 (×2): 10 mg via ORAL
  Filled 2020-07-26 (×3): qty 2

## 2020-07-26 MED ORDER — SODIUM CHLORIDE 0.45 % IV SOLN: INTRAVENOUS | Status: DC | PRN

## 2020-07-26 MED ORDER — SODIUM CHLORIDE 0.9 % IV SOLN: INTRAVENOUS | Status: DC

## 2020-07-26 MED ORDER — PLATELET POOR PLASMA OPTIME
Status: DC | PRN
  Administered 2020-07-26: 10 mL

## 2020-07-26 MED ORDER — HEMOSTATIC AGENTS (NO CHARGE) OPTIME
TOPICAL | Status: DC | PRN
  Administered 2020-07-26 (×2): 1 via TOPICAL

## 2020-07-26 MED ORDER — MORPHINE SULFATE (PF) 2 MG/ML IV SOLN
1.0000 mg | INTRAVENOUS | Status: DC | PRN
  Administered 2020-07-26: 4 mg via INTRAVENOUS
  Administered 2020-07-26: 2 mg via INTRAVENOUS
  Administered 2020-07-27 – 2020-07-28 (×5): 4 mg via INTRAVENOUS
  Filled 2020-07-26 (×4): qty 2
  Filled 2020-07-26: qty 1
  Filled 2020-07-26 (×4): qty 2

## 2020-07-26 MED ORDER — PHENYLEPHRINE HCL-NACL 20-0.9 MG/250ML-% IV SOLN: 0.0000 ug/min | INTRAVENOUS | Status: DC

## 2020-07-26 MED ORDER — METOPROLOL TARTRATE 12.5 MG HALF TABLET
12.5000 mg | ORAL_TABLET | Freq: Two times a day (BID) | ORAL | Status: DC
  Administered 2020-07-27 – 2020-07-28 (×4): 12.5 mg via ORAL
  Filled 2020-07-26 (×6): qty 1

## 2020-07-26 SURGICAL SUPPLY — 107 items
ADAPTER CARDIO PERF ANTE/RETRO (ADAPTER) ×5 IMPLANT
APPLICATOR TIP COSEAL (VASCULAR PRODUCTS) ×10 IMPLANT
APPLIER CLIP 9.375 SM OPEN (CLIP) ×5
BAG DECANTER FOR FLEXI CONT (MISCELLANEOUS) ×5 IMPLANT
BATTERY MAXDRIVER (MISCELLANEOUS) ×5 IMPLANT
BLADE STERNUM SYSTEM 6 (BLADE) ×5 IMPLANT
BLADE SURG 15 STRL LF DISP TIS (BLADE) ×4 IMPLANT
BLADE SURG 15 STRL SS (BLADE) ×3
BNDG ELASTIC 4X5.8 VLCR STR LF (GAUZE/BANDAGES/DRESSINGS) ×5 IMPLANT
BNDG ELASTIC 6X5.8 VLCR STR LF (GAUZE/BANDAGES/DRESSINGS) ×5 IMPLANT
BNDG GAUZE ELAST 4 BULKY (GAUZE/BANDAGES/DRESSINGS) ×5 IMPLANT
CABLE PACING FASLOC BLUE (MISCELLANEOUS) ×5 IMPLANT
CANISTER SUCT 3000ML PPV (MISCELLANEOUS) ×5 IMPLANT
CANNULA NON VENT 22FR 12 (CANNULA) ×5 IMPLANT
CATH CPB KIT HENDRICKSON (MISCELLANEOUS) ×5 IMPLANT
CATH ROBINSON RED A/P 18FR (CATHETERS) ×10 IMPLANT
CLIP APPLIE 9.375 SM OPEN (CLIP) ×4 IMPLANT
CLIP RETRACTION 3.0MM CORONARY (MISCELLANEOUS) ×5 IMPLANT
CLIP VESOCCLUDE MED 24/CT (CLIP) IMPLANT
CLIP VESOCCLUDE SM WIDE 24/CT (CLIP) ×15 IMPLANT
CONTAINER PROTECT SURGISLUSH (MISCELLANEOUS) ×10 IMPLANT
COVER MAYO STAND STRL (DRAPES) ×5 IMPLANT
DERMABOND ADVANCED (GAUZE/BANDAGES/DRESSINGS) ×1
DERMABOND ADVANCED .7 DNX12 (GAUZE/BANDAGES/DRESSINGS) ×4 IMPLANT
DRAIN CHANNEL 28F RND 3/8 FF (WOUND CARE) ×15 IMPLANT
DRAPE CARDIOVASCULAR INCISE (DRAPES) ×2
DRAPE EXTREMITY T 121X128X90 (DISPOSABLE) ×5 IMPLANT
DRAPE HALF SHEET 40X57 (DRAPES) ×5 IMPLANT
DRAPE SRG 135X102X78XABS (DRAPES) ×4 IMPLANT
DRAPE WARM FLUID 44X44 (DRAPES) ×5 IMPLANT
DRSG AQUACEL AG ADV 3.5X10 (GAUZE/BANDAGES/DRESSINGS) ×5 IMPLANT
DRSG AQUACEL AG ADV 3.5X14 (GAUZE/BANDAGES/DRESSINGS) ×5 IMPLANT
DRSG COVADERM 4X14 (GAUZE/BANDAGES/DRESSINGS) IMPLANT
ELECT CAUTERY BLADE 6.4 (BLADE) ×5 IMPLANT
ELECT REM PT RETURN 9FT ADLT (ELECTROSURGICAL) ×10
ELECTRODE REM PT RTRN 9FT ADLT (ELECTROSURGICAL) ×8 IMPLANT
FELT TEFLON 1X6 (MISCELLANEOUS) ×10 IMPLANT
GAUZE SPONGE 4X4 12PLY STRL (GAUZE/BANDAGES/DRESSINGS) ×10 IMPLANT
GLOVE NEODERM STRL 7.5  LF PF (GLOVE) ×12
GLOVE NEODERM STRL 7.5 LF PF (GLOVE) ×12 IMPLANT
GLOVE SURG MICRO LTX SZ6 (GLOVE) ×5 IMPLANT
GLOVE SURG MICRO LTX SZ6.5 (GLOVE) ×15 IMPLANT
GLOVE SURG NEODERM 7.5  LF PF (GLOVE) ×3
GLOVE SURG PROTEXIS BL SZ6.5 (GLOVE) ×5 IMPLANT
GLOVE SURG UNDER POLY LF SZ6.5 (GLOVE) ×25 IMPLANT
GOWN STRL REUS W/ TWL LRG LVL3 (GOWN DISPOSABLE) ×32 IMPLANT
GOWN STRL REUS W/TWL LRG LVL3 (GOWN DISPOSABLE) ×16
HEMOSTAT POWDER SURGIFOAM 1G (HEMOSTASIS) ×20 IMPLANT
INSERT FOGARTY XLG (MISCELLANEOUS) ×5 IMPLANT
INSERT SUTURE HOLDER (MISCELLANEOUS) ×5 IMPLANT
KIT APPLICATOR RATIO 11:1 (KITS) ×5 IMPLANT
KIT BASIN OR (CUSTOM PROCEDURE TRAY) ×5 IMPLANT
KIT SUCTION CATH 14FR (SUCTIONS) ×5 IMPLANT
KIT TURNOVER KIT B (KITS) ×5 IMPLANT
MARKER GRAFT CORONARY BYPASS (MISCELLANEOUS) ×15 IMPLANT
NEEDLE 18GX1X1/2 (RX/OR ONLY) (NEEDLE) ×5 IMPLANT
NS IRRIG 1000ML POUR BTL (IV SOLUTION) ×25 IMPLANT
PACK E OPEN HEART (SUTURE) ×5 IMPLANT
PACK OPEN HEART (CUSTOM PROCEDURE TRAY) ×5 IMPLANT
PACK PLATELET PROCEDURE 60 (MISCELLANEOUS) ×5 IMPLANT
PACK SPY-PHI (KITS) ×5 IMPLANT
PAD ARMBOARD 7.5X6 YLW CONV (MISCELLANEOUS) ×10 IMPLANT
PAD ELECT DEFIB RADIOL ZOLL (MISCELLANEOUS) ×5 IMPLANT
PENCIL BUTTON HOLSTER BLD 10FT (ELECTRODE) ×5 IMPLANT
POSITIONER HEAD DONUT 9IN (MISCELLANEOUS) ×5 IMPLANT
POWDER SURGICEL 3.0 GRAM (HEMOSTASIS) ×5 IMPLANT
PUNCH AORTIC ROTATE 4.5MM 8IN (MISCELLANEOUS) ×5 IMPLANT
SEALANT SURG COSEAL 8ML (VASCULAR PRODUCTS) ×5 IMPLANT
SET MPS 3-ND DEL (MISCELLANEOUS) ×5 IMPLANT
SHEARS HARMONIC 9CM CVD (BLADE) ×5 IMPLANT
SUT BONE WAX W31G (SUTURE) ×5 IMPLANT
SUT MNCRL AB 3-0 PS2 18 (SUTURE) ×15 IMPLANT
SUT PDS AB 1 CTX 36 (SUTURE) ×10 IMPLANT
SUT PROLENE 3 0 SH 48 (SUTURE) ×10 IMPLANT
SUT PROLENE 3 0 SH DA (SUTURE) ×5 IMPLANT
SUT PROLENE 4 0 SH DA (SUTURE) ×5 IMPLANT
SUT PROLENE 5 0 C 1 36 (SUTURE) IMPLANT
SUT PROLENE 6 0 C 1 30 (SUTURE) ×15 IMPLANT
SUT PROLENE 7 0 BV 1 (SUTURE) ×5 IMPLANT
SUT PROLENE 8 0 BV175 6 (SUTURE) ×5 IMPLANT
SUT PROLENE BLUE 7 0 (SUTURE) ×5 IMPLANT
SUT SILK  1 MH (SUTURE) ×2
SUT SILK 1 MH (SUTURE) ×4 IMPLANT
SUT SILK 2 0 SH CR/8 (SUTURE) IMPLANT
SUT SILK 3 0 SH CR/8 (SUTURE) IMPLANT
SUT STEEL 6MS V (SUTURE) ×5 IMPLANT
SUT STEEL STERNAL CCS#1 18IN (SUTURE) ×5 IMPLANT
SUT STEEL SZ 6 DBL 3X14 BALL (SUTURE) ×10 IMPLANT
SUT VIC AB 2-0 CT1 27 (SUTURE) ×5
SUT VIC AB 2-0 CT1 TAPERPNT 27 (SUTURE) ×4 IMPLANT
SUT VIC AB 2-0 CTX 27 (SUTURE) IMPLANT
SUT VIC AB 3-0 SH 27 (SUTURE)
SUT VIC AB 3-0 SH 27X BRD (SUTURE) IMPLANT
SUT VIC AB 3-0 X1 27 (SUTURE) IMPLANT
SYR 10ML LL (SYRINGE) IMPLANT
SYR 30ML LL (SYRINGE) ×5 IMPLANT
SYR 3ML LL SCALE MARK (SYRINGE) ×5 IMPLANT
SYR 50ML SLIP (SYRINGE) IMPLANT
SYSTEM SAHARA CHEST DRAIN ATS (WOUND CARE) ×5 IMPLANT
TAPE CLOTH SURG 4X10 WHT LF (GAUZE/BANDAGES/DRESSINGS) ×5 IMPLANT
TAPE PAPER 2X10 WHT MICROPORE (GAUZE/BANDAGES/DRESSINGS) ×5 IMPLANT
TIP DUAL SPRAY TOPICAL (TIP) ×5 IMPLANT
TOWEL GREEN STERILE (TOWEL DISPOSABLE) ×5 IMPLANT
TOWEL GREEN STERILE FF (TOWEL DISPOSABLE) ×5 IMPLANT
TRAY FOLEY SLVR 16FR TEMP STAT (SET/KITS/TRAYS/PACK) ×5 IMPLANT
UNDERPAD 30X36 HEAVY ABSORB (UNDERPADS AND DIAPERS) ×5 IMPLANT
WATER STERILE IRR 1000ML POUR (IV SOLUTION) ×10 IMPLANT

## 2020-07-26 NOTE — Brief Op Note (Signed)
07/21/2020 - 07/26/2020  12:34 PM  PATIENT:  Roberto Morrison  50 y.o. male  PRE-OPERATIVE DIAGNOSIS:  Coronary artery disease  POST-OPERATIVE DIAGNOSIS:  Coronary artery disease  PROCEDURES:    -CORONARY ARTERY BYPASS GRAFTING (CABG) X THREE,   USING LEFT INTERNAL MAMMARY ARTERY, RIGHT INTERNAL MAMMARY ARTERY AND LEFT RADIAL ARTERY CONDUITS.   LIMA->LAD RIMA->PDA LRA->OM  -TRANSESOPHAGEAL ECHOCARDIOGRAM  -INDOCYANINE GREEN FLUORESCENCE IMAGING  -RADIAL ARTERY HARVEST (Left) -INTERCOSTAL NERVE BLOCK -APPLICATION OF CELL SAVER  SURGEON:  Wonda Olds, MD - Primary  PHYSICIAN ASSISTANT: Alayshia Marini  ASSISTANTS: Joslyn Devon, RNFA   ANESTHESIA:   general  EBL:  per perfusion and anesthesia records   BLOOD ADMINISTERED:none  DRAINS:  mediastinal and bilateral pleural drains    LOCAL MEDICATIONS USED:  bilateral intercostal Exparel  SPECIMEN:  No Specimen  DISPOSITION OF SPECIMEN:  N/A  COUNTS: correct  DICTATION: .Dragon Dictation  PLAN OF CARE: Admit to inpatient   PATIENT DISPOSITION:  ICU - intubated and hemodynamically stable.   Delay start of Pharmacological VTE agent (>24hrs) due to surgical blood loss or risk of bleeding: yes

## 2020-07-26 NOTE — Anesthesia Procedure Notes (Signed)
Procedure Name: Intubation Date/Time: 07/26/2020 8:04 AM Performed by: Clearnce Sorrel, CRNA Pre-anesthesia Checklist: Patient identified, Emergency Drugs available, Suction available, Patient being monitored and Timeout performed Patient Re-evaluated:Patient Re-evaluated prior to induction Oxygen Delivery Method: Circle system utilized Preoxygenation: Pre-oxygenation with 100% oxygen Induction Type: IV induction Ventilation: Mask ventilation without difficulty and Oral airway inserted - appropriate to patient size Laryngoscope Size: Glidescope and 4 (expected difficult airway) Grade View: Grade I Tube type: Oral Tube size: 8.0 mm Number of attempts: 1 Airway Equipment and Method: Stylet Placement Confirmation: positive ETCO2 and breath sounds checked- equal and bilateral Secured at: 24.5 cm Tube secured with: Tape Dental Injury: Teeth and Oropharynx as per pre-operative assessment

## 2020-07-26 NOTE — Procedures (Deleted)
Extubation Procedure Note  Patient Details:   Name: Roberto Morrison DOB: 09-18-1970 MRN: [id number]   Airway Documentation:    Vent end date: (not recorded) Vent end time: (not recorded)   Evaluation  O2 sats: stable throughout Complications: No apparent complications Patient did tolerate procedure well. Bilateral Breath Sounds: Clear   Yes  Pt extubated to 6L [place]. Positive cuff leak, NIF 20, VC within normal limits. Pt is tolerating well.  [name] 07/26/2020, 6:01 PM

## 2020-07-26 NOTE — H&P (Signed)
History and Physical Interval Note:  07/26/2020 7:25 AM  Roberto Morrison  has presented today for surgery, with the diagnosis of CAD.  The various methods of treatment have been discussed with the patient and family. After consideration of risks, benefits and other options for treatment, the patient has consented to  Procedure(s): CORONARY ARTERY BYPASS GRAFTING (CABG) (N/A) TRANSESOPHAGEAL ECHOCARDIOGRAM (TEE) (N/A) INDOCYANINE GREEN FLUORESCENCE IMAGING (ICG) (N/A) RADIAL ARTERY HARVEST (Left) as a surgical intervention.  The patient's history has been reviewed, patient examined, no change in status, stable for surgery.  I have reviewed the patient's chart and labs.  Questions were answered to the patient's satisfaction.     Wonda Olds

## 2020-07-26 NOTE — Op Note (Signed)
CARDIOTHORACIC SURGERY OPERATIVE NOTE  Date of Procedure: 07/26/2020  Preoperative Diagnosis: Severe 3-vessel Coronary Artery Disease  Postoperative Diagnosis: Same  Procedure:   Coronary Artery Bypass Grafting x 3  Left Internal Mammary Artery to Distal Left Anterior Descending Coronary Artery; pedicled right internal mammary artery to Posterior Descending Coronary Artery; left radial artery graft to obtuse Marginal Branch of Left Circumflex Coronary Artery Open left radial artery harvest Bilateral internal mammary artery harvesting Completion graft surveillance with indocyanine green fluorescence imaging  Surgeon: B.  Murvin Natal, MD  Assistant: Macarthur Critchley, PA-C  Anesthesia: General  Operative Findings: Preserved left ventricular systolic function Good quality internal mammary artery conduits Good quality radial artery conduit Good quality target vessels for grafting    BRIEF CLINICAL NOTE AND INDICATIONS FOR SURGERY  50 year old man without significant past medical history presented with chest pain while working out.  He was demonstrated to have an NSTEMI.  Work-up showed severe three-vessel coronary artery disease.  He was referred for CABG.  He was thoroughly evaluated and is considered an excellent candidate for the procedure   DETAILS OF THE OPERATIVE PROCEDURE  Preparation:  The patient is brought to the operating room on the above mentioned date and central monitoring was established by the anesthesia team including placement of Swan-Ganz catheter and radial arterial line. The patient is placed in the supine position on the operating table.  Intravenous antibiotics are administered. General endotracheal anesthesia is induced uneventfully. A Foley catheter is placed.  Baseline transesophageal echocardiogram was performed.  Findings were notable for mildly reduced LV function and no significant valvular disease  The patient's chest, abdomen, both groins, the left  upper extremity, and both lower extremities are prepared and draped in a sterile manner. A time out procedure is performed.   Surgical Approach and Conduit Harvest:  Attention is first turned to the left forearm where an incision was made near the wrist overlying a palpable pulse.  Open radial harvesting was performed with the assistance of a harmonic scalpel.  Once the artery is removed, it is placed in a solution of papaverine and the wound is closed in layers.  The left arm is read tucked at the side.  A median sternotomy incision was performed and the left internal mammary artery is dissected from the chest wall and prepared for bypass grafting. The left internal mammary artery is notably good quality conduit.  Next, attention was turned to the right hemithorax where the right internal mammary artery and its pedicle are also mobilized.  Following systemic heparinization, the internal mammary artery conduits were transected distally noted to have excellent flow.   Extracorporeal Cardiopulmonary Bypass and Myocardial Protection:  The pericardium is opened. The ascending aorta is nondiseased in appearance. The ascending aorta and the right atrium are cannulated for cardiopulmonary bypass.  Adequate heparinization is verified.     The entire pre-bypass portion of the operation was notable for stable hemodynamics.  Cardiopulmonary bypass was begun and the surface of the heart is inspected. Distal target vessels are selected for coronary artery bypass grafting. A cardioplegia cannula is placed in the ascending aorta.    The patient is allowed to cool passively to 34C systemic temperature.  The aortic cross clamp is applied and cold blood cardioplegia is delivered initially in an antegrade fashion through the aortic root.   The initial cardioplegic arrest is rapid with early diastolic arrest.  Repeat doses of cardioplegia are administered intermittently throughout the entire cross clamp portion of the  operation  through the aortic root and through subsequently placed  grafts in order to maintain completely flat electrocardiogram.   Coronary Artery Bypass Grafting:  The  posterior descending branch of the right coronary artery was grafted using the pedicled right internal mammary artery graft in an end-to-side fashion.  At the site of distal anastomosis the target vessel was good quality and measured approximately 1.5 mm in diameter. Anastomotic patency and runoff was confirmed with indocyanine green fluorescence imaging (SPY).  The  obtuse marginal branch of the left circumflex coronary artery was grafted using the left radial artery graft in an end-to-side fashion.  At the site of distal anastomosis the target vessel was good quality and measured approximately 1.5 mm in diameter. The distal left anterior coronary artery was grafted with the left internal mammary artery in an end-to-side fashion.  At the site of distal anastomosis the target vessel was good quality and measured approximately 1.5 mm in diameter. Anastomotic patency and runoff was confirmed with indocyanine green fluorescence imaging (SPY).   The proximal radial artery graft anastomosis was placed directly to the ascending aorta prior to removal of the aortic cross clamp.  De-airing procedures were performed and the aortic cross-clamp was removed.   Procedure Completion:  All proximal and distal coronary anastomoses were inspected for hemostasis and appropriate graft orientation. Epicardial pacing wires are fixed to the right ventricular outflow tract and to the right atrial appendage. The patient is rewarmed to 37C temperature. The patient is weaned and disconnected from cardiopulmonary bypass.  The patient's rhythm at separation from bypass was normal sinus.  The patient was weaned from cardiopulmonary bypass with moderate inotropic support.   Followup transesophageal echocardiogram performed after separation from bypass revealed   no changes from the preoperative exam.  The aortic and venous cannula were removed uneventfully. Protamine was administered to reverse the anticoagulation. The mediastinum and pleural space were inspected for hemostasis and irrigated with saline solution. The mediastinum and both pleural spaces were drained using fluted chest tubes placed through separate stab incisions inferiorly.  The soft tissues anterior to the aorta were reapproximated loosely. The sternum is closed with double strength sternal wire. The soft tissues anterior to the sternum were closed in multiple layers and the skin is closed with a running subcuticular skin closure.  The post-bypass portion of the operation was notable for stable rhythm and hemodynamics.  No blood products were administered during the operation.   Disposition:  The patient tolerated the procedure well and is transported to the surgical intensive care in stable condition. There are no intraoperative complications. All sponge instrument and needle counts are verified correct at completion of the operation.    Jayme Cloud, MD 07/26/2020 4:33 PM

## 2020-07-26 NOTE — Anesthesia Procedure Notes (Signed)
Central Venous Catheter Insertion Performed by: Roberts Gaudy, MD, anesthesiologist Start/End6/21/2022 7:00 AM, 07/26/2020 7:10 AM Patient location: Pre-op. Preanesthetic checklist: patient identified, IV checked, site marked, risks and benefits discussed, surgical consent, monitors and equipment checked, pre-op evaluation, timeout performed and anesthesia consent Position: supine Hand hygiene performed  and maximum sterile barriers used  PA cath was placed.Swan type:thermodilution Procedure performed without using ultrasound guided technique. Attempts: 1 Following insertion, line sutured. Patient tolerated the procedure well with no immediate complications.

## 2020-07-26 NOTE — Procedures (Signed)
Extubation Procedure Note  Patient Details:   Name: Roberto Morrison DOB: [birth year] MRN: [id number]   Airway Documentation:    Vent end date: 07/26/20 Vent end time: 1750   Evaluation  O2 sats: stable throughout Complications: No apparent complications Patient did tolerate procedure well. Bilateral Breath Sounds: Clear   Yes  Pt extubated to 6L [place]. Positive cuff leak, NIF 20, VC within normal limits. Pt is tolerating well.  [name] 07/26/2020, 6:10 PM

## 2020-07-26 NOTE — Progress Notes (Signed)
  Echocardiogram Echocardiogram Transesophageal has been performed.  Roberto Morrison M 07/26/2020, 8:41 AM

## 2020-07-26 NOTE — Anesthesia Postprocedure Evaluation (Signed)
Anesthesia Post Note  Patient: Roberto Morrison  Procedure(s) Performed: CORONARY ARTERY BYPASS GRAFTING (CABG) X THREE, USING LEFT INTERNAL MAMMARY ARTERY< RIGHT INTERNAL MAMMARY ARTERY AND LEFT RADIAL ARTERY CONDUITS. (Chest) TRANSESOPHAGEAL ECHOCARDIOGRAM (TEE) INDOCYANINE GREEN FLUORESCENCE IMAGING (ICG) RADIAL ARTERY HARVEST (Left: Arm Lower) INTERCOSTAL NERVE BLOCK (Chest) APPLICATION OF CELL SAVER (Chest)     Patient location during evaluation: SICU Anesthesia Type: General Level of consciousness: sedated and patient remains intubated per anesthesia plan Pain management: pain level controlled Vital Signs Assessment: post-procedure vital signs reviewed and stable Respiratory status: patient remains intubated per anesthesia plan and patient on ventilator - see flowsheet for VS Cardiovascular status: stable Postop Assessment: no apparent nausea or vomiting Anesthetic complications: no   No notable events documented.  Last Vitals:  Vitals:   07/26/20 1445 07/26/20 1500  BP:  110/73  Pulse: 89 89  Resp: 16 16  Temp: (!) 35.5 C (!) 35.6 C  SpO2: 95% 93%    Last Pain:  Vitals:   07/26/20 1500  TempSrc: Core (Comment)  PainSc:                  Davit Vassar COKER

## 2020-07-26 NOTE — Progress Notes (Signed)
EVENING ROUNDS NOTE :     [address]       [place] [address]             [phone]                 Day of Surgery Procedure(s) (LRB): CORONARY ARTERY BYPASS GRAFTING (CABG) X THREE, USING LEFT INTERNAL MAMMARY ARTERY< RIGHT INTERNAL MAMMARY ARTERY AND LEFT RADIAL ARTERY CONDUITS. (N/A) TRANSESOPHAGEAL ECHOCARDIOGRAM (TEE) (N/A) INDOCYANINE GREEN FLUORESCENCE IMAGING (ICG) (N/A) RADIAL ARTERY HARVEST (Left) INTERCOSTAL NERVE BLOCK APPLICATION OF CELL SAVER   Total Length of Stay:  LOS: 5 days  Events:   Extubated Low CT output Good uop     BP 125/62   Pulse 85   Temp (!) 97.5 F (36.4 C)   Resp 20   Ht 6' (1.829 m)   Wt 127.3 kg   SpO2 96%   BMI 38.06 kg/m   PAP: (24-32)/(4-23) 28/19 CO:  [6.4 L/min-6.9 L/min] 6.9 L/min CI:  [2.6 L/min/m2-2.8 L/min/m2] 2.8 L/min/m2  Vent Mode: PSV;CPAP FiO2 (%):  [40 %-50 %] 40 % Set Rate:  [4 bmp-12 bmp] 4 bmp Vt Set:  [867 mL] 620 mL PEEP:  [5 cmH20] 5 cmH20 Pressure Support:  [10 cmH20] 10 cmH20 Plateau Pressure:  [22 cmH20-27 cmH20] 27 cmH20   sodium chloride 20 mL/hr at 07/26/20 1700   [START ON 07/27/2020] sodium chloride     sodium chloride 10 mL/hr at 07/26/20 1500   albumin human 12.5 g (07/26/20 1430)    ceFAZolin (ANCEF) IV     dexmedetomidine (PRECEDEX) IV infusion 0.2 mcg/kg/hr (07/26/20 1700)   famotidine (PEPCID) IV Stopped (07/26/20 1643)   insulin 1.2 Units/hr (07/26/20 1700)   lactated ringers     lactated ringers 10 mL/hr at 07/26/20 1700   lactated ringers 10 mL/hr at 07/26/20 1700   magnesium sulfate 20 mL/hr at 07/26/20 1700   milrinone 0.3 mcg/kg/min (07/26/20 1700)   nitroGLYCERIN 10 mcg/min (07/26/20 1700)   phenylephrine (NEO-SYNEPHRINE) Adult infusion     vancomycin      I/O last 3 completed shifts: In: 313.4 [I.V.:313.4] Out: -    CBC Latest Ref Rng & Units 07/26/2020 07/26/2020 07/26/2020  WBC 4.0 - 10.5 K/uL - 11.3(H) -  Hemoglobin 13.0 - 17.0 g/dL 13.6 13.3  12.2(L)  Hematocrit 39.0 - 52.0 % 40.0 39.7 36.0(L)  Platelets 150 - 400 K/uL - 128(L) -    BMP Latest Ref Rng & Units 07/26/2020 07/26/2020 07/26/2020  Glucose 70 - 99 mg/dL - - 138(H)  BUN 6 - 20 mg/dL - - 15  Creatinine 0.61 - 1.24 mg/dL - - 1.30(H)  Sodium 135 - 145 mmol/L 141 139 137  Potassium 3.5 - 5.1 mmol/L 4.4 4.9 5.8(H)  Chloride 98 - 111 mmol/L - - 104  CO2 22 - 32 mmol/L - - -  Calcium 8.9 - 10.3 mg/dL - - -    ABG    Component Value Date/Time   PHART 7.310 (L) 07/26/2020 1436   PCO2ART 48.6 (H) 07/26/2020 1436   PO2ART 72 (L) 07/26/2020 1436   HCO3 24.8 07/26/2020 1436   TCO2 26 07/26/2020 1436   ACIDBASEDEF 2.0 07/26/2020 1436   O2SAT 94.0 07/26/2020 [place], MD 07/26/2020 6:01 PM

## 2020-07-26 NOTE — Anesthesia Procedure Notes (Signed)
Central Venous Catheter Insertion Performed by: Roberts Gaudy, MD, anesthesiologist Start/End6/21/2022 7:00 AM, 07/26/2020 7:10 AM Patient location: Pre-op. Preanesthetic checklist: patient identified, IV checked, site marked, risks and benefits discussed, surgical consent, monitors and equipment checked, pre-op evaluation, timeout performed and anesthesia consent Position: supine Hand hygiene performed  and maximum sterile barriers used  Catheter size: 9 Fr PA cath was placed.Swan type:thermodilution Procedure performed without using ultrasound guided technique. Ultrasound Notes:anatomy identified, needle tip was noted to be adjacent to the nerve/plexus identified and no ultrasound evidence of intravascular and/or intraneural injection Attempts: 1 Following insertion, line sutured, dressing applied and Biopatch. Post procedure assessment: blood return through all ports and free fluid flow  Patient tolerated the procedure well with no immediate complications.

## 2020-07-26 NOTE — Anesthesia Preprocedure Evaluation (Signed)
Anesthesia Evaluation  Patient identified by MRN, date of birth, ID band Patient awake    Reviewed: Allergy & Precautions, NPO status , Patient's Chart, lab work & pertinent test results  Airway Mallampati: III  TM Distance: >3 FB Neck ROM: Full    Dental  (+) Teeth Intact, Dental Advisory Given   Pulmonary    breath sounds clear to auscultation       Cardiovascular  Rhythm:Regular Rate:Normal     Neuro/Psych    GI/Hepatic   Endo/Other    Renal/GU      Musculoskeletal   Abdominal   Peds  Hematology   Anesthesia Other Findings   Reproductive/Obstetrics                             Anesthesia Physical Anesthesia Plan  ASA: 3  Anesthesia Plan: General   Post-op Pain Management:    Induction: Intravenous  PONV Risk Score and Plan: Ondansetron and Dexamethasone  Airway Management Planned: Oral ETT  Additional Equipment: Arterial line, PA Cath, 3D TEE and Ultrasound Guidance Line Placement  Intra-op Plan:   Post-operative Plan: Post-operative intubation/ventilation  Informed Consent: I have reviewed the patients History and Physical, chart, labs and discussed the procedure including the risks, benefits and alternatives for the proposed anesthesia with the patient or authorized representative who has indicated his/her understanding and acceptance.     Dental advisory given  Plan Discussed with: CRNA and Anesthesiologist  Anesthesia Plan Comments:         Anesthesia Quick Evaluation

## 2020-07-26 NOTE — Transfer of Care (Signed)
Immediate Anesthesia Transfer of Care Note  Patient: Roberto Morrison  Procedure(s) Performed: CORONARY ARTERY BYPASS GRAFTING (CABG) X THREE, USING LEFT INTERNAL MAMMARY ARTERY< RIGHT INTERNAL MAMMARY ARTERY AND LEFT RADIAL ARTERY CONDUITS. (Chest) TRANSESOPHAGEAL ECHOCARDIOGRAM (TEE) INDOCYANINE GREEN FLUORESCENCE IMAGING (ICG) RADIAL ARTERY HARVEST (Left: Arm Lower) INTERCOSTAL NERVE BLOCK (Chest) APPLICATION OF CELL SAVER (Chest)  Patient Location: SICU  Anesthesia Type:General  Level of Consciousness: Patient remains intubated per anesthesia plan  Airway & Oxygen Therapy: Patient remains intubated per anesthesia plan  Post-op Assessment: Report given to RN and Post -op Vital signs reviewed and stable  Post vital signs: Reviewed and stable  Last Vitals:  Vitals Value Taken Time  BP 112/75 07/26/20 1425  Temp 35.4 C 07/26/20 1432  Pulse 89 07/26/20 1432  Resp 15 07/26/20 1432  SpO2 95 % 07/26/20 1432  Vitals shown include unvalidated device data.  Last Pain:  Vitals:   07/26/20 0508  TempSrc: Oral  PainSc:       Patients Stated Pain Goal: 0 (46/19/01 2224)  Complications: No notable events documented.

## 2020-07-26 NOTE — Anesthesia Procedure Notes (Signed)
Arterial Line Insertion Start/End6/21/2022 7:10 AM, 07/26/2020 7:12 AM Performed by: Clearnce Sorrel, CRNA  Patient location: Pre-op. Preanesthetic checklist: patient identified, IV checked, site marked, risks and benefits discussed, surgical consent, monitors and equipment checked, pre-op evaluation, timeout performed and anesthesia consent Lidocaine 1% used for infiltration Right, radial was placed Catheter size: 20 Fr Hand hygiene performed  and maximum sterile barriers used   Attempts: 1 Procedure performed without using ultrasound guided technique. Following insertion, dressing applied. Post procedure assessment: normal and unchanged

## 2020-07-27 ENCOUNTER — Inpatient Hospital Stay (HOSPITAL_COMMUNITY): Payer: 59

## 2020-07-27 ENCOUNTER — Encounter (HOSPITAL_COMMUNITY): Payer: Self-pay | Admitting: Cardiothoracic Surgery

## 2020-07-27 LAB — CBC
HCT: 39.1 % (ref 39.0–52.0)
HCT: 39.9 % (ref 39.0–52.0)
Hemoglobin: 13.6 g/dL (ref 13.0–17.0)
Hemoglobin: 13.1 g/dL (ref 13.0–17.0)
MCH: 32.8 pg (ref 26.0–34.0)
MCH: 31.8 pg (ref 26.0–34.0)
MCHC: 34.8 g/dL (ref 30.0–36.0)
MCHC: 32.8 g/dL (ref 30.0–36.0)
MCV: 94.2 fL (ref 80.0–100.0)
MCV: 96.8 fL (ref 80.0–100.0)
Platelets: 137 10*3/uL — ABNORMAL LOW (ref 150–400)
Platelets: 156 10*3/uL (ref 150–400)
RBC: 4.15 MIL/uL — ABNORMAL LOW (ref 4.22–5.81)
RBC: 4.12 MIL/uL — ABNORMAL LOW (ref 4.22–5.81)
RDW: 14.1 % (ref 11.5–15.5)
RDW: 14.4 % (ref 11.5–15.5)
WBC: 12.4 10*3/uL — ABNORMAL HIGH (ref 4.0–10.5)
WBC: 13.2 10*3/uL — ABNORMAL HIGH (ref 4.0–10.5)
nRBC: 0 % (ref 0.0–0.2)
nRBC: 0 % (ref 0.0–0.2)

## 2020-07-27 LAB — GLUCOSE, CAPILLARY
Glucose-Capillary: 109 mg/dL — ABNORMAL HIGH (ref 70–99)
Glucose-Capillary: 113 mg/dL — ABNORMAL HIGH (ref 70–99)
Glucose-Capillary: 117 mg/dL — ABNORMAL HIGH (ref 70–99)
Glucose-Capillary: 118 mg/dL — ABNORMAL HIGH (ref 70–99)
Glucose-Capillary: 122 mg/dL — ABNORMAL HIGH (ref 70–99)
Glucose-Capillary: 125 mg/dL — ABNORMAL HIGH (ref 70–99)
Glucose-Capillary: 131 mg/dL — ABNORMAL HIGH (ref 70–99)
Glucose-Capillary: 131 mg/dL — ABNORMAL HIGH (ref 70–99)
Glucose-Capillary: 138 mg/dL — ABNORMAL HIGH (ref 70–99)

## 2020-07-27 LAB — BASIC METABOLIC PANEL
Anion gap: 5 (ref 5–15)
Anion gap: 5 (ref 5–15)
BUN: 19 mg/dL (ref 6–20)
BUN: 22 mg/dL — ABNORMAL HIGH (ref 6–20)
CO2: 23 mmol/L (ref 22–32)
CO2: 26 mmol/L (ref 22–32)
Calcium: 7.9 mg/dL — ABNORMAL LOW (ref 8.9–10.3)
Calcium: 8.4 mg/dL — ABNORMAL LOW (ref 8.9–10.3)
Chloride: 107 mmol/L (ref 98–111)
Chloride: 102 mmol/L (ref 98–111)
Creatinine, Ser: 1.29 mg/dL — ABNORMAL HIGH (ref 0.61–1.24)
Creatinine, Ser: 1.61 mg/dL — ABNORMAL HIGH (ref 0.61–1.24)
GFR, Estimated: >60 mL/min (ref >60)
GFR, Estimated: 52 mL/min — ABNORMAL LOW (ref >60)
Glucose, Bld: 138 mg/dL — ABNORMAL HIGH (ref 70–99)
Glucose, Bld: 135 mg/dL — ABNORMAL HIGH (ref 70–99)
Potassium: 4.2 mmol/L (ref 3.5–5.1)
Potassium: 4.1 mmol/L (ref 3.5–5.1)
Sodium: 135 mmol/L (ref 135–145)
Sodium: 133 mmol/L — ABNORMAL LOW (ref 135–145)

## 2020-07-27 LAB — COOXEMETRY PANEL
Carboxyhemoglobin: 1.3 % (ref 0.5–1.5)
Carboxyhemoglobin: 1.3 % (ref 0.5–1.5)
Carboxyhemoglobin: 1.2 % (ref 0.5–1.5)
Methemoglobin: 1.1 % (ref 0.0–1.5)
Methemoglobin: 1.1 % (ref 0.0–1.5)
Methemoglobin: 1.1 % (ref 0.0–1.5)
O2 Saturation: 99.3 %
O2 Saturation: 60.8 %
O2 Saturation: 51.7 %
Total hemoglobin: 12 g/dL (ref 12.0–16.0)
Total hemoglobin: 13.3 g/dL (ref 12.0–16.0)
Total hemoglobin: 14.1 g/dL (ref 12.0–16.0)

## 2020-07-27 LAB — MAGNESIUM
Magnesium: 2.1 mg/dL (ref 1.7–2.4)
Magnesium: 2.2 mg/dL (ref 1.7–2.4)

## 2020-07-27 LAB — HEMOGLOBIN A1C
Hgb A1c MFr Bld: 5.5 % (ref 4.8–5.6)
Mean Plasma Glucose: 111 mg/dL

## 2020-07-27 MED ORDER — ISOSORBIDE DINITRATE 10 MG PO TABS
10.0000 mg | ORAL_TABLET | Freq: Three times a day (TID) | ORAL | Status: DC
  Administered 2020-07-27 – 2020-07-30 (×10): 10 mg via ORAL
  Filled 2020-07-27 (×10): qty 1

## 2020-07-27 MED ORDER — INSULIN ASPART 100 UNIT/ML IJ SOLN
0.0000 [IU] | Freq: Three times a day (TID) | INTRAMUSCULAR | Status: DC
  Administered 2020-07-27 – 2020-07-28 (×3): 2 [IU] via SUBCUTANEOUS

## 2020-07-27 MED ORDER — THIAMINE HCL 100 MG/ML IJ SOLN
Freq: Once | INTRAVENOUS | Status: AC
  Filled 2020-07-27: qty 1000

## 2020-07-27 MED ORDER — INSULIN ASPART 100 UNIT/ML IJ SOLN
0.0000 [IU] | INTRAMUSCULAR | Status: DC
  Administered 2020-07-27: 2 [IU] via SUBCUTANEOUS

## 2020-07-27 MED ORDER — SODIUM CHLORIDE 0.9% FLUSH: 10.0000 mL | Freq: Two times a day (BID) | INTRAVENOUS | Status: DC

## 2020-07-27 MED ORDER — SODIUM CHLORIDE 0.9% FLUSH: 10.0000 mL | INTRAVENOUS | Status: DC | PRN

## 2020-07-27 MED ORDER — MILRINONE LACTATE IN DEXTROSE 20-5 MG/100ML-% IV SOLN
0.1250 ug/kg/min | INTRAVENOUS | Status: DC
  Administered 2020-07-27: 0.125 ug/kg/min via INTRAVENOUS
  Filled 2020-07-27 (×2): qty 100

## 2020-07-27 MED ORDER — COLCHICINE 0.6 MG PO TABS
0.6000 mg | ORAL_TABLET | Freq: Two times a day (BID) | ORAL | Status: DC
  Administered 2020-07-27 – 2020-07-30 (×7): 0.6 mg via ORAL
  Filled 2020-07-27 (×7): qty 1

## 2020-07-27 MED ORDER — KETOROLAC TROMETHAMINE 15 MG/ML IJ SOLN
15.0000 mg | Freq: Four times a day (QID) | INTRAMUSCULAR | Status: AC
  Administered 2020-07-27 (×2): 15 mg via INTRAVENOUS
  Filled 2020-07-27 (×2): qty 1

## 2020-07-27 NOTE — Progress Notes (Signed)
      [address]       [place] [address]             [phone]    POD # 1 CABG  BP 134/79   Pulse 83   Temp 98.8 F (37.1 C) (Oral)   Resp (!) 29   Ht 6' (1.829 m)   Wt 131.2 kg   SpO2 95%   BMI 39.24 kg/m  2L [place]  Milrinone @ 0.125  Intake/Output Summary (Last 24 hours) at 07/27/2020 1802 Last data filed at 07/27/2020 1600 Gross per 24 hour  Intake 3606.29 ml  Output 1930 ml  Net 1676.29 ml   PM labs pending  Remo Lipps C. Roxan Hockey, MD [facility] [phone]

## 2020-07-27 NOTE — Evaluation (Signed)
Physical Therapy Evaluation Patient Details Name: Roberto Morrison MRN: [id number] DOB: August 03, 1970 Today's Date: 07/27/2020   History of Present Illness  50 yo admitted 6/13 with chest pain while working out. Pt with NSTEMI s/p cath. 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 No significant PMhx  Clinical Impression  Pt pleasant and motivated to do any activity that will help him get stronger and home. Pt educated for all sternal precautions, transfers, gait and progression. Pt struggling to take deep breaths on 2L at rest and 4L for gait with SpO2 92-95%. Pt educated for IS use with pt achieving 750cc. Pt with decreased transfers, gait and functional mobility who will benefit from acute therapy to maximize mobility, safety and function to decrease burden of care.   HR 78-92 BP post gait 128/72      Follow Up Recommendations Home health PT    Equipment Recommendations  Rolling walker with 5" wheels;3in1 (PT)    Recommendations for Other Services       Precautions / Restrictions Precautions Precautions: Sternal;Fall Precaution Booklet Issued: No Precaution Comments: pt with heart sx book and discussed precautions      Mobility  Bed Mobility               General bed mobility comments: in chair on arrival and end of session    Transfers Overall transfer level: Needs assistance   Transfers: Sit to/from Stand Sit to Stand: Min guard         General transfer comment: cues for hand placement with assist to manage lines  Ambulation/Gait Ambulation/Gait assistance: Min guard Gait Distance (Feet): 120 Feet Assistive device: Rolling walker (2 wheeled) Gait Pattern/deviations: Step-through pattern;Decreased stride length   Gait velocity interpretation: <1.31 ft/sec, indicative of household ambulator General Gait Details: slow cautious steps with cues for position in RW and cues for breathing technique. Pt with short shallow breaths requiring 4L for gait with SPo2  92%  Stairs            Wheelchair Mobility    Modified Rankin (Stroke Patients Only)       Balance Overall balance assessment: Needs assistance   Sitting balance-Leahy Scale: Good     Standing balance support: Bilateral upper extremity supported Standing balance-Leahy Scale: Poor Standing balance comment: bil UE supported on RW for gait                             Pertinent Vitals/Pain Pain Assessment: 0-10 Pain Score: 7  Pain Location: incision particularly with deep breaths Pain Descriptors / Indicators: Aching;Guarding Pain Intervention(s): Limited activity within patient's tolerance;Monitored during session;Repositioned    Home Living Family/patient expects to be discharged to:: Private residence Living Arrangements: Alone Available Help at Discharge: Family;Available 24 hours/day Type of Home: House Home Access: Level entry     Home Layout: Two level Home Equipment: None      Prior Function Level of Independence: Independent         Comments: enjoys working out and owns fitness [employer]        Extremity/Trunk Assessment   Upper Extremity Assessment Upper Extremity Assessment: Overall WFL for tasks assessed    Lower Extremity Assessment Lower Extremity Assessment: Overall WFL for tasks assessed    Cervical / Trunk Assessment Cervical / Trunk Assessment: Normal  Communication   Communication: No difficulties  Cognition Arousal/Alertness: Awake/alert Behavior During Therapy: WFL for tasks  assessed/performed Overall Cognitive Status: Within Functional Limits for tasks assessed                                        General Comments      Exercises     Assessment/Plan    PT Assessment Patient needs continued PT services  PT Problem List Decreased mobility;Decreased activity tolerance;Decreased knowledge of use of DME;Cardiopulmonary status limiting activity;Decreased  knowledge of precautions       PT Treatment Interventions Gait training;Stair training;Functional mobility training;Therapeutic activities;Patient/family education;DME instruction    PT Goals (Current goals can be found in the Care Plan section)  Acute Rehab PT Goals Patient Stated Goal: return home, back to working out PT Goal Formulation: With patient/family Time For Goal Achievement: 08/10/20 Potential to Achieve Goals: Good    Frequency Min 3X/week   Barriers to discharge        Co-evaluation               AM-PAC PT "6 Clicks" Mobility  Outcome Measure Help needed turning from your back to your side while in a flat bed without using bedrails?: A Little Help needed moving from lying on your back to sitting on the side of a flat bed without using bedrails?: A Little Help needed moving to and from a bed to a chair (including a wheelchair)?: A Little Help needed standing up from a chair using your arms (e.g., wheelchair or bedside chair)?: A Little Help needed to walk in hospital room?: A Little Help needed climbing 3-5 steps with a railing? : A Lot 6 Click Score: 17    End of Session Equipment Utilized During Treatment: Oxygen Activity Tolerance: Patient tolerated treatment well Patient left: in chair;with call bell/phone within reach;with family/visitor present Nurse Communication: Mobility status;Precautions PT Visit Diagnosis: Other abnormalities of gait and mobility (R26.89);Difficulty in walking, not elsewhere classified (R26.2)    Time: 1700-1749 PT Time Calculation (min) (ACUTE ONLY): 43 min   Charges:   PT Evaluation $PT Eval Moderate Complexity: 1 Mod PT Treatments $Gait Training: 8-22 mins $Therapeutic Activity: 8-22 mins        Maddelynn Moosman P, PT Acute Rehabilitation Services Pager: [phone] Office: [place] 07/27/2020, 1:40 PM

## 2020-07-27 NOTE — Plan of Care (Signed)
  Problem: Education: Goal: Knowledge of General Education information will improve Description: Including pain rating scale, medication(s)/side effects and non-pharmacologic comfort measures Outcome: Progressing   Problem: Health Behavior/Discharge Planning: Goal: Ability to manage health-related needs will improve Outcome: Progressing   Problem: Clinical Measurements: Goal: Ability to maintain clinical measurements within normal limits will improve Outcome: Progressing Goal: Will remain free from infection Outcome: Progressing Goal: Diagnostic test results will improve Outcome: Progressing Goal: Respiratory complications will improve Outcome: Progressing Goal: Cardiovascular complication will be avoided Outcome: Progressing   Problem: Activity: Goal: Risk for activity intolerance will decrease Outcome: Progressing   Problem: Nutrition: Goal: Adequate nutrition will be maintained Outcome: Progressing   Problem: Coping: Goal: Level of anxiety will decrease Outcome: Progressing   Problem: Elimination: Goal: Will not experience complications related to bowel motility Outcome: Progressing Goal: Will not experience complications related to urinary retention Outcome: Progressing   Problem: Pain Managment: Goal: General experience of comfort will improve Outcome: Progressing   Problem: Safety: Goal: Ability to remain free from injury will improve Outcome: Progressing   Problem: Skin Integrity: Goal: Risk for impaired skin integrity will decrease Outcome: Progressing   Problem: Education: Goal: Understanding of CV disease, CV risk reduction, and recovery process will improve Outcome: Progressing Goal: Individualized Educational Video(s) Outcome: Progressing   Problem: Activity: Goal: Ability to return to baseline activity level will improve Outcome: Progressing   Problem: Health Behavior/Discharge Planning: Goal: Ability to safely manage health-related needs  after discharge will improve Outcome: Progressing   Problem: Education: Goal: Will demonstrate proper wound care and an understanding of methods to prevent future damage Outcome: Progressing Goal: Knowledge of disease or condition will improve Outcome: Progressing Goal: Knowledge of the prescribed therapeutic regimen will improve Outcome: Progressing Goal: Individualized Educational Video(s) Outcome: Progressing   Problem: Activity: Goal: Risk for activity intolerance will decrease Outcome: Progressing   Problem: Cardiac: Goal: Will achieve and/or maintain hemodynamic stability Outcome: Progressing   Problem: Clinical Measurements: Goal: Postoperative complications will be avoided or minimized Outcome: Progressing   Problem: Respiratory: Goal: Respiratory status will improve Outcome: Progressing   Problem: Skin Integrity: Goal: Wound healing without signs and symptoms of infection Outcome: Progressing Goal: Risk for impaired skin integrity will decrease Outcome: Progressing   Problem: Urinary Elimination: Goal: Ability to achieve and maintain adequate renal perfusion and functioning will improve Outcome: Progressing

## 2020-07-27 NOTE — Progress Notes (Signed)
1 Day Post-Op Procedure(s) (LRB): CORONARY ARTERY BYPASS GRAFTING (CABG) X THREE, USING LEFT INTERNAL MAMMARY ARTERY< RIGHT INTERNAL MAMMARY ARTERY AND LEFT RADIAL ARTERY CONDUITS. (N/A) TRANSESOPHAGEAL ECHOCARDIOGRAM (TEE) (N/A) INDOCYANINE GREEN FLUORESCENCE IMAGING (ICG) (N/A) RADIAL ARTERY HARVEST (Left) INTERCOSTAL NERVE BLOCK APPLICATION OF CELL SAVER Subjective: No complaints  Objective: Vital signs in last 24 hours: Temp:  [95.72 F (35.4 C)-97.7 F (36.5 C)] 97.6 F (36.4 C) (06/22 0000) Pulse Rate:  [80-90] 89 (06/22 0700) Cardiac Rhythm: Atrial paced (06/21 2000) Resp:  [10-36] 35 (06/22 0700) BP: (83-142)/(54-73) 127/71 (06/22 0700) SpO2:  [93 %-98 %] 95 % (06/22 0700) Arterial Line BP: (90-173)/(43-74) 142/62 (06/22 0700) FiO2 (%):  [40 %-50 %] 40 % (06/21 1705) Weight:  [131.2 kg] 131.2 kg (06/22 0500)  Hemodynamic parameters for last 24 hours: PAP: (23-44)/(4-24) 25/16 CO:  [4.8 L/min-6.9 L/min] 4.8 L/min CI:  [1.9 L/min/m2-3.3 L/min/m2] 3.3 L/min/m2  Intake/Output from previous day: 06/21 0701 - 06/22 0700 In: 6023.3 [I.V.:4478.2; Blood:800; IV Piggyback:745.2] Out: 4695 [Urine:2295; Blood:200; Chest Tube:750] Intake/Output this shift: No intake/output data recorded.  General appearance: alert and cooperative Neurologic: intact Heart: regular rate and rhythm, S1, S2 normal, no murmur, click, rub or gallop Lungs: clear to auscultation bilaterally Abdomen: soft, non-tender; bowel sounds normal; no masses,  no organomegaly Extremities: extremities normal, atraumatic, no cyanosis or edema Wound: c/d/i  Lab Results: Recent Labs    07/26/20 2252 07/27/20 0525  WBC 11.9* 12.4*  HGB 13.3 13.6  HCT 38.7* 39.1  PLT 133* 137*   BMET:  Recent Labs    07/26/20 2252 07/27/20 0525  NA 135 135  K 4.5 4.2  CL 107 107  CO2 23 23  GLUCOSE 142* 138*  BUN 18 19  CREATININE 1.38* 1.29*  CALCIUM 8.0* 7.9*    PT/INR:  Recent Labs    07/26/20 1412   LABPROT 15.8*  INR 1.3*   ABG    Component Value Date/Time   PHART 7.325 (L) 07/26/2020 1848   HCO3 21.0 07/26/2020 1848   TCO2 22 07/26/2020 1848   ACIDBASEDEF 5.0 (H) 07/26/2020 1848   O2SAT 60.8 07/27/2020 0634   CBG (last 3)  Recent Labs    07/26/20 2256 07/27/20 0107 07/27/20 0314  GLUCAP 132* 138* 113*    Assessment/Plan: S/P Procedure(s) (LRB): CORONARY ARTERY BYPASS GRAFTING (CABG) X THREE, USING LEFT INTERNAL MAMMARY ARTERY< RIGHT INTERNAL MAMMARY ARTERY AND LEFT RADIAL ARTERY CONDUITS. (N/A) TRANSESOPHAGEAL ECHOCARDIOGRAM (TEE) (N/A) INDOCYANINE GREEN FLUORESCENCE IMAGING (ICG) (N/A) RADIAL ARTERY HARVEST (Left) INTERCOSTAL NERVE BLOCK APPLICATION OF CELL SAVER Mobilize D/c art line Pt/ot Gentle hydration Pain control Keep chest tubes Milrinone to 0.125 today   LOS: 6 days    Wonda Olds 07/27/2020

## 2020-07-28 ENCOUNTER — Inpatient Hospital Stay (HOSPITAL_COMMUNITY): Payer: 59

## 2020-07-28 LAB — COOXEMETRY PANEL
Carboxyhemoglobin: 1.3 % (ref 0.5–1.5)
Methemoglobin: 1.1 % (ref 0.0–1.5)
O2 Saturation: 63.4 %
Total hemoglobin: 12.6 g/dL (ref 12.0–16.0)

## 2020-07-28 LAB — CBC
HCT: 36.4 % — ABNORMAL LOW (ref 39.0–52.0)
Hemoglobin: 12.4 g/dL — ABNORMAL LOW (ref 13.0–17.0)
MCH: 32.5 pg (ref 26.0–34.0)
MCHC: 34.1 g/dL (ref 30.0–36.0)
MCV: 95.5 fL (ref 80.0–100.0)
Platelets: 125 10*3/uL — ABNORMAL LOW (ref 150–400)
RBC: 3.81 MIL/uL — ABNORMAL LOW (ref 4.22–5.81)
RDW: 14.5 % (ref 11.5–15.5)
WBC: 10 10*3/uL (ref 4.0–10.5)
nRBC: 0 % (ref 0.0–0.2)

## 2020-07-28 LAB — BASIC METABOLIC PANEL
Anion gap: 4 — ABNORMAL LOW (ref 5–15)
BUN: 19 mg/dL (ref 6–20)
CO2: 28 mmol/L (ref 22–32)
Calcium: 8 mg/dL — ABNORMAL LOW (ref 8.9–10.3)
Chloride: 104 mmol/L (ref 98–111)
Creatinine, Ser: 1.4 mg/dL — ABNORMAL HIGH (ref 0.61–1.24)
GFR, Estimated: >60 mL/min (ref >60)
Glucose, Bld: 120 mg/dL — ABNORMAL HIGH (ref 70–99)
Potassium: 4 mmol/L (ref 3.5–5.1)
Sodium: 136 mmol/L (ref 135–145)

## 2020-07-28 LAB — GLUCOSE, CAPILLARY
Glucose-Capillary: 127 mg/dL — ABNORMAL HIGH (ref 70–99)
Glucose-Capillary: 153 mg/dL — ABNORMAL HIGH (ref 70–99)
Glucose-Capillary: 106 mg/dL — ABNORMAL HIGH (ref 70–99)
Glucose-Capillary: 121 mg/dL — ABNORMAL HIGH (ref 70–99)
Glucose-Capillary: 106 mg/dL — ABNORMAL HIGH (ref 70–99)

## 2020-07-28 MED ORDER — FUROSEMIDE 10 MG/ML IJ SOLN
80.0000 mg | Freq: Two times a day (BID) | INTRAMUSCULAR | Status: DC
  Administered 2020-07-28 (×2): 80 mg via INTRAVENOUS
  Filled 2020-07-28 (×3): qty 8

## 2020-07-28 MED ORDER — POTASSIUM CHLORIDE CRYS ER 20 MEQ PO TBCR
20.0000 meq | EXTENDED_RELEASE_TABLET | Freq: Two times a day (BID) | ORAL | Status: AC
  Administered 2020-07-28 – 2020-07-30 (×5): 20 meq via ORAL
  Filled 2020-07-28 (×5): qty 1

## 2020-07-28 MED FILL — Sodium Chloride IV Soln 0.9%: INTRAVENOUS | Qty: 2000 | Status: AC

## 2020-07-28 MED FILL — Magnesium Sulfate Inj 50%: INTRAMUSCULAR | Qty: 10 | Status: AC

## 2020-07-28 MED FILL — Calcium Chloride Inj 10%: INTRAVENOUS | Qty: 10 | Status: AC

## 2020-07-28 MED FILL — Potassium Chloride Inj 2 mEq/ML: INTRAVENOUS | Qty: 40 | Status: AC

## 2020-07-28 MED FILL — Heparin Sodium (Porcine) Inj 1000 Unit/ML: INTRAMUSCULAR | Qty: 30 | Status: AC

## 2020-07-28 MED FILL — Electrolyte-R (PH 7.4) Solution: INTRAVENOUS | Qty: 5000 | Status: AC

## 2020-07-28 MED FILL — Sodium Bicarbonate IV Soln 8.4%: INTRAVENOUS | Qty: 50 | Status: AC

## 2020-07-28 MED FILL — Lidocaine HCl Local Soln Prefilled Syringe 100 MG/5ML (2%): INTRAMUSCULAR | Qty: 5 | Status: AC

## 2020-07-28 MED FILL — Mannitol IV Soln 20%: INTRAVENOUS | Qty: 500 | Status: AC

## 2020-07-28 NOTE — Evaluation (Signed)
Occupational Therapy Evaluation Patient Details Name: Roberto Morrison MRN: [id number] DOB: 1970/06/13 Today's Date: 07/28/2020    History of Present Illness 50 yo admitted 6/13 with chest pain while working out. Pt with NSTEMI s/p cath. 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 No significant PMhx   Clinical Impression   PTA pt independent. Admitted for above and presenting with problem list below, including decreased activity tolerance, increased RR with activity, and sternal precautions. Pt completing transfers and in room mobility with min guard, ADLs with up to min guard assist. Reviewed sternal precautions and ADL compensatory techniques. Cueing for PLB throughout session.  VSS during session, RR up to 35 with minimal activity. Based on performance today, believe he will benefit from continued OT services acutely to optimize independence and safety but anticipate no further needs after dc home.     Follow Up Recommendations  No OT follow up;Supervision - Intermittent    Equipment Recommendations  None recommended by OT    Recommendations for Other Services       Precautions / Restrictions Precautions Precautions: Sternal;Fall Precaution Booklet Issued: Yes (comment) Precaution Comments: chest tube Restrictions Weight Bearing Restrictions: Yes Other Position/Activity Restrictions: sternal precautions      Mobility Bed Mobility Overal bed mobility: Needs Assistance Bed Mobility: Sit to Supine       Sit to supine: Min assist   General bed mobility comments: min assist to bring LEs back to supine, cueing for techniques    Transfers Overall transfer level: Needs assistance Equipment used: Rolling walker (2 wheeled) Transfers: Sit to/from Stand Sit to Stand: Min guard         General transfer comment: cues for hand placement with assist to manage lines    Balance Overall balance assessment: Needs assistance   Sitting balance-Leahy Scale: Good     Standing  balance support: Bilateral upper extremity supported;During functional activity Standing balance-Leahy Scale: Poor Standing balance comment: bil UE supported on RW dynamically                           ADL either performed or assessed with clinical judgement   ADL Overall ADL's : Needs assistance/impaired     Grooming: Set up;Sitting           Upper Body Dressing : Minimal assistance;Sitting   Lower Body Dressing: Min guard;Sit to/from stand Lower Body Dressing Details (indicate cue type and reason): able to don/doff socks, min guard sit to stand Toilet Transfer: Min guard;Ambulation;RW Toilet Transfer Details (indicate cue type and reason): simulated in room         Functional mobility during ADLs: Min guard;Rolling walker;Cueing for safety General ADL Comments: pt limited by pain, sternal precautions     Vision         Perception     Praxis      Pertinent Vitals/Pain Pain Assessment: Faces Faces Pain Scale: Hurts even more Pain Location: incision Pain Descriptors / Indicators: Aching;Operative site guarding Pain Intervention(s): Limited activity within patient's tolerance;Monitored during session;Repositioned     Hand Dominance     Extremity/Trunk Assessment Upper Extremity Assessment Upper Extremity Assessment: Overall WFL for tasks assessed   Lower Extremity Assessment Lower Extremity Assessment: Defer to PT evaluation   Cervical / Trunk Assessment Cervical / Trunk Assessment: Normal   Communication Communication Communication: No difficulties   Cognition Arousal/Alertness: Awake/alert Behavior During Therapy: WFL for tasks assessed/performed Overall Cognitive Status: Within Functional Limits for tasks assessed  General Comments  VSS on RA, grilfriend present    Exercises     Shoulder Instructions      Home Living Family/patient expects to be discharged to:: Private  residence Living Arrangements: Alone Available Help at Discharge: Family;Available 24 hours/day Type of Home: House Home Access: Level entry     Home Layout: Two level Alternate Level Stairs-Number of Steps: 14 Alternate Level Stairs-Rails: None Bathroom Shower/Tub: Occupational psychologist: Standard     Home Equipment: Shower seat - built in          Prior Functioning/Environment Level of Independence: Independent        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        OT Problem List: Impaired balance (sitting and/or standing);Decreased activity tolerance;Decreased knowledge of use of DME or AE;Decreased knowledge of precautions;Cardiopulmonary status limiting activity      OT Treatment/Interventions: Self-care/ADL training;DME and/or AE instruction;Therapeutic activities;Patient/family education;Energy conservation    OT Goals(Current goals can be found in the care plan section) Acute Rehab OT Goals Patient Stated Goal: return home, back to working out OT Goal Formulation: With patient Time For Goal Achievement: 08/11/20 Potential to Achieve Goals: Good  OT Frequency: Min 2X/week   Barriers to D/C:            Co-evaluation              AM-PAC OT "6 Clicks" Daily Activity     Outcome Measure Help from another person eating meals?: None Help from another person taking care of personal grooming?: A Little Help from another person toileting, which includes using toliet, bedpan, or urinal?: A Little Help from another person bathing (including washing, rinsing, drying)?: A Little Help from another person to put on and taking off regular upper body clothing?: A Little Help from another person to put on and taking off regular lower body clothing?: A Little 6 Click Score: 19   End of Session Equipment Utilized During Treatment: Rolling walker Nurse Communication: Mobility status  Activity Tolerance: Patient tolerated  treatment well Patient left: in bed;with call bell/phone within reach;with family/visitor present  OT Visit Diagnosis: Other abnormalities of gait and mobility (R26.89);Pain Pain - part of body:  (chest- incisonal)                Time: 5056-9794 OT Time Calculation (min): 20 min Charges:  OT General Charges $OT Visit: 1 Visit OT Evaluation $OT Eval Moderate Complexity: 1 Mod  Roberto Morrison, OT Acute Rehabilitation Services Pager [phone] Office [phone]   Delight Stare 07/28/2020, 10:44 AM

## 2020-07-28 NOTE — Progress Notes (Signed)
2 Days Post-Op Procedure(s) (LRB): CORONARY ARTERY BYPASS GRAFTING (CABG) X THREE, USING LEFT INTERNAL MAMMARY ARTERY< RIGHT INTERNAL MAMMARY ARTERY AND LEFT RADIAL ARTERY CONDUITS. (N/A) TRANSESOPHAGEAL ECHOCARDIOGRAM (TEE) (N/A) INDOCYANINE GREEN FLUORESCENCE IMAGING (ICG) (N/A) RADIAL ARTERY HARVEST (Left) INTERCOSTAL NERVE BLOCK APPLICATION OF CELL SAVER Subjective: Feeling better  Objective: Vital signs in last 24 hours: Temp:  [97.6 F (36.4 C)-99.1 F (37.3 C)] 99.1 F (37.3 C) (06/23 0815) Pulse Rate:  [78-100] 93 (06/23 0800) Cardiac Rhythm: Normal sinus rhythm (06/23 0800) Resp:  [13-41] 17 (06/23 0800) BP: (97-140)/(50-88) 118/63 (06/23 0800) SpO2:  [90 %-97 %] 93 % (06/23 0800) Arterial Line BP: (164)/(72) 164/72 (06/22 0900) Weight:  [128.6 kg] 128.6 kg (06/23 0500)  Hemodynamic parameters for last 24 hours:    Intake/Output from previous day: 06/22 0701 - 06/23 0700 In: 2221.8 [P.O.:960; I.V.:1008.4; IV Piggyback:253.4] Out: 9774 [Urine:1215; Chest Tube:580] Intake/Output this shift: Total I/O In: 51.5 [I.V.:4.9; IV Piggyback:46.5] Out: 125 [Urine:125]  General appearance: alert and cooperative Neurologic: intact Heart: regular rate and rhythm, S1, S2 normal, no murmur, click, rub or gallop Lungs: clear to auscultation bilaterally Abdomen: soft, non-tender; bowel sounds normal; no masses,  no organomegaly Extremities: edema mild Wound: dressed, dry  Lab Results: Recent Labs    07/27/20 1715 07/28/20 0519  WBC 13.2* 10.0  HGB 13.1 12.4*  HCT 39.9 36.4*  PLT 156 125*   BMET:  Recent Labs    07/27/20 1715 07/28/20 0519  NA 133* 136  K 4.1 4.0  CL 102 104  CO2 26 28  GLUCOSE 135* 120*  BUN 22* 19  CREATININE 1.61* 1.40*  CALCIUM 8.4* 8.0*    PT/INR:  Recent Labs    07/26/20 1412  LABPROT 15.8*  INR 1.3*   ABG    Component Value Date/Time   PHART 7.325 (L) 07/26/2020 1848   HCO3 21.0 07/26/2020 1848   TCO2 22 07/26/2020 1848    ACIDBASEDEF 5.0 (H) 07/26/2020 1848   O2SAT 63.4 07/28/2020 0542   CBG (last 3)  Recent Labs    07/27/20 2350 07/28/20 0354 07/28/20 0816  GLUCAP 131* 127* 153*    Assessment/Plan: S/P Procedure(s) (LRB): CORONARY ARTERY BYPASS GRAFTING (CABG) X THREE, USING LEFT INTERNAL MAMMARY ARTERY< RIGHT INTERNAL MAMMARY ARTERY AND LEFT RADIAL ARTERY CONDUITS. (N/A) TRANSESOPHAGEAL ECHOCARDIOGRAM (TEE) (N/A) INDOCYANINE GREEN FLUORESCENCE IMAGING (ICG) (N/A) RADIAL ARTERY HARVEST (Left) INTERCOSTAL NERVE BLOCK APPLICATION OF CELL SAVER Mobilize Diuresis d/c tubes/lines Continue foley due to need for diuresis Transfer to progressive/tele   LOS: 7 days    Wonda Olds 07/28/2020

## 2020-07-28 NOTE — Progress Notes (Signed)
Misha RN aware of order to d/c CVC.

## 2020-07-28 NOTE — Addendum Note (Signed)
Addendum  created 07/28/20 8756 by Roberts Gaudy, MD   Clinical Note Signed

## 2020-07-28 NOTE — Progress Notes (Signed)
Anesthesiology Follow-up:  50 year old male two days S/P CABG X 3 following Non-Stemi. Awake and alert, sitting in chair, neuro intact, hemodynamically stable on milrinone at 0.125 mcg/kg/min   VS: T- 36.4 BP-118/63 HR- 79 (SR) RR- 17 O2 Sat 93% on RA  K- 4.0 Na- 136 BUN/Cr- 19/1.40 H/H- 12.4/36.4 Plats- 125,000  Extubated 4 hours post-op.   Stable post-op course,  creatinine 1.40 near baseline, suspect secondary to large muscle mass.  Roberts Gaudy

## 2020-07-28 NOTE — Progress Notes (Signed)
Physical Therapy Treatment Patient Details Name: Roberto Morrison MRN: [id number] DOB: [birth year] Today's Date: 07/28/2020    History of Present Illness 50 yo admitted 6/13 with chest pain while working out. Pt with NSTEMI s/p cath. 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 No significant PMhx    PT Comments    Pt with significant improvement in function, mobility and respiratory status from prior session. Pt able to state 3/4 precautions with education for all and not to hold his breath during transfers. Pt able to walk on RA and perform HEP without difficulty. Will plan to attempt stairs next session as pt not yet ready today.   HR 88-93 Pre gait 134/67 (81) Post gait 126/91 (103) SpO2 92-98% on RA    Follow Up Recommendations  No PT follow up     Equipment Recommendations  None recommended by PT    Recommendations for Other Services       Precautions / Restrictions Precautions Precautions: Sternal;Fall Precaution Booklet Issued: Yes (comment) Precaution Comments: chest tube Restrictions Weight Bearing Restrictions: Yes Other Position/Activity Restrictions: sternal precautions    Mobility  Bed Mobility Overal bed mobility: Needs Assistance Bed Mobility: Rolling;Sidelying to Sit Rolling: Supervision Sidelying to sit: Supervision   Sit to supine: Min assist   General bed mobility comments: cues for sequence    Transfers Overall transfer level: Modified independent Equipment used: Rolling walker (2 wheeled) Transfers: Sit to/from Stand Sit to Stand: Min guard         General transfer comment: cues for hand placement with assist to manage lines  Ambulation/Gait Ambulation/Gait assistance: Supervision Gait Distance (Feet): 300 Feet Assistive device: Rolling walker (2 wheeled) Gait Pattern/deviations: Step-through pattern;Decreased stride length   Gait velocity interpretation: 1.31 - 2.62 ft/sec, indicative of limited community ambulator General Gait  Details: RW more for holding foley then needed for balance with pt able to demonstrate improved breathing and activity tolerance with increased stride, cues for direction   Stairs             Wheelchair Mobility    Modified Rankin (Stroke Patients Only)       Balance Overall balance assessment: Mild deficits observed, not formally tested   Sitting balance-Leahy Scale: Good     Standing balance support: Bilateral upper extremity supported;During functional activity Standing balance-Leahy Scale: Poor Standing balance comment: bil UE supported on RW dynamically                            Cognition Arousal/Alertness: Awake/alert Behavior During Therapy: WFL for tasks assessed/performed Overall Cognitive Status: Within Functional Limits for tasks assessed                                        Exercises General Exercises - Lower Extremity Long Arc Quad: AROM;Both;Seated;20 reps Hip Flexion/Marching: AROM;Both;Seated;20 reps    General Comments General comments (skin integrity, edema, etc.): VSS on RA, grilfriend present      Pertinent Vitals/Pain Pain Assessment: Faces Pain Score: 5  Faces Pain Scale: Hurts even more Pain Location: incision Pain Descriptors / Indicators: Aching;Operative site guarding Pain Intervention(s): Limited activity within patient's tolerance;Monitored during session;Repositioned    Home Living Family/patient expects to be discharged to:: Private residence Living Arrangements: Alone Available Help at Discharge: Family;Available 24 hours/day Type of Home: House Home Access: Level entry   Home Layout:  Two level Home Equipment: Shower seat - built in      Prior Function Level of Independence: Independent      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   PT Goals (current goals can now be found in the care plan section) Acute Rehab PT Goals Patient Stated Goal: return home, back  to working out Progress towards PT goals: Progressing toward goals    Frequency    Min 3X/week      PT Plan Discharge plan needs to be updated    Co-evaluation              AM-PAC PT "6 Clicks" Mobility   Outcome Measure  Help needed turning from your back to your side while in a flat bed without using bedrails?: A Little Help needed moving from lying on your back to sitting on the side of a flat bed without using bedrails?: A Little Help needed moving to and from a bed to a chair (including a wheelchair)?: None Help needed standing up from a chair using your arms (e.g., wheelchair or bedside chair)?: None Help needed to walk in hospital room?: A Little Help needed climbing 3-5 steps with a railing? : A Little 6 Click Score: 20    End of Session   Activity Tolerance: Patient tolerated treatment well Patient left: in chair;with call bell/phone within reach;with family/visitor present Nurse Communication: Mobility status;Precautions PT Visit Diagnosis: Other abnormalities of gait and mobility (R26.89);Difficulty in walking, not elsewhere classified (R26.2)     Time: 4818-5909 PT Time Calculation (min) (ACUTE ONLY): 25 min  Charges:  $Gait Training: 8-22 mins $Therapeutic Activity: 8-22 mins                     Nino Amano P, PT Acute Rehabilitation Services Pager: [phone] Office: [place] 07/28/2020, 12:47 PM

## 2020-07-29 ENCOUNTER — Inpatient Hospital Stay (HOSPITAL_COMMUNITY): Payer: 59

## 2020-07-29 ENCOUNTER — Other Ambulatory Visit (HOSPITAL_COMMUNITY): Payer: Self-pay

## 2020-07-29 DIAGNOSIS — Z951 Presence of aortocoronary bypass graft: Secondary | ICD-10-CM

## 2020-07-29 LAB — GLUCOSE, CAPILLARY: Glucose-Capillary: 119 mg/dL — ABNORMAL HIGH (ref 70–99)

## 2020-07-29 LAB — BASIC METABOLIC PANEL
Anion gap: 9 (ref 5–15)
BUN: 20 mg/dL (ref 6–20)
CO2: 27 mmol/L (ref 22–32)
Calcium: 8.5 mg/dL — ABNORMAL LOW (ref 8.9–10.3)
Chloride: 99 mmol/L (ref 98–111)
Creatinine, Ser: 1.46 mg/dL — ABNORMAL HIGH (ref 0.61–1.24)
GFR, Estimated: 59 mL/min — ABNORMAL LOW (ref >60)
Glucose, Bld: 111 mg/dL — ABNORMAL HIGH (ref 70–99)
Potassium: 3.9 mmol/L (ref 3.5–5.1)
Sodium: 135 mmol/L (ref 135–145)

## 2020-07-29 MED ORDER — ASPIRIN EC 81 MG PO TBEC: 81.0000 mg | DELAYED_RELEASE_TABLET | Freq: Every day | ORAL | Status: DC

## 2020-07-29 MED ORDER — ASPIRIN 81 MG PO CHEW
81.0000 mg | CHEWABLE_TABLET | Freq: Every day | ORAL | Status: DC
  Administered 2020-07-30: 81 mg
  Filled 2020-07-29: qty 1

## 2020-07-29 MED ORDER — METOPROLOL TARTRATE 25 MG PO TABS
25.0000 mg | ORAL_TABLET | Freq: Two times a day (BID) | ORAL | Status: DC
  Administered 2020-07-29 – 2020-07-30 (×3): 25 mg via ORAL
  Filled 2020-07-29 (×3): qty 1

## 2020-07-29 MED ORDER — CLOPIDOGREL BISULFATE 75 MG PO TABS
75.0000 mg | ORAL_TABLET | Freq: Every day | ORAL | Status: DC
  Administered 2020-07-29 – 2020-07-30 (×2): 75 mg via ORAL
  Filled 2020-07-29 (×2): qty 1

## 2020-07-29 MED ORDER — METOPROLOL TARTRATE 25 MG/10 ML ORAL SUSPENSION
12.5000 mg | Freq: Two times a day (BID) | ORAL | Status: DC
  Filled 2020-07-29 (×3): qty 5

## 2020-07-29 NOTE — Progress Notes (Addendum)
[address]       [place] [address]             [phone]        3 Days Post-Op Procedure(s) (LRB): CORONARY ARTERY BYPASS GRAFTING (CABG) X THREE, USING LEFT INTERNAL MAMMARY ARTERY< RIGHT INTERNAL MAMMARY ARTERY AND LEFT RADIAL ARTERY CONDUITS. (N/A) TRANSESOPHAGEAL ECHOCARDIOGRAM (TEE) (N/A) INDOCYANINE GREEN FLUORESCENCE IMAGING (ICG) (N/A) RADIAL ARTERY HARVEST (Left) INTERCOSTAL NERVE BLOCK APPLICATION OF CELL SAVER Subjective: Awake and alert, walking around his room. No new concerns.  Tolerating PO's, BM yesterday, on RA with adequate sats.    Wt is 4kg below pre-op.   Objective: Vital signs in last 24 hours: Temp:  [97.9 F (36.6 C)-99.1 F (37.3 C)] 98.3 F (36.8 C) (06/24 0737) Pulse Rate:  [88-101] 95 (06/24 0737) Cardiac Rhythm: Normal sinus rhythm (06/24 0745) Resp:  [17-35] 20 (06/24 0737) BP: (120-150)/(50-125) 120/77 (06/24 0737) SpO2:  [91 %-99 %] 94 % (06/24 0737) Weight:  [126.2 kg] 126.2 kg (06/24 0406)   Intake/Output from previous day: 06/23 0701 - 06/24 0700 In: 775.2 [P.O.:720; I.V.:8.7; IV Piggyback:46.5] Out: 7180 [Urine:7180] Intake/Output this shift: Total I/O In: -  Out: 750 [Urine:750]  General appearance: alert, cooperative, and no distress Neurologic: intact Heart: regular rhythm, monitor showing sinus tach. Lungs: clear to auscultation bilaterally Abdomen: soft, NT Extremities: no edema, the left radial incision is intact and dry. Has some numbness in his left thumb that he says was present before this admission.  Wound: the sternal Aquacel dressing was removed. Has a few segments with skin-edge venous oozing. A sterile gauze dressing was applied.   Lab Results: Recent Labs    07/27/20 1715 07/28/20 0519  WBC 13.2* 10.0  HGB 13.1 12.4*  HCT 39.9 36.4*  PLT 156 125*   BMET:  Recent Labs    07/28/20 0519 07/29/20 0119  NA 136 135  K 4.0 3.9  CL 104 99  CO2 28 27  GLUCOSE 120* 111*  BUN  19 20  CREATININE 1.40* 1.46*  CALCIUM 8.0* 8.5*    PT/INR:  Recent Labs    07/26/20 1412  LABPROT 15.8*  INR 1.3*   ABG    Component Value Date/Time   PHART 7.325 (L) 07/26/2020 1848   HCO3 21.0 07/26/2020 1848   TCO2 22 07/26/2020 1848   ACIDBASEDEF 5.0 (H) 07/26/2020 1848   O2SAT 63.4 07/28/2020 0542   CBG (last 3)  Recent Labs    07/28/20 1632 07/28/20 2141 07/29/20 0623  GLUCAP 121* 106* 119*    CLINICAL DATA:  Post CABG.   EXAM: CHEST - 2 VIEW   COMPARISON:  07/28/2020   FINDINGS: Right jugular central line has been removed. Chest drains have been removed. No significant pneumothorax. Again noted are linear and bandlike densities in the upper lungs that are most compatible with atelectasis. Slight elevation of the right hemidiaphragm. Patient has median sternotomy wires. Heart size is stable. The trachea is midline. No significant pleural fluid.   IMPRESSION: 1. Removal of chest drains.  Negative for pneumothorax. 2. Persistent densities in the upper lungs that are most compatible with atelectasis.     Electronically Signed   By: Markus Daft M.D.   On: 07/29/2020 08:49   Assessment/Plan: S/P Procedure(s) (LRB): CORONARY ARTERY BYPASS GRAFTING (CABG) X THREE, USING LEFT INTERNAL MAMMARY ARTERY< RIGHT INTERNAL MAMMARY ARTERY AND LEFT RADIAL ARTERY CONDUITS. (N/A) TRANSESOPHAGEAL ECHOCARDIOGRAM (TEE) (N/A) INDOCYANINE GREEN FLUORESCENCE IMAGING (ICG) (N/A) RADIAL  ARTERY HARVEST (Left) INTERCOSTAL NERVE BLOCK APPLICATION OF CELL SAVER  -POD3 CABG x 3, EF 30-35% pre-op. Pt presented with acute NSTEMI. Progressing well with mobility. Continue ASA, statin, and oral nitrate for arterial graft patency. Stable BP and mild tachycardia- will increase the metoprolol to 25mg  BID. Add Plavix since he presented with acute coronary syndrome. Remove pacer wires today.   -Volume excess- resolved. Wt well below pre-op, creat trending up (baseline ~1.4).  Will stop  Lasix. D/C the Foley catheter. BMP in AM.   -Mild expected acute blood loss anemia- stable, no indication for intervention.   -Endo- no h/o DM, A1C 5.5 pre-op. OK to stop glucometers and SSI.   -Disposition- anticipation discharge to home tomorrow if progress continues.    LOS: 8 days    Antony Odea, [place] [phone] 07/29/2020

## 2020-07-29 NOTE — TOC Benefit Eligibility Note (Signed)
Patient Teacher, English as a foreign language completed.    The patient is currently admitted and upon discharge could be taking Jardiance 10 mg.  The current 30 day co-pay is, $35.00.   The patient is currently admitted and upon discharge could be taking Bidil (Generic).  The current 30 day co-pay is, $10.00.   The patient is currently admitted and upon discharge could be taking Entresto 24mg /26 mg.  The current 30 day co-pay is, $35.00.   The patient is currently admitted and upon discharge could be taking Farxiga 10 mg.  Requires Prior Authorization  The patient is insured through [place], [place] Patient Advocate Specialist [place] Team Direct Number: [id number]  Fax: [phone]

## 2020-07-29 NOTE — Progress Notes (Signed)
Patient ambulated in hallway independently. Chanel Mckesson, Bettina Gavia RN

## 2020-07-29 NOTE — Discharge Summary (Signed)
.  Discharge Summary  Patient ID: Roberto Morrison MRN: [id number] DOB/AGE: January 14, 1971 50 y.o.  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 Evaluation in ED notable for elevated troponins. 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 Echo showed reduced LVEF. No other significant medical problems. Referred for CABG. Has been sx-free since admitted to hospital.   Hospital Course:   Roberto Morrison remained stable following left heart catheterization.  He was taken to the operating room on 07/26/2020 where three-vessel coronary bypass grafting was accomplished.  Please see the operative note below for details.  He separated from cardiopulmonary bypass on milrinone and Neo-Synephrine infusions.  He was transferred to the surgical ICU in stable condition.  His hemodynamics and respiratory status remained stable.  He was weaned from the ventilator and extubated by 6:30 PM on the day of surgery.  Improving.  Oral oral nitrates in order to optimize the arterial graft patency.  He was mobilized on the first postoperative day.  He progressed well with ambulation.  Into the second postoperative day, he was ready for transfer to the progressive care unit.  Diuresed as required.  By the third postoperative day, his weight was well below the preoperative weight.  IV Lasix was discontinued and the Foley catheter was removed.  Patient wires were also removed on postop day 3.  His incisions were healing with no evidence of complication.  He did mention some numbness in his left thumb but he stated this was present prior to this admission.  He  maintained excellent perfusion to his left hand.     Consults: cardiology  Significant Diagnostic Studies:   EXAM: CHEST - 2 VIEW   COMPARISON:  07/28/2020   FINDINGS: Right jugular central line has been removed. Chest drains have been removed. No significant pneumothorax. Again noted are linear and bandlike densities in the upper lungs that are most compatible with atelectasis. Slight elevation of the right hemidiaphragm. Patient has median sternotomy wires. Heart size is stable. The trachea is midline. No significant pleural fluid.   IMPRESSION: 1. Removal of chest drains.  Negative for pneumothorax. 2. Persistent densities in the upper lungs that are most compatible with atelectasis.     Electronically Signed   By: Markus Daft M.D.   On: 07/29/2020 08:49  Treatments:   CARDIOTHORACIC SURGERY OPERATIVE NOTE   Date of Procedure:    07/26/2020   Preoperative Diagnosis:      Severe 3-vessel Coronary Artery Disease   Postoperative Diagnosis:    Same   Procedure:        Coronary Artery Bypass Grafting x 3             Left Internal Mammary Artery to Distal Left Anterior Descending Coronary Artery; pedicled right internal mammary artery to Posterior Descending Coronary Artery; left radial artery graft to obtuse Marginal Branch of Left Circumflex Coronary Artery Open left radial artery harvest Bilateral internal mammary artery harvesting Completion graft surveillance with indocyanine green fluorescence imaging   Surgeon:        B.  Murvin Natal, MD   Assistant:       Macarthur Critchley,  PA-C   Anesthesia:    General   Operative Findings: Preserved left ventricular systolic function Good quality internal mammary artery conduits Good quality radial artery conduit Good quality target vessels for grafting       BRIEF CLINICAL NOTE AND INDICATIONS FOR SURGERY   50 year old man without significant past medical history presented with chest pain while working out.  He was  demonstrated to have an NSTEMI.  Work-up showed severe three-vessel coronary artery disease.  He was referred for CABG.  He was thoroughly evaluated and is considered an excellent candidate for the procedure  Discharge Exam: Blood pressure 119/79, pulse 96, temperature 98.3 F (36.8 C), temperature source Oral, resp. rate 17, height 6' (1.829 m), weight 124.3 kg, SpO2 96 %.  General appearance: alert, cooperative, and no distress Neurologic: intact Heart: regular rhythm, monitor showing sinus rhythm at 95-100/mn. Lungs: clear to auscultation bilaterally Abdomen: soft, NT Extremities: no edema, the left radial incision is intact and dry. Has some numbness in his left thumb that he says was present before this admission. This is unchanged from yesterday. The left hand is warm and well perfused. Wound: the sternal incisions and left arm incision are intact and dry.   Disposition:   Discharge Instructions     Amb Referral to Cardiac Rehabilitation   Complete by: As directed    To [place]   Diagnosis:  CABG NSTEMI     CABG X ___: 3   After initial evaluation and assessments completed: Virtual Based Care may be provided alone or in conjunction with Phase 2 Cardiac Rehab based on patient barriers.: Yes      Allergies as of 07/30/2020       Reactions   Prednisone Other (See Comments)   "Hiccups"        Medication List     TAKE these medications    aspirin 81 MG EC tablet Take 1 tablet (81 mg total) by mouth daily. Swallow whole. Start taking on: July 31, 2020   atorvastatin 80 MG tablet Commonly known as: LIPITOR Take 1 tablet (80 mg total) by mouth daily. Start taking on: July 31, 2020   clopidogrel 75 MG tablet Commonly known as: PLAVIX Take 1 tablet (75 mg total) by mouth daily. Start taking on: July 31, 2020   colchicine 0.6 MG tablet Take 1 tablet (0.6 mg total) by mouth 2 (two) times daily.   empagliflozin 10 MG Tabs tablet Commonly known as:  JARDIANCE Take 1 tablet (10 mg total) by mouth daily before breakfast.   isosorbide dinitrate 10 MG tablet Commonly known as: ISORDIL Take 1 tablet (10 mg total) by mouth 3 (three) times daily.   metoprolol succinate 50 MG 24 hr tablet Commonly known as: Toprol XL Take 1 tablet (50 mg total) by mouth daily. Take with or immediately following a meal.   traMADol 50 MG tablet Commonly known as: ULTRAM Take 1-2 tablets (50-100 mg total) by mouth every 4 (four) hours as needed for up to 5 days for moderate pain.        Follow-up Information     Wonda Olds, MD. Go on 08/09/2020.   Specialty: Cardiothoracic Surgery Why: Your appointment is at 3 PM.  Please arrive 30 minutes early for chest x-ray to be performed by [facility] located on the first floor of the same building. Contact information: [address] [place] [address] [phone]         [facility]. Go on  08/15/2020.   Specialty: Cardiology Why: Your appointment with Laurann Montana is at 11:30 AM. Contact information: [address] [place] [address] [phone]              The patient has been discharged on:   1.Beta Blocker:  Yes [ y  ]                              No   [   ]                              If No, reason:  2.Ace Inhibitor/ARB: Yes [   ]                                     No  [  y  ]                                     If No, reason: BP soft  3.Statin:   Yes [ x  ]                  No  [   ]                  If No, reason:  4.Ecasa:  Yes  [  x ]                  No   [   ]                  If No, reason:  5. P2Y12 Inhibitor:   Yes    Signed: Antony Odea, PA-C 07/30/2020, 8:43 AM

## 2020-07-29 NOTE — Progress Notes (Signed)
CARDIAC REHAB PHASE I   PRE:  Rate/Rhythm: 103 ST    BP: sitting 98/56    SaO2: 98 RA  MODE:  Ambulation: 790 ft   POST:  Rate/Rhythm: 109 ST    BP: sitting 115/57     SaO2: 98 Ra   Pt up moving around room independently, following sternal precautions. Ambulated independently in hall, appropriate pace. Pt asking good questions regarding home life and restrictions. To recliner, no c/o.   Discussed IS, sternal precautions, exercise, diet, and CRPII with pt. Very receptive. Will refer to Tiro to help him transition back to his exercise eventually. Pt can walk independently on his own. RN attests that he is getting out of bed following sternal precautions. [phone]  [place], ACSM 07/29/2020 10:27 AM

## 2020-07-29 NOTE — Progress Notes (Signed)
Physical Therapy Treatment Patient Details Name: Roberto Morrison MRN: [id number] DOB: 04-Oct-1970 Today's Date: 07/29/2020    History of Present Illness 50 yo admitted 6/13 with chest pain while working out. Pt with NSTEMI s/p cath. 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 No significant PMH.    PT Comments    Pt received in supine, agreeable to therapy session, significant other present and encouraging. Pt making excellent progress with mobility and able to verbalize Move in the Tube/sternal precautions and with good compliance throughout session. Emphasis on activity pacing, stair safety, and gradual progression of mobility/walking program with close monitoring of respiratory rate and symptoms. Pt scored 22/24 on DGI, indicating low fall risk and demonstrates good safety awareness with all tasks. Pt at modI to Independent level for all mobility tasks, goals met, will sign off acute PT per discussion with pt and supervising PT Ryan L.   Follow Up Recommendations  No PT follow up     Equipment Recommendations  None recommended by PT    Recommendations for Other Services       Precautions / Restrictions Precautions Precautions: Sternal;Fall Precaution Booklet Issued: Yes (comment) (previously given) Restrictions Weight Bearing Restrictions: No Other Position/Activity Restrictions: sternal precs    Mobility  Bed Mobility Overal bed mobility: Needs Assistance Bed Mobility: Rolling;Sidelying to Sit Rolling: Independent Sidelying to sit: Independent       General bed mobility comments: good compliance with sternal precs no cues needed    Transfers Overall transfer level: Independent               General transfer comment: from EOB  Ambulation/Gait Ambulation/Gait assistance: Independent Gait Distance (Feet): 350 Feet Assistive device: None Gait Pattern/deviations: Step-through pattern Gait velocity: 0.6-1.0 m/s Gait velocity interpretation: >2.62 ft/sec, indicative  of community ambulatory General Gait Details: increased respiratory rate so occasional cues for activity pacing but VSS on RA   Stairs Stairs: Yes Stairs assistance: Modified independent (Device/Increase time) Stair Management: No rails;Alternating pattern;Step to pattern;Forwards Number of Stairs: 20 (10 steps up/down x2 trials) General stair comments: cues for safety/sequencing and activity pacing, pt ascended/descended 10 steps x2 trials and brief standing break between trials HR to 111 bpm with exertion and RR to 30 rpm; pt used step-to pattern descending for safety, no LOB   Wheelchair Mobility    Modified Rankin (Stroke Patients Only)       Balance Overall balance assessment: Independent                               Standardized Balance Assessment Standardized Balance Assessment : Dynamic Gait Index   Dynamic Gait Index Level Surface: Normal Change in Gait Speed: Normal Gait with Horizontal Head Turns: Normal Gait with Vertical Head Turns: Normal Gait and Pivot Turn: Normal Step Over Obstacle: Normal Step Around Obstacles: Normal Steps: Moderate Impairment Total Score: 22      Cognition Arousal/Alertness: Awake/alert Behavior During Therapy: WFL for tasks assessed/performed Overall Cognitive Status: Within Functional Limits for tasks assessed                                           General Comments General comments (skin integrity, edema, etc.): HR to 111 bpm with exertion and 93 bpm resting; SpO2 94-100% on RA, Pt pulling to [phone] on IS x5 reps, encouraged 10  reps hourly; discussion on safe progression of activity and monitoring respiratory rate, rest breaks when RR >25 rpm with exertion.      Pertinent Vitals/Pain Pain Assessment: Faces Faces Pain Scale: Hurts a little bit Pain Location: incisional Pain Descriptors / Indicators: Aching;Operative site guarding Pain Intervention(s): Monitored during session     PT  Goals (current goals can now be found in the care plan section) Acute Rehab PT Goals Patient Stated Goal: return home, back to working out PT Goal Formulation: With patient/family Time For Goal Achievement: 08/10/20 Potential to Achieve Goals: Good Progress towards PT goals: Goals met/education completed, patient discharged from PT    Frequency    Min 3X/week      PT Plan Current plan remains appropriate    AM-PAC PT "6 Clicks" Mobility   Outcome Measure  Help needed turning from your back to your side while in a flat bed without using bedrails?: None Help needed moving from lying on your back to sitting on the side of a flat bed without using bedrails?: None Help needed moving to and from a bed to a chair (including a wheelchair)?: None Help needed standing up from a chair using your arms (e.g., wheelchair or bedside chair)?: None Help needed to walk in hospital room?: None Help needed climbing 3-5 steps with a railing? : None 6 Click Score: 24    End of Session   Activity Tolerance: Patient tolerated treatment well Patient left: in chair;with call bell/phone within reach;with family/visitor present Nurse Communication: Mobility status PT Visit Diagnosis: Other abnormalities of gait and mobility (R26.89);Difficulty in walking, not elsewhere classified (R26.2)     Time: 2956-2130 PT Time Calculation (min) (ACUTE ONLY): 15 min  Charges:  $Gait Training: 8-22 mins                     Roberto Morrison P., PTA Acute Rehabilitation Services Pager: [phone] Office: [place] 07/29/2020, 4:34 PM

## 2020-07-29 NOTE — Progress Notes (Addendum)
CARDIOLOGY RECOMMENDATIONS:  Discharge is anticipated in the next 48 hours. Recommendations for medications and follow up:  Discharge Medications: Continue medications as they are currently listed in the [facility]. Consider consolidating lopressor to metoprolol succinate for CM Continue isordil for radial artery graft - could consider imdur for ease of dosing vs bidil for CM, will defer to CT surgery (note: bidil will be $10) Consider adding SGLT2 for CM/CAD - jardiance is preferred with his insurance  Follow Up: The patient's Primary Cardiologist is Quay Burow, MD Follow up in the office in 2 week(s).   Follow up note with medication prices will be entered.   Signed,  Tami Lin [place], [place]  11:08 AM 07/29/2020  [facility]   Patient seen and examined. Agree with assessment and plan.Pt feels well; day 3 s/p CABG x3 with arterial conduits (LIMA to LAD, RIMA to PDA, and left radial artery graft to OM branch of the left circumflex coronary artery.  Prevascularization, echo Doppler study on July 22, 2020 showed an EF of 30 to 35%.  Hopefully with successful revascularization there will be significant improvement in LV function.  Intra-operative echo on TEE was 40 to 45%.  Currently afebrile, blood pressure 120/70, O2 sats 93 to 96%.  ST pulse around 90.  He is euvolemic on exam.  No JVD.  Regular rhythm no S3 gallop.  Incisions are healing well.  No edema.  Agree with recommendations as above.   Troy Sine, MD, [facility] 07/29/2020 12:20 PM

## 2020-07-29 NOTE — Progress Notes (Signed)
Patient EPW pulled per protocol and as ordered. All ends intact. Patient reminded to lie supine approximately one hour. Bp 121/75 heart rate 99. Marland KitchenCloer, Bettina Gavia rN

## 2020-07-30 LAB — BASIC METABOLIC PANEL
Anion gap: 7 (ref 5–15)
BUN: 19 mg/dL (ref 6–20)
CO2: 26 mmol/L (ref 22–32)
Calcium: 8.5 mg/dL — ABNORMAL LOW (ref 8.9–10.3)
Chloride: 105 mmol/L (ref 98–111)
Creatinine, Ser: 1.46 mg/dL — ABNORMAL HIGH (ref 0.61–1.24)
GFR, Estimated: 59 mL/min — ABNORMAL LOW (ref >60)
Glucose, Bld: 128 mg/dL — ABNORMAL HIGH (ref 70–99)
Potassium: 3.8 mmol/L (ref 3.5–5.1)
Sodium: 138 mmol/L (ref 135–145)

## 2020-07-30 MED ORDER — ASPIRIN 81 MG PO TBEC: 81.0000 mg | DELAYED_RELEASE_TABLET | Freq: Every day | ORAL | 11 refills | Status: AC

## 2020-07-30 MED ORDER — METOPROLOL SUCCINATE ER 50 MG PO TB24: 50.0000 mg | ORAL_TABLET | Freq: Every day | ORAL | 11 refills | Status: DC

## 2020-07-30 MED ORDER — CLOPIDOGREL BISULFATE 75 MG PO TABS: 75.0000 mg | ORAL_TABLET | Freq: Every day | ORAL | 5 refills | Status: DC

## 2020-07-30 MED ORDER — COLCHICINE 0.6 MG PO TABS: 0.6000 mg | ORAL_TABLET | Freq: Two times a day (BID) | ORAL | 0 refills | Status: DC

## 2020-07-30 MED ORDER — ISOSORBIDE DINITRATE 10 MG PO TABS: 10.0000 mg | ORAL_TABLET | Freq: Three times a day (TID) | ORAL | 1 refills | Status: DC

## 2020-07-30 MED ORDER — TRAMADOL HCL 50 MG PO TABS: 50.0000 mg | ORAL_TABLET | ORAL | 0 refills | Status: AC | PRN

## 2020-07-30 MED ORDER — EMPAGLIFLOZIN 10 MG PO TABS
10.0000 mg | ORAL_TABLET | Freq: Every day | ORAL | 5 refills | Status: DC
Start: 2020-07-30 — End: 2020-08-15

## 2020-07-30 MED ORDER — ATORVASTATIN CALCIUM 80 MG PO TABS: 80.0000 mg | ORAL_TABLET | Freq: Every day | ORAL | 2 refills | Status: DC

## 2020-07-30 NOTE — Discharge Instructions (Signed)

## 2020-07-30 NOTE — Progress Notes (Signed)
Discharge instructions given to patient. Dad and girlfriend were present. Telemetry box removed, CCMD notified. PIV removed. Patients belongings packs and taken with patient in wheelchair to vehicle.  Daymon Larsen, RN

## 2020-07-30 NOTE — Progress Notes (Signed)
[address]       [place] [address]             [phone]        3 Days Post-Op Procedure(s) (LRB): CORONARY ARTERY BYPASS GRAFTING (CABG) X THREE, USING LEFT INTERNAL MAMMARY ARTERY< RIGHT INTERNAL MAMMARY ARTERY AND LEFT RADIAL ARTERY CONDUITS. (N/A) TRANSESOPHAGEAL ECHOCARDIOGRAM (TEE) (N/A) INDOCYANINE GREEN FLUORESCENCE IMAGING (ICG) (N/A) RADIAL ARTERY HARVEST (Left) INTERCOSTAL NERVE BLOCK APPLICATION OF CELL SAVER Subjective: Awake and alert no new concerns.  He feels he is ready to return home.    Objective: Vital signs in last 24 hours: Temp:  [98.3 F (36.8 C)-98.8 F (37.1 C)] 98.3 F (36.8 C) (06/25 0753) Pulse Rate:  [96-101] 96 (06/25 0753) Cardiac Rhythm: Normal sinus rhythm (06/25 0700) Resp:  [16-21] 17 (06/25 0753) BP: (102-148)/(51-93) 119/79 (06/25 0753) SpO2:  [93 %-100 %] 96 % (06/25 0753) Weight:  [124.3 kg] 124.3 kg (06/25 0300)   Intake/Output from previous day: 06/24 0701 - 06/25 0700 In: 240 [P.O.:240] Out: 1100 [Urine:1100] Intake/Output this shift: No intake/output data recorded.  General appearance: alert, cooperative, and no distress Neurologic: intact Heart: regular rhythm, monitor showing sinus rhythm at 95-100/mn. Lungs: clear to auscultation bilaterally Abdomen: soft, NT Extremities: no edema, the left radial incision is intact and dry. Has some numbness in his left thumb that he says was present before this admission. This is unchanged from yesterday. The left hand is warm and well perfused. Wound: the sternal incisions and left arm incision are intact and dry.   Lab Results: Recent Labs    07/27/20 1715 07/28/20 0519  WBC 13.2* 10.0  HGB 13.1 12.4*  HCT 39.9 36.4*  PLT 156 125*    BMET:  Recent Labs    07/29/20 0119 07/30/20 0121  NA 135 138  K 3.9 3.8  CL 99 105  CO2 27 26  GLUCOSE 111* 128*  BUN 20 19  CREATININE 1.46* 1.46*  CALCIUM 8.5* 8.5*     PT/INR:  No results for  input(s): LABPROT, INR in the last 72 hours.  ABG    Component Value Date/Time   PHART 7.325 (L) 07/26/2020 1848   HCO3 21.0 07/26/2020 1848   TCO2 22 07/26/2020 1848   ACIDBASEDEF 5.0 (H) 07/26/2020 1848   O2SAT 63.4 07/28/2020 0542   CBG (last 3)  Recent Labs    07/28/20 1632 07/28/20 2141 07/29/20 0623  GLUCAP 121* 106* 119*     CLINICAL DATA:  Post CABG.   EXAM: CHEST - 2 VIEW   COMPARISON:  07/28/2020   FINDINGS: Right jugular central line has been removed. Chest drains have been removed. No significant pneumothorax. Again noted are linear and bandlike densities in the upper lungs that are most compatible with atelectasis. Slight elevation of the right hemidiaphragm. Patient has median sternotomy wires. Heart size is stable. The trachea is midline. No significant pleural fluid.   IMPRESSION: 1. Removal of chest drains.  Negative for pneumothorax. 2. Persistent densities in the upper lungs that are most compatible with atelectasis.     Electronically Signed   By: Markus Daft M.D.   On: 07/29/2020 08:49   Assessment/Plan: S/P Procedure(s) (LRB): CORONARY ARTERY BYPASS GRAFTING (CABG) X THREE, USING LEFT INTERNAL MAMMARY ARTERY< RIGHT INTERNAL MAMMARY ARTERY AND LEFT RADIAL ARTERY CONDUITS. (N/A) TRANSESOPHAGEAL ECHOCARDIOGRAM (TEE) (N/A) INDOCYANINE GREEN FLUORESCENCE IMAGING (ICG) (N/A) RADIAL ARTERY HARVEST (Left) INTERCOSTAL NERVE BLOCK APPLICATION OF CELL SAVER  -POD4 CABG x  3, EF 30-35% pre-op. Pt presented with acute NSTEMI. Progressing well with mobility. Continue ASA, statin, and oral nitrate for arterial graft patency.  Added Plavix since he presented with acute coronary syndrome and will start Jardiance per cardiology recs.   -Volume excess- resolved.   -Mild expected acute blood loss anemia- stable, no indication for intervention.   -Disposition- discharge to home today. Instructions given.    LOS: 9 days    Roberto Morrison,  [place] [phone] 07/30/2020

## 2020-08-01 ENCOUNTER — Other Ambulatory Visit: Payer: Self-pay | Admitting: Cardiothoracic Surgery

## 2020-08-01 DIAGNOSIS — Z951 Presence of aortocoronary bypass graft: Secondary | ICD-10-CM

## 2020-08-02 ENCOUNTER — Ambulatory Visit
Admission: RE | Admit: 2020-08-02 | Discharge: 2020-08-02 | Disposition: A | Discharge status: Home / Self Care | Payer: 59 | Source: Ambulatory Visit | Attending: Cardiothoracic Surgery | Admitting: Cardiothoracic Surgery

## 2020-08-02 ENCOUNTER — Ambulatory Visit (INDEPENDENT_AMBULATORY_CARE_PROVIDER_SITE_OTHER): Payer: Self-pay | Admitting: Physician Assistant

## 2020-08-02 ENCOUNTER — Other Ambulatory Visit: Payer: Self-pay

## 2020-08-02 VITALS — BP 122/81 | HR 100 | Resp 20 | Ht 72.0 in | Wt 266.0 lb

## 2020-08-02 DIAGNOSIS — Z951 Presence of aortocoronary bypass graft: Secondary | ICD-10-CM

## 2020-08-02 NOTE — Patient Instructions (Signed)
-  OK to travel.   -No driving until cleared by Dr. Orvan Seen -Continue to observe sternal precautions -Change in medications

## 2020-08-02 NOTE — Progress Notes (Signed)
  HPI:  Patient returns for routine postoperative follow-up having undergone CABG x3 by Dr. Orvan Seen on 07/26/2020 using all arterial grafts.  He presented with acute non-ST elevation myocardial infarction and preoperative echocardiogram gram demonstrated left ventricular ejection fraction of 30 to 35%.  His postoperative course was uneventful.  Since hospital discharge the patient reports has continued to progress.  He is requiring minimal pain medicine.   Denies shortness of breath.  He is tolerating his medications as prescribed.   Current Outpatient Medications  Medication Sig Dispense Refill   aspirin EC 81 MG EC tablet Take 1 tablet (81 mg total) by mouth daily. Swallow whole. 30 tablet 11   atorvastatin (LIPITOR) 80 MG tablet Take 1 tablet (80 mg total) by mouth daily. 30 tablet 2   clopidogrel (PLAVIX) 75 MG tablet Take 1 tablet (75 mg total) by mouth daily. 30 tablet 5   colchicine 0.6 MG tablet Take 1 tablet (0.6 mg total) by mouth 2 (two) times daily. 30 tablet 0   empagliflozin (JARDIANCE) 10 MG TABS tablet Take 1 tablet (10 mg total) by mouth daily before breakfast. 30 tablet 5   isosorbide dinitrate (ISORDIL) 10 MG tablet Take 1 tablet (10 mg total) by mouth 3 (three) times daily. 60 tablet 1   metoprolol succinate (TOPROL XL) 50 MG 24 hr tablet Take 1 tablet (50 mg total) by mouth daily. Take with or immediately following a meal. 30 tablet 11   traMADol (ULTRAM) 50 MG tablet Take 1-2 tablets (50-100 mg total) by mouth every 4 (four) hours as needed for up to 5 days for moderate pain. 30 tablet 0   No current facility-administered medications for this visit.    Physical Exam BP  122/81 Pulse  100 Respirations 20 O2 sat  96% on room air  Heart -regular rate and rhythm  Chest-breath sounds are clear.  Chest tube site sutures are removed today.  The incision is healing with no sign of complication.  Extremities-all well perfused.  The left arm incision is healing.  There is  some mild erythema along the edges incision with local suture reaction.  There is no drainage or induration.  Diagnostic Tests: Chest x-ray performed today has not yet been read. Lung fields are clear, there is no significant effusion.  No pneumothorax.  The film appears underpenetrated.   Impression / Plan: Roberto Morrison is making satisfactory progress 1 week status post CABG x3 with all arterial grafts.  He would like to travel out of town to [facility] for a few days this weekend.  I see no reason why he cannot safely make this trip.  Medications are reviewed and no changes are necessary from our standpoint.  He does not need any refills today.  He is not requiring much the pain medicine.  Sternal precautions were carefully reviewed. Roberto Morrison has scheduled follow-up with Dr. Orvan Seen next week.   Antony Odea, PA-C [facility] [phone]

## 2020-08-03 ENCOUNTER — Other Ambulatory Visit: Payer: Self-pay | Admitting: Cardiothoracic Surgery

## 2020-08-03 ENCOUNTER — Other Ambulatory Visit: Payer: Self-pay | Admitting: Thoracic Surgery (Cardiothoracic Vascular Surgery)

## 2020-08-03 DIAGNOSIS — Z951 Presence of aortocoronary bypass graft: Secondary | ICD-10-CM

## 2020-08-05 ENCOUNTER — Telehealth (HOSPITAL_COMMUNITY): Payer: Self-pay

## 2020-08-05 NOTE — Telephone Encounter (Signed)
Per phase I cardiac rehab, fax cardiac rehab referral to [facility]. 

## 2020-08-09 ENCOUNTER — Ambulatory Visit (INDEPENDENT_AMBULATORY_CARE_PROVIDER_SITE_OTHER): Payer: Self-pay | Admitting: Cardiothoracic Surgery

## 2020-08-09 ENCOUNTER — Ambulatory Visit
Admission: RE | Admit: 2020-08-09 | Discharge: 2020-08-09 | Disposition: A | Discharge status: Home / Self Care | Payer: 59 | Source: Ambulatory Visit | Attending: Cardiothoracic Surgery | Admitting: Cardiothoracic Surgery

## 2020-08-09 ENCOUNTER — Encounter: Payer: Self-pay | Admitting: Cardiothoracic Surgery

## 2020-08-09 ENCOUNTER — Other Ambulatory Visit: Payer: Self-pay

## 2020-08-09 VITALS — BP 120/77 | HR 96 | Resp 20 | Ht 72.0 in | Wt 266.6 lb

## 2020-08-09 DIAGNOSIS — Z951 Presence of aortocoronary bypass graft: Secondary | ICD-10-CM

## 2020-08-11 NOTE — Progress Notes (Signed)
      [address]       [place] [address]             [phone]     CARDIOTHORACIC SURGERY OFFICE NOTE  Referring Provider is Lorretta Harp, MD Primary Cardiologist is Quay Burow, MD PCP is Patient, No Pcp Per (Inactive)   HPI:  50 yo man underwent CABG approximately 2 weeks ago. He has done very well and is back to routine activity. Denies CP or SOB/palpitations.    Current Outpatient Medications  Medication Sig Dispense Refill   aspirin EC 81 MG EC tablet Take 1 tablet (81 mg total) by mouth daily. Swallow whole. 30 tablet 11   atorvastatin (LIPITOR) 80 MG tablet Take 1 tablet (80 mg total) by mouth daily. 30 tablet 2   clopidogrel (PLAVIX) 75 MG tablet Take 1 tablet (75 mg total) by mouth daily. 30 tablet 5   empagliflozin (JARDIANCE) 10 MG TABS tablet Take 1 tablet (10 mg total) by mouth daily before breakfast. 30 tablet 5   metoprolol succinate (TOPROL XL) 50 MG 24 hr tablet Take 1 tablet (50 mg total) by mouth daily. Take with or immediately following a meal. 30 tablet 11   No current facility-administered medications for this visit.      Physical Exam:   BP 120/77 (BP Location: Right Arm, Patient Position: Sitting, Cuff Size: Large)   Pulse 96   Resp 20   Ht 6' (1.829 m)   Wt 120.9 kg   SpO2 95% Comment: RA  BMI 36.16 kg/m   General:  Well appearing, NAD  Chest:   cta  CV:   rrr  Incisions:  Healing well  Abdomen:  sntnd  Extremities:  Minimal edema  Diagnostic Tests:  CXR with clear lung fields   Impression:  Doing well after CABG  Plan:  F/u as needed with thoracic surgery  I spent in excess of 15 minutes during the conduct of this office consultation and >50% of this time involved direct face-to-face encounter with the patient for counseling and/or coordination of their care.  Level 2                 10 minutes Level 3                 15 minutes Level 4                 25 minutes Level 5                 40  minutes  B. Murvin Natal, MD 08/11/2020 11:48 AM

## 2020-08-15 ENCOUNTER — Ambulatory Visit (HOSPITAL_BASED_OUTPATIENT_CLINIC_OR_DEPARTMENT_OTHER): Payer: 59 | Admitting: Family

## 2020-08-15 ENCOUNTER — Encounter (HOSPITAL_BASED_OUTPATIENT_CLINIC_OR_DEPARTMENT_OTHER): Payer: Self-pay | Admitting: Family

## 2020-08-15 ENCOUNTER — Other Ambulatory Visit: Payer: Self-pay

## 2020-08-15 VITALS — BP 120/70 | HR 97 | Ht 72.0 in | Wt 268.0 lb

## 2020-08-15 DIAGNOSIS — I255 Ischemic cardiomyopathy: Secondary | ICD-10-CM

## 2020-08-15 DIAGNOSIS — E785 Hyperlipidemia, unspecified: Secondary | ICD-10-CM | POA: Diagnosis not present

## 2020-08-15 DIAGNOSIS — Z951 Presence of aortocoronary bypass graft: Secondary | ICD-10-CM

## 2020-08-15 DIAGNOSIS — I25118 Atherosclerotic heart disease of native coronary artery with other forms of angina pectoris: Secondary | ICD-10-CM

## 2020-08-15 MED ORDER — CLOPIDOGREL BISULFATE 75 MG PO TABS: 75.0000 mg | ORAL_TABLET | Freq: Every day | ORAL | 3 refills | Status: DC

## 2020-08-15 MED ORDER — METOPROLOL SUCCINATE ER 50 MG PO TB24: 50.0000 mg | ORAL_TABLET | Freq: Every day | ORAL | 3 refills | Status: DC

## 2020-08-15 MED ORDER — ATORVASTATIN CALCIUM 80 MG PO TABS: 80.0000 mg | ORAL_TABLET | Freq: Every day | ORAL | 1 refills | Status: DC

## 2020-08-15 MED ORDER — EMPAGLIFLOZIN 10 MG PO TABS: 10.0000 mg | ORAL_TABLET | Freq: Every day | ORAL | 1 refills | Status: DC

## 2020-08-15 NOTE — Patient Instructions (Addendum)
Medication Instructions:  Continue your current medications.   *If you need a refill on your cardiac medications before your next appointment, please call your pharmacy*   Lab Work: Your physician recommends that you return for lab work in: 4 weeks for fasting lipid panel, CMP, CBC   If you have labs (blood work) drawn today and your tests are completely normal, you will receive your results only by: [place] (if you have MyChart) OR A paper copy in the mail If you have any lab test that is abnormal or we need to change your treatment, we will call you to review the results.   Testing/Procedures: Your EKG today was stable compared to previous.   Your physician has requested that you have an echocardiogram 10/2020 for monitoring of your heart pumping function. Echocardiography is a painless test that uses sound waves to create images of your heart. It provides your doctor with information about the size and shape of your heart and how well your heart's chambers and valves are working. This procedure takes approximately one hour. There are no restrictions for this procedure.    Follow-Up: At [facility], you and your health needs are our priority.  As part of our continuing mission to provide you with exceptional heart care, we have created designated Provider Care Teams.  These Care Teams include your primary Cardiologist (physician) and Advanced Practice Providers (APPs -  Physician Assistants and Nurse Practitioners) who all work together to provide you with the care you need, when you need it.  We recommend signing up for the patient portal called "MyChart".  Sign up information is provided on this After Visit Summary.  MyChart is used to connect with patients for Virtual Visits (Telemedicine).  Patients are able to view lab/test results, encounter notes, upcoming appointments, etc.  Non-urgent messages can be sent to your provider as well.   To learn more about what you can do  with MyChart, go to [email or web address].    Your next appointment:   After echocardiogram on Tuesday, September 13 @ 9:30 am.   The format for your next appointment:   In Person on Tuesday, October 4 @ 9:30 am.  Provider:   You may see Quay Burow, MD or one of the following Advanced Practice Providers on your designated Care Team:     Other Instructions  Heart Healthy Diet Recommendations: A low-salt diet is recommended. Meats should be grilled, baked, or boiled. Avoid fried foods. Focus on lean protein sources like fish or chicken with vegetables and fruits. The American Heart Association is a Microbiologist!  American Heart Association Diet and Lifeystyle Recommendations    Exercise recommendations: The American Heart Association recommends 150 minutes of moderate intensity exercise weekly. Try 30 minutes of moderate intensity exercise 4-5 times per week. This could include walking, jogging, or swimming.  For coronary artery disease often called "heart disease" we aim for optimal guideline directed medical therapy. We use the "A, B, C"s to help keep [place] on track!  A = Aspirin 81mg  daily B = Beta blocker which helps to relax the heart. This is your Metoprolol. C = Cholesterol control. You take Atorvastatin to help control your cholesterol.  D = Don't forget nitroglycerin! This is an emergency tablet to be used if you have chest pain. E = Extras. In your case, this is Plavix for at least 1 year after surgery to prevent your bypass grafts

## 2020-08-15 NOTE — Progress Notes (Signed)
Office Visit    Patient Name: Roberto Morrison Date of Encounter: 08/15/2020  PCP:  Patient, No Pcp Per (Inactive)   [place]  Cardiologist:  Quay Burow, MD  Advanced Practice Provider:  No care team member to display Electrophysiologist:  None    Chief Complaint    Roberto Morrison is a 50 y.o. male with a hx of CAD s/p CABG 07/2020, hyperlipidemia, ischemic cardiomyopathy presents today for hospital follow up   Past Medical History    No past medical history on file. Past Surgical History:  Procedure Laterality Date   CORONARY ARTERY BYPASS GRAFT N/A 07/26/2020   Procedure: CORONARY ARTERY BYPASS GRAFTING (CABG) X THREE, USING LEFT INTERNAL MAMMARY ARTERY< RIGHT INTERNAL MAMMARY ARTERY AND LEFT RADIAL ARTERY CONDUITS.;  Surgeon: Wonda Olds, MD;  Location: [facility];  Service: Open Heart Surgery;  Laterality: N/A;   INTERCOSTAL NERVE BLOCK  07/26/2020   Procedure: INTERCOSTAL NERVE BLOCK;  Surgeon: Wonda Olds, MD;  Location: [facility];  Service: Open Heart Surgery;;   LEFT HEART CATH AND CORONARY ANGIOGRAPHY N/A 07/22/2020   Procedure: LEFT HEART CATH AND CORONARY ANGIOGRAPHY;  Surgeon: Lorretta Harp, MD;  Location: [facility];  Service: Cardiovascular;  Laterality: N/A;   RADIAL ARTERY HARVEST Left 07/26/2020   Procedure: RADIAL ARTERY HARVEST;  Surgeon: Wonda Olds, MD;  Location: [facility];  Service: Open Heart Surgery;  Laterality: Left;   TEE WITHOUT CARDIOVERSION N/A 07/26/2020   Procedure: TRANSESOPHAGEAL ECHOCARDIOGRAM (TEE);  Surgeon: Wonda Olds, MD;  Location: [facility];  Service: Open Heart Surgery;  Laterality: N/A;    Allergies  Allergies  Allergen Reactions   Prednisone Other (See Comments)    "Hiccups"     History of Present Illness    Roberto Morrison is a 50 y.o. male with a hx of CAD s/p CABGx3 07/2020, hyperlipidemia, ischemic cardiomyopathy last seen while hospitalized.  He presented to the ED 07/21/2020 with  chest pain which occurred while exercising.  He developed heart cath showing severe CAD in the LAD and LCx with borderline disease of the RCA.  Echocardiogram with LVEF 30 to 35%.  He underwent CABGx3 on 07/26/2020 (LIMA-LAD, pedicled RIMA PDA, left radial artery graft to obtuse Marginal branch of LCx).  He was prescribed oral nitrates to optimize arterial graft patency.His postoperative course was overall unremarkable.  He presents today for follow up.  He notes no exertional chest discomfort.  Notes some soreness at previous incision site as well as some chest discomfort when laying on his side which resolves by lying on his back.  He has been seen and followed by cardiothoracic surgery and cleared to follow-up as needed.  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  He has returned to some gym activities without exertional dyspnea but has maintain sternal precautions.   EKGs/Labs/Other Studies Reviewed:   The following studies were reviewed today:  Echo: 07/22/20   IMPRESSIONS     1. Left ventricular ejection fraction, by estimation, is 30 to 35%. The  left ventricle has moderately decreased function. The left ventricle  demonstrates regional wall motion abnormalities (see scoring  diagram/findings for description). Left ventricular   diastolic parameters are consistent with Grade I diastolic dysfunction  (impaired relaxation). There is severe hypokinesis of the left  ventricular, mid-apical anteroseptal wall.   2. Right ventricular systolic function is normal. The right ventricular  size is  normal.   3. The mitral valve is normal in structure. Trivial mitral valve  regurgitation. No evidence of mitral stenosis.   4. The aortic valve is normal in structure. Aortic valve regurgitation is  not visualized. No aortic stenosis is present.   Cath: 07/22/20   IMPRESSION: Mr. Deman was admitted with a non-STEMI.  He has three-vessel  disease.  He has a long segmental mid LAD lesion and a moderately long mid AV groove circumflex lesion lesion with what appears to be a moderate though not significant distal RCA lesion.  His filling pressures were normal.  His EF looked preserved.  I believe he would be best served with coronary artery bypass grafting with bilateral IMA's.  Surgical consult has been called.  The sheath was removed and a TR band was placed on the right wrist to achieve patent hemostasis.  The patient left the lab in stable condition.  Plan will be to restart IV heparin 4 hours after sheath removal.   Quay Burow. MD, [facility] 07/22/2020 4:11 PM   Diagnostic Dominance: Right       EKG:  EKG is ordered today.  The ekg ordered today demonstrates NSR 97 bpm with LAFB and prolonged QT (QT 392, QTc 497). No acute ST/T wave changes.   Recent Labs: 07/26/2020: ALT 30 07/27/2020: Magnesium 2.2 07/28/2020: Hemoglobin 12.4; Platelets 125 07/30/2020: BUN 19; Creatinine, Ser 1.46; Potassium 3.8; Sodium 138  Recent Lipid Panel    Component Value Date/Time   CHOL 171 07/25/2020 0928   TRIG 71 07/25/2020 0928   HDL 32 (L) 07/25/2020 0928   CHOLHDL 5.3 07/25/2020 0928   VLDL 14 07/25/2020 0928   LDLCALC 125 (H) 07/25/2020 0928   Home Medications   Current Meds  Medication Sig   aspirin EC 81 MG EC tablet Take 1 tablet (81 mg total) by mouth daily. Swallow whole.   atorvastatin (LIPITOR) 80 MG tablet Take 1 tablet (80 mg total) by mouth daily.   clopidogrel (PLAVIX) 75 MG tablet Take 1 tablet (75 mg total) by mouth daily.   empagliflozin (JARDIANCE) 10 MG TABS tablet Take 1 tablet (10 mg total) by mouth daily before breakfast.   metoprolol succinate (TOPROL XL) 50 MG 24 hr tablet Take 1 tablet (50 mg total) by mouth daily. Take with or immediately following a meal.     Review of Systems      All other systems reviewed and are otherwise negative except as noted above.  Physical Exam    VS:  BP 120/70   Pulse 97    Ht 6' (1.829 m)   Wt 268 lb (121.6 kg)   SpO2 98%   BMI 36.35 kg/m  , BMI Body mass index is 36.35 kg/m.  Wt Readings from Last 3 Encounters:  08/15/20 268 lb (121.6 kg)  08/09/20 266 lb 9.6 oz (120.9 kg)  08/02/20 266 lb (120.7 kg)    GEN: Well nourished, well developed, in no acute distress. HEENT: normal. Neck: Supple, no JVD, carotid bruits, or masses. Cardiac: RRR, no murmurs, rubs, or gallops. No clubbing, cyanosis, edema.  Radials/PT 2+ and equal bilaterally.  Respiratory:  Respirations regular and unlabored, clear to auscultation bilaterally. GI: Soft, nontender, nondistended. MS: No deformity or atrophy. Skin: Warm and dry, no rash.  Midsternal incision and left arm incision is healing appropriately. Neuro:  Strength and sensation are intact. Psych: Normal affect.  Assessment & Plan    CAD s/p CABG - Stable with no anginal symptoms. No indication for  ischemic evaluation.  Incisions healing appropriately.  GDMT includes aspirin, clopidogrel, metoprolol, atorvastatin.  Refills provided. Discussed indication for clopidogrel for at least 1 year after CABG. Heart healthy diet and regular cardiovascular exercise encouraged.    HLD, LDl goal <70 -atorvastatin 80 mg daily started while in hospital.  Plan for repeat fasting lipid panel and c-Met in 4 weeks as he will have completed 8 weeks of lipid-lowering therapy.  If LDL not at goal of less than 70 plan to add Zetia  HFrEF / ICM -prior to CABG LVEF 30 to 35%.  Intraoperative TEE with LVEF 40 to 45%.  GDMT includes Toprol, Jardiance.  Blood pressure will not allow for addition of ACE/ARB/ARNI. Euvolemic on exam, defer addition of loop diuretic. Will plan for echo in 2 months as he will be 3 months post CABG. If LVEF not normalized, plan to up titrate therapy.   Disposition: Follow up in 3 month(s) with Dr. Gwenlyn Found or APP.  Signed, Loel Dubonnet, NP 08/15/2020, 11:52 AM [place]

## 2020-08-22 ENCOUNTER — Telehealth: Payer: Self-pay

## 2020-08-22 ENCOUNTER — Other Ambulatory Visit: Payer: Self-pay | Admitting: Cardiothoracic Surgery

## 2020-08-22 MED ORDER — ZOLPIDEM TARTRATE ER 12.5 MG PO TBCR: 12.5000 mg | EXTENDED_RELEASE_TABLET | Freq: Every evening | ORAL | 1 refills | Status: DC | PRN

## 2020-08-22 NOTE — Telephone Encounter (Signed)
Patient contacted the office several times before a return call was given requesting a sleep aid, patient was very agitated when called. He is s/p CABG x3 with Dr. Orvan Seen 07/26/20. He stated that when he was here in the office 08/09/20 Dr. Orvan Seen stated that he would give him a prescription for sleep aid. Patient stated that he had Dr. Orvan Seen personal cell number and he would give him a call if he didn't get a response in a timely manner. Also states while on the phone that he was in the car driving up to the office because he did not get a return call when he felt he should have. Advised that Dr. Orvan Seen would be made aware of request and would return his call as soon as possible. Patient acknowledged receipt.

## 2020-08-22 NOTE — Progress Notes (Signed)
Patient called office requesting sleep med. Order entered electronically for ambien prn. Bertrand Vowels Z. Orvan Seen, [place]

## 2020-08-26 ENCOUNTER — Telehealth: Payer: Self-pay | Admitting: Cardiovascular Disease

## 2020-08-26 ENCOUNTER — Other Ambulatory Visit: Payer: Self-pay

## 2020-08-26 ENCOUNTER — Ambulatory Visit (INDEPENDENT_AMBULATORY_CARE_PROVIDER_SITE_OTHER): Payer: Self-pay | Admitting: *Deleted

## 2020-08-26 DIAGNOSIS — Z4802 Encounter for removal of sutures: Secondary | ICD-10-CM

## 2020-08-26 DIAGNOSIS — Z5189 Encounter for other specified aftercare: Secondary | ICD-10-CM

## 2020-08-26 MED ORDER — CEPHALEXIN 500 MG PO CAPS: 500.0000 mg | ORAL_CAPSULE | Freq: Two times a day (BID) | ORAL | 0 refills | Status: DC

## 2020-08-26 NOTE — Telephone Encounter (Signed)
Patient called to say that he had a procedure done last week. And the scar from that is very sore and if he touches it discharge comes from it. He wants to make sure is not effected any. Please advise.

## 2020-08-26 NOTE — Progress Notes (Signed)
Patient presented for wound check after originally contacting his cardiologist's office with c/o pain, redness, and drainage from distal end of sternal incision. Patient is s/p CABG 6/21 by Dr. Orvan Seen. Upon calling the patient, patient stated he was on the way up to our office so someone could look at the incision. On observation, distal end of incision red in color with dried scab at very end. Patient states he was "mashing" on the incision yesterday as he felt there was an ingrown hair at this area. When doing so the incision produced "minimal thick yellow drainage". Patient denies fever and warmth to site. Very distal end is soft to touch. States pain at area.  Advised patient to not press at area and to clean incision and site with soap and water. Photos taken with patient's permission and sent to T. [place], [place]. Per PA, antibiotics sent in to patient's preferred pharmacy and a follow-up appointment made for early next week. Patient aware and verbalizes understanding.

## 2020-08-26 NOTE — Telephone Encounter (Signed)
Returned call to patient who states that he has been having some pain and redness at the bottom of his sternal incision. Patient states at the very bottom of the sternal incision it is very sore to the touch and if he mashes on it white/yellow drainage comes out. Patient states that the drainage is very minimal. Patient denies any fever or any other symptoms at this time. Patient states he feels like something is poking out of the incision but denies it feeling sharp or wire like. Patient states that only the very bottom is sore/irritated. Advised patient that he should reach out to [facility] regarding his sternal incision but advised patient that I would forward message to Dr. Zenda Alpers as well. Patient verbalized understanding.

## 2020-08-30 ENCOUNTER — Ambulatory Visit: Payer: Self-pay | Admitting: Physician Assistant

## 2020-08-30 ENCOUNTER — Other Ambulatory Visit: Payer: Self-pay

## 2020-08-30 ENCOUNTER — Encounter: Payer: Self-pay | Admitting: Physician Assistant

## 2020-08-30 VITALS — BP 133/86 | HR 98 | Resp 20 | Ht 72.0 in | Wt 270.0 lb

## 2020-08-30 DIAGNOSIS — Z951 Presence of aortocoronary bypass graft: Secondary | ICD-10-CM

## 2020-08-30 MED ORDER — CEPHALEXIN 500 MG PO CAPS: 500.0000 mg | ORAL_CAPSULE | Freq: Two times a day (BID) | ORAL | 0 refills | Status: DC

## 2020-08-30 NOTE — Addendum Note (Signed)
Addended by: Nani Skillern on: 08/30/2020 02:01 PM   Modules accepted: Orders

## 2020-08-30 NOTE — Progress Notes (Addendum)
      [address]       ,[place] [address]             [phone]       CARDIAC SURGERY POSTOPERATIVE VISIT  Patient Name: Roberto Morrison MRN: [id number] DOB: Oct 09, 1970  Subjective: Roberto Morrison is a 50 y.o. male with a past medical history of NSTEMI and s/p CABG x 3 by Dr. Orvan Seen on 07/26/2020. He was last seen by Dr. Orvan Seen on 07/05 and there was no mention of a wound problem. Today, he presents because the lower portion of his sternal wound has become painful, red, and has drainage. He denies fever or chills and states since taking the antibiotic, there is less redness and less pain.  Prior to Admission medications   Medication Sig Start Date End Date Taking? Authorizing Provider  aspirin EC 81 MG EC tablet Take 1 tablet (81 mg total) by mouth daily. Swallow whole. 07/31/20   Antony Odea, PA-C  atorvastatin (LIPITOR) 80 MG tablet Take 1 tablet (80 mg total) by mouth daily. 08/15/20   Loel Dubonnet, NP  cephALEXin (KEFLEX) 500 MG capsule Take 1 capsule (500 mg total) by mouth 2 (two) times daily. 08/26/20   [name], PA-C  clopidogrel (PLAVIX) 75 MG tablet Take 1 tablet (75 mg total) by mouth daily. 08/15/20   Loel Dubonnet, NP  empagliflozin (JARDIANCE) 10 MG TABS tablet Take 1 tablet (10 mg total) by mouth daily before breakfast. 08/15/20   Loel Dubonnet, NP  metoprolol succinate (TOPROL XL) 50 MG 24 hr tablet Take 1 tablet (50 mg total) by mouth daily. Take with or immediately following a meal. 08/15/20 08/15/21  Loel Dubonnet, NP  zolpidem (AMBIEN CR) 12.5 MG CR tablet Take 1 tablet (12.5 mg total) by mouth at bedtime as needed for sleep. 08/22/20 08/22/21  Wonda Olds, MD   Vitals:   08/30/20 1330  BP: 133/86  Pulse: 98  Resp: 20  SpO2: 98%     Physical Exam:  CARDIOVASCULAR: Regular rate and rhythm. No peripheral edema. EXTREMITIES:No LE edema WOUNDS: Left upper arm wound is clean, dry, no sign of infection. Lower portion  of sternal wound has superficial area of erythema, and trace drainage-?suture abscess. No sternal instability noted  Impression/Plan: We discussed continuing Cephalexin through Monday as he will return for further sternal wound evaluation on 08/01. Patient instructed to stop squeezing affected area and he was instructed to cleanse wound with soap and water and apply band aid or dry gauze and tape to lower sternal wound. Hopefully, with continued conservative management, this will resolve. If area gets redder, increased drainage, or he gets fever, he is to call the office asap to be seen.   Lars Pinks, PA-C 08/30/2020 12:17 PM

## 2020-09-05 ENCOUNTER — Other Ambulatory Visit: Payer: Self-pay

## 2020-09-05 ENCOUNTER — Ambulatory Visit (INDEPENDENT_AMBULATORY_CARE_PROVIDER_SITE_OTHER): Payer: Self-pay | Admitting: Physician Assistant

## 2020-09-05 VITALS — BP 140/97 | HR 88 | Resp 20 | Wt 270.0 lb

## 2020-09-05 DIAGNOSIS — Z951 Presence of aortocoronary bypass graft: Secondary | ICD-10-CM

## 2020-09-05 MED ORDER — ZOLPIDEM TARTRATE ER 12.5 MG PO TBCR: 12.5000 mg | EXTENDED_RELEASE_TABLET | Freq: Every evening | ORAL | 0 refills | Status: DC | PRN

## 2020-09-05 NOTE — Progress Notes (Signed)
[name] is a 50 y.o. 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 Orvan Seen. He had a radial artery harvest. He was discharged on asa/plavix. He did follow-up with Cardiology on 7/11 and an echocardiogram is recommended for next month. He saw a PA in our office last week and had some sternal drainage and redness around the lower portion of his sternal incision. He is following up today s/p Keflex x 4 days and conservative management for a wound check.    1. S/P CABG x 3    No past medical history on file. No past surgical history pertinent negatives on file. Scheduled Meds: Current Outpatient Medications on File Prior to Visit  Medication Sig Dispense Refill   aspirin EC 81 MG EC tablet Take 1 tablet (81 mg total) by mouth daily. Swallow whole. 30 tablet 11   atorvastatin (LIPITOR) 80 MG tablet Take 1 tablet (80 mg total) by mouth daily. 90 tablet 1   cephALEXin (KEFLEX) 500 MG capsule Take 1 capsule (500 mg total) by mouth 2 (two) times daily. 8 capsule 0   clopidogrel (PLAVIX) 75 MG tablet Take 1 tablet (75 mg total) by mouth daily. 90 tablet 3   empagliflozin (JARDIANCE) 10 MG TABS tablet Take 1 tablet (10 mg total) by mouth daily before breakfast. 90 tablet 1   metoprolol succinate (TOPROL XL) 50 MG 24 hr tablet Take 1 tablet (50 mg total) by mouth daily. Take with or immediately following a meal. 90 tablet 3   zolpidem (AMBIEN CR) 12.5 MG CR tablet Take 1 tablet (12.5 mg total) by mouth at bedtime as needed for sleep. 30 tablet 1   No current facility-administered medications on file prior to visit.     Allergies  Allergen Reactions   Prednisone Other (See Comments)    "Hiccups"    Active Problems:   * No active hospital problems. *  Blood pressure (!) 140/97, pulse 88, resp. rate 20, weight 270 lb (122.5 kg), SpO2 98 %.  Subjective Objective: Vital signs (most recent): Blood  pressure (!) 140/97, pulse 88, resp. rate 20, weight 270 lb (122.5 kg), SpO2 98 %. Assessment & Plan  Roberto Morrison is a 50 year old male patient status post coronary bypass grafting x3 on 6/21 with Dr. Julien Girt.  He did have radial artery harvesting.  He was discharged on Plavix and aspirin which he will continue for 1 year.  He saw cardiology on 7/11 and is set up for an echocardiogram next month.  On 7/26 he came to the office with some wound issues.  He was having some drainage from the lower portion of his sternum and he was started on Keflex for 4 days.  Today, he returns to the office for a wound check.  Overall, his wound is healing well.  There is a small scab over the lower portion of his incision.  He has no pain anymore.  He does state that he is back at the gym every day.  I encouraged him to continue his sternal precautions and slowly increase his weight over time.  He is not to lift heavy until he is at the 30-monthmark after surgery.  I do not think we need to see him again for his wound but I encouraged him to continue  his antibiotics until the bottle is empty.  I will provide 1 refill of Ambien since it really does work for him and he does not have any side effects.  I did explain to him that this medication is habit-forming and to wean himself off of it over the next days to weeks.  In the future I encouraged him to get refills from his primary care provider since we do not specialize in sleep medicine.  It seems that he has tried Benadryl, melatonin, magnesium, and other sleep aids without success.  I also encouraged him to take his blood pressure at home since it was little bit elevated in the office today.  It was normal during his cardiology visit.  He plans to invest in a wrist blood pressure cuff to take his blood pressure at home.  No routine follow-up suggested at this time.  If he has other issues he is to follow-up in the clinic with [place] or call.  [name] 09/05/2020

## 2020-09-07 ENCOUNTER — Other Ambulatory Visit: Payer: Self-pay | Admitting: *Deleted

## 2020-09-07 ENCOUNTER — Other Ambulatory Visit: Payer: Self-pay | Admitting: Physician Assistant

## 2020-09-07 MED ORDER — ZOLPIDEM TARTRATE ER 12.5 MG PO TBCR: 12.5000 mg | EXTENDED_RELEASE_TABLET | Freq: Every evening | ORAL | 0 refills | Status: DC | PRN

## 2020-09-07 NOTE — Progress Notes (Signed)
      [address]       [place] [address]             [phone]         Re-sent one time refill of Ambien for the patient since pharmacy never received it.  Nicholes Rough, PA-C

## 2020-09-12 LAB — LIPID PANEL
Chol/HDL Ratio: 3.4 ratio (ref 0.0–5.0)
Cholesterol, Total: 113 mg/dL (ref 100–199)
HDL: 33 mg/dL — ABNORMAL LOW (ref >39)
LDL Chol Calc (NIH): 61 mg/dL (ref 0–99)
Triglycerides: 99 mg/dL (ref 0–149)
VLDL Cholesterol Cal: 19 mg/dL (ref 5–40)

## 2020-09-12 LAB — CBC
Hematocrit: 50.3 % (ref 37.5–51.0)
Hemoglobin: 16.5 g/dL (ref 13.0–17.7)
MCH: 30.4 pg (ref 26.6–33.0)
MCHC: 32.8 g/dL (ref 31.5–35.7)
MCV: 93 fL (ref 79–97)
Platelets: 216 10*3/uL (ref 150–450)
RBC: 5.42 x10E6/uL (ref 4.14–5.80)
RDW: 13.8 % (ref 11.6–15.4)
WBC: 6.6 10*3/uL (ref 3.4–10.8)

## 2020-09-12 LAB — COMPREHENSIVE METABOLIC PANEL
ALT: 35 IU/L (ref 0–44)
AST: 37 IU/L (ref 0–40)
Albumin/Globulin Ratio: 1.7 (ref 1.2–2.2)
Albumin: 4 g/dL (ref 4.0–5.0)
Alkaline Phosphatase: 76 IU/L (ref 44–121)
BUN/Creatinine Ratio: 10 (ref 9–20)
BUN: 13 mg/dL (ref 6–24)
Bilirubin Total: 0.7 mg/dL (ref 0.0–1.2)
CO2: 23 mmol/L (ref 20–29)
Calcium: 9.3 mg/dL (ref 8.7–10.2)
Chloride: 102 mmol/L (ref 96–106)
Creatinine, Ser: 1.34 mg/dL — ABNORMAL HIGH (ref 0.76–1.27)
Globulin, Total: 2.3 g/dL (ref 1.5–4.5)
Glucose: 92 mg/dL (ref 65–99)
Potassium: 4.2 mmol/L (ref 3.5–5.2)
Sodium: 139 mmol/L (ref 134–144)
Total Protein: 6.3 g/dL (ref 6.0–8.5)
eGFR: 65 mL/min/{1.73_m2} (ref >59)

## 2020-09-16 ENCOUNTER — Other Ambulatory Visit: Payer: Self-pay

## 2020-09-16 DIAGNOSIS — Z951 Presence of aortocoronary bypass graft: Secondary | ICD-10-CM

## 2020-09-16 DIAGNOSIS — T8130XA Disruption of wound, unspecified, initial encounter: Secondary | ICD-10-CM

## 2020-09-19 ENCOUNTER — Encounter: Payer: Self-pay | Admitting: Physician Assistant

## 2020-09-19 ENCOUNTER — Ambulatory Visit (INDEPENDENT_AMBULATORY_CARE_PROVIDER_SITE_OTHER): Payer: Self-pay | Admitting: Physician Assistant

## 2020-09-19 ENCOUNTER — Other Ambulatory Visit: Payer: Self-pay

## 2020-09-19 VITALS — BP 146/84 | HR 88 | Temp 97.7°F | Resp 20 | Ht 72.0 in

## 2020-09-19 DIAGNOSIS — S21101A Unspecified open wound of right front wall of thorax without penetration into thoracic cavity, initial encounter: Secondary | ICD-10-CM

## 2020-09-19 DIAGNOSIS — L089 Local infection of the skin and subcutaneous tissue, unspecified: Secondary | ICD-10-CM

## 2020-09-19 DIAGNOSIS — Z5189 Encounter for other specified aftercare: Secondary | ICD-10-CM

## 2020-09-19 DIAGNOSIS — Z951 Presence of aortocoronary bypass graft: Secondary | ICD-10-CM

## 2020-09-19 NOTE — Progress Notes (Signed)
  HPI:  Patient returns for routine postoperative follow-up having undergone CABG x3 by Dr. Julien Girt on 07/26/2020 using bilateral internal mammary artery grafts and a left radial artery graft after presenting with acute non-ST elevation myocardial infarction.  The patient's early postoperative recovery while in the hospital was uncomplicated. He was seen in our office for scheduled follow-up on 08/02/2020 and 08/09/2020 and at the time of those visits had no evidence of any wound problems.  He contacted our office on 7/22 after being seen by the cardiology team in their office where he was noted to have some redness and drainage from the lower end of his sternal incision.  He reported that he been able to express some thick yellow drainage from the incision on the day previous.  He was started on oral antibiotics and was seen in follow-up 4 days later by Ms. Tacy Dura, PA-C.  At that time, Roberto Morrison reported that he had been taking the antibiotics as prescribed and denied having any fever or chills.  He also noted the pain in the incision had resolved.  On 8/12, he contacted Ms. Harriet Pho, PA-C who is on-call for our service to report he had more swelling, erythema, and drainage from the lower sternal wound. He was in [place] at the time.  He said he used a sterile needle to puncture a raised area at the lower end of the sternal incision and he was able to express some yellow fluid. We arranged to see him in our office today. He denies any fever, chills, or any sensation of movement in his sternum.   Current Outpatient Medications  Medication Sig Dispense Refill   aspirin EC 81 MG EC tablet Take 1 tablet (81 mg total) by mouth daily. Swallow whole. 30 tablet 11   atorvastatin (LIPITOR) 80 MG tablet Take 1 tablet (80 mg total) by mouth daily. 90 tablet 1   cephALEXin (KEFLEX) 500 MG capsule Take 1 capsule (500 mg total) by mouth 2 (two) times daily. 8 capsule 0   clopidogrel (PLAVIX) 75 MG tablet Take 1 tablet  (75 mg total) by mouth daily. 90 tablet 3   empagliflozin (JARDIANCE) 10 MG TABS tablet Take 1 tablet (10 mg total) by mouth daily before breakfast. 90 tablet 1   metoprolol succinate (TOPROL XL) 50 MG 24 hr tablet Take 1 tablet (50 mg total) by mouth daily. Take with or immediately following a meal. 90 tablet 3   zolpidem (AMBIEN CR) 12.5 MG CR tablet Take 1 tablet (12.5 mg total) by mouth at bedtime as needed for sleep. 15 tablet 0   No current facility-administered medications for this visit.    Physical Exam  BP- 146/84 T-97.7 HR-88 SaO2 97%  General- appears well, no distress Heart-RRR Chest- breath sounds clear Wound- the sternal incision is wall heal along its entire course except for the lower 3-4cm. At this level, there is erythema and fluctuance over the incision along with mild tenderness. There is currently no drainage. The body of the sternum is stable.  Diagnostic Tests: None today.   Impression / Plan:  Situation and findings discussed with Dr. Kipp Brood.  He recommended referral to Dr. Marla Roe, plastic surgery,  For incision and drainage and wound management. This plan was offered to Mr. Jervis and he agrees to proceed.  A referral will be made this afternoon.    Antony Odea, PA-C [facility] [phone]

## 2020-09-19 NOTE — Patient Instructions (Signed)
Referral made to Plastic Surgery, Dr. Audelia Hives.

## 2020-09-20 ENCOUNTER — Ambulatory Visit (INDEPENDENT_AMBULATORY_CARE_PROVIDER_SITE_OTHER): Payer: Self-pay

## 2020-09-20 ENCOUNTER — Other Ambulatory Visit: Payer: Self-pay

## 2020-09-20 ENCOUNTER — Other Ambulatory Visit: Payer: Self-pay | Admitting: Surgery

## 2020-09-20 ENCOUNTER — Ambulatory Visit (INDEPENDENT_AMBULATORY_CARE_PROVIDER_SITE_OTHER): Payer: 59 | Admitting: Plastic Surgery

## 2020-09-20 ENCOUNTER — Ambulatory Visit
Admission: RE | Admit: 2020-09-20 | Discharge: 2020-09-20 | Disposition: A | Discharge status: Home / Self Care | Payer: 59 | Source: Ambulatory Visit | Attending: Physician Assistant | Admitting: Physician Assistant

## 2020-09-20 ENCOUNTER — Encounter: Payer: Self-pay | Admitting: Plastic Surgery

## 2020-09-20 VITALS — BP 138/97 | HR 103 | Ht 72.0 in | Wt 268.4 lb

## 2020-09-20 DIAGNOSIS — Z951 Presence of aortocoronary bypass graft: Secondary | ICD-10-CM

## 2020-09-20 DIAGNOSIS — S21109A Unspecified open wound of unspecified front wall of thorax without penetration into thoracic cavity, initial encounter: Secondary | ICD-10-CM

## 2020-09-20 DIAGNOSIS — T8130XA Disruption of wound, unspecified, initial encounter: Secondary | ICD-10-CM

## 2020-09-20 DIAGNOSIS — Z4889 Encounter for other specified surgical aftercare: Secondary | ICD-10-CM

## 2020-09-20 DIAGNOSIS — T8149XA Infection following a procedure, other surgical site, initial encounter: Secondary | ICD-10-CM

## 2020-09-20 MED ORDER — CEPHALEXIN 500 MG PO CAPS: 500.0000 mg | ORAL_CAPSULE | Freq: Two times a day (BID) | ORAL | 0 refills | Status: DC

## 2020-09-20 NOTE — Progress Notes (Signed)
Patient ID: Roberto Morrison, male    DOB: 10-29-1970, 50 y.o.   MRN: [id number]   Chief Complaint  Patient presents with   Advice Only   Skin Problem    The patient is a 50 year old male here for evaluation of his sternal wound.  He underwent a CABG with 3 vessels by Dr. Julien Girt in June.  He had bilateral internal mammary artery grafts with a left radial artery graft.  He had presented with a non-ST elevation MI.  In July he was noted to have some redness of the sternal area at his cardiology visit.  Some yellow drainage was noted.  He was placed on antibiotics and denied any fevers or chills at the time.  He has now been off the antibiotics for several days and this is when he noticed the increase in pain and swelling.  He also stuck a needle in his skin that then started to drain.  He is also been using chamomile tea bags on his skin in that area.  The Q-tip tunneled about 1.5 cm in the superior direction.   Review of Systems  Constitutional: Negative.   HENT: Negative.    Eyes: Negative.   Respiratory: Negative.    Cardiovascular: Negative.   Gastrointestinal: Negative.   Genitourinary: Negative.   Musculoskeletal: Negative.   Skin:  Positive for color change and wound.  Neurological: Negative.   Hematological: Negative.   Psychiatric/Behavioral: Negative.     History reviewed. No pertinent past medical history.  Past Surgical History:  Procedure Laterality Date   CORONARY ARTERY BYPASS GRAFT N/A 07/26/2020   Procedure: CORONARY ARTERY BYPASS GRAFTING (CABG) X THREE, USING LEFT INTERNAL MAMMARY ARTERY< RIGHT INTERNAL MAMMARY ARTERY AND LEFT RADIAL ARTERY CONDUITS.;  Surgeon: Wonda Olds, MD;  Location: [facility];  Service: Open Heart Surgery;  Laterality: N/A;   INTERCOSTAL NERVE BLOCK  07/26/2020   Procedure: INTERCOSTAL NERVE BLOCK;  Surgeon: Wonda Olds, MD;  Location: [facility];  Service: Open Heart Surgery;;   LEFT HEART CATH AND CORONARY ANGIOGRAPHY N/A 07/22/2020    Procedure: LEFT HEART CATH AND CORONARY ANGIOGRAPHY;  Surgeon: Lorretta Harp, MD;  Location: [facility];  Service: Cardiovascular;  Laterality: N/A;   RADIAL ARTERY HARVEST Left 07/26/2020   Procedure: RADIAL ARTERY HARVEST;  Surgeon: Wonda Olds, MD;  Location: [facility];  Service: Open Heart Surgery;  Laterality: Left;   TEE WITHOUT CARDIOVERSION N/A 07/26/2020   Procedure: TRANSESOPHAGEAL ECHOCARDIOGRAM (TEE);  Surgeon: Wonda Olds, MD;  Location: [facility];  Service: Open Heart Surgery;  Laterality: N/A;      Current Outpatient Medications:    aspirin EC 81 MG EC tablet, Take 1 tablet (81 mg total) by mouth daily. Swallow whole., Disp: 30 tablet, Rfl: 11   atorvastatin (LIPITOR) 80 MG tablet, Take 1 tablet (80 mg total) by mouth daily., Disp: 90 tablet, Rfl: 1   clopidogrel (PLAVIX) 75 MG tablet, Take 1 tablet (75 mg total) by mouth daily., Disp: 90 tablet, Rfl: 3   empagliflozin (JARDIANCE) 10 MG TABS tablet, Take 1 tablet (10 mg total) by mouth daily before breakfast., Disp: 90 tablet, Rfl: 1   metoprolol succinate (TOPROL XL) 50 MG 24 hr tablet, Take 1 tablet (50 mg total) by mouth daily. Take with or immediately following a meal., Disp: 90 tablet, Rfl: 3   zolpidem (AMBIEN CR) 12.5 MG CR tablet, Take 1 tablet (12.5 mg total) by mouth at bedtime as needed for sleep.,  Disp: 15 tablet, Rfl: 0   Objective:   Vitals:   09/20/20 1302  BP: (!) 138/97  Pulse: (!) 103  SpO2: 96%    Physical Exam Vitals and nursing note reviewed.  Constitutional:      Appearance: Normal appearance.  HENT:     Head: Normocephalic and atraumatic.  Cardiovascular:     Rate and Rhythm: Normal rate.     Pulses: Normal pulses.  Pulmonary:     Effort: Pulmonary effort is normal. No respiratory distress.     Breath sounds: No wheezing.  Chest:    Abdominal:     General: Abdomen is flat. There is no distension.     Tenderness: There is no abdominal tenderness.  Musculoskeletal:         General: No swelling or deformity.  Skin:    General: Skin is warm.     Capillary Refill: Capillary refill takes less than 2 seconds.     Coloration: Skin is not jaundiced.     Findings: Erythema and lesion present. No bruising.  Neurological:     General: No focal deficit present.     Mental Status: He is alert. Mental status is at baseline.  Psychiatric:        Mood and Affect: Mood normal.        Behavior: Behavior normal.        Thought Content: Thought content normal.    Assessment & Plan:  S/P CABG x 3  Wound of sternal region  Recommend CT scan for better understanding of the depth and involvement of the wound.  He certainly may need debridement.  Recommend another course of antibiotics while working this up.  I spoke with Thurmond Butts with CT surgery.  I remain available as needed.  Pictures were obtained of the patient and placed in the chart with the patient's or guardian's permission.   [place], DO

## 2020-09-20 NOTE — Progress Notes (Signed)
Wound CX sent to [facility]/ conformation # [id number]

## 2020-09-20 NOTE — Progress Notes (Signed)
Keflex 500 BID ordered x 7 days and sent into patient's preferred pharmacy per Dr. Cyndia Bent. Patient made aware.

## 2020-09-22 ENCOUNTER — Other Ambulatory Visit: Payer: Self-pay

## 2020-09-22 MED ORDER — CEPHALEXIN 500 MG PO CAPS
500.0000 mg | ORAL_CAPSULE | Freq: Two times a day (BID) | ORAL | 0 refills | Status: DC
Start: 2020-09-22 — End: 2020-09-26

## 2020-09-22 NOTE — Progress Notes (Signed)
Patient arrived in the office to express concern about antibiotic prescription that was called into [facility]. He stated that he only received 7 pills of Keflex for RX of BID for 7 days. Advised patient that another prescription would be called into his pharmacy for refill to complete his course. Patient acknowledged receipt.   Also, patient asked to have his sternal wound looked at. He stated that it has been draining. Appearance shows serosanguinous drainage, wound bed has granulation tissue present. Wound tracks at 12 o'clock and is about 1 cm x 1 cm x 1 cm. Wound packed with 1/2 in plain packing and applied 4x4 gauze and primapore tape to skin. Patient is aware to change dressing daily, but has been changing it twice a day because of drainage. Advised to keep it clean and dry. He does have an area just above the opening to his wound that is also draining, serosanguinous fluid, it is not open. The wound however, does not track to this point.   Patient is to complete his 7 day course of antibiotics Monday and come back to the office with an appointment on Wednesday. Advised to complete his full course of antibiotics and give the office a call if the wound begins to look worse in the meantime. He acknowledged receipt.

## 2020-09-23 LAB — HOUSE ACCOUNT TRACKING

## 2020-09-23 LAB — WOUND CULTURE
MICRO NUMBER:: 12249432
SPECIMEN QUALITY:: ADEQUATE

## 2020-09-26 ENCOUNTER — Telehealth: Payer: Self-pay | Admitting: Plastic Surgery

## 2020-09-26 ENCOUNTER — Other Ambulatory Visit: Payer: Self-pay

## 2020-09-26 ENCOUNTER — Encounter: Payer: Self-pay | Admitting: Physician Assistant

## 2020-09-26 ENCOUNTER — Ambulatory Visit: Payer: Self-pay | Admitting: Physician Assistant

## 2020-09-26 VITALS — BP 135/88 | HR 79 | Resp 20 | Ht 72.0 in | Wt 270.0 lb

## 2020-09-26 DIAGNOSIS — S21109A Unspecified open wound of unspecified front wall of thorax without penetration into thoracic cavity, initial encounter: Secondary | ICD-10-CM

## 2020-09-26 MED ORDER — CEPHALEXIN 500 MG PO CAPS: 500.0000 mg | ORAL_CAPSULE | Freq: Two times a day (BID) | ORAL | 0 refills | Status: DC

## 2020-09-26 NOTE — Progress Notes (Signed)
[address]       [place] [address]             [phone]    Roberto Morrison is a 50 y.o. 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 Orvan Seen. He had a radial artery harvest and bilateral internal mammary harvests. He was discharged on asa/plavix. He did follow-up with Cardiology on 7/11 and an echocardiogram is recommended for next month. He saw a PA in our office last week and had some sternal drainage and redness around the lower portion of his sternal incision. He has followed up a few times in our office for wound checks and was started on Keflex.   On 8/12, he contacted myself who was on-call for our service to report he had more swelling, erythema, and drainage from the lower sternal wound. He was in [place] at the time.  He said he used a sterile needle to puncture a raised area at the lower end of the sternal incision and he was able to express some yellow fluid. We arranged to see him in our office that following Monday, 8/15 He denied any fever, chills, or any sensation of movement in his sternum.   We referred him to Dr. Marla Roe and he saw her on 8/16. He had a CT scan which showed a superficial infection with wires intact. No abscess or fluid collection noted (full report below) .  A wound culture was obtained in our office which showed Staph aureus and Dr. Cyndia Bent wrote for Keflex 500 mg twice daily x7 days.  He is following up today for a wound check.    1. Wound of sternal region    No past medical history on file. No past surgical history pertinent negatives on file. Scheduled Meds: Current Outpatient Medications on File Prior to Visit  Medication Sig Dispense Refill   aspirin EC 81 MG EC tablet Take 1 tablet (81 mg total) by mouth daily. Swallow whole. 30 tablet 11   atorvastatin (LIPITOR) 80 MG tablet Take 1 tablet (80 mg total) by mouth daily. 90 tablet 1   cephALEXin (KEFLEX) 500 MG capsule Take 1 capsule (500 mg total) by mouth 2 (two)  times daily. 7 capsule 0   clopidogrel (PLAVIX) 75 MG tablet Take 1 tablet (75 mg total) by mouth daily. 90 tablet 3   empagliflozin (JARDIANCE) 10 MG TABS tablet Take 1 tablet (10 mg total) by mouth daily before breakfast. 90 tablet 1   metoprolol succinate (TOPROL XL) 50 MG 24 hr tablet Take 1 tablet (50 mg total) by mouth daily. Take with or immediately following a meal. 90 tablet 3   zolpidem (AMBIEN CR) 12.5 MG CR tablet Take 1 tablet (12.5 mg total) by mouth at bedtime as needed for sleep. 15 tablet 0   No current facility-administered medications on file prior to visit.     Allergies  Allergen Reactions   Prednisone Other (See Comments)    "Hiccups"    Active Problems:   * No active hospital problems. *  Blood pressure (!) 146/91, pulse 79, resp. rate 20, height 6' (1.829 m), weight 270 lb (122.5 kg), SpO2 97 %.  CLINICAL DATA:  Wound dehiscence status post coronary bypass graft.   EXAM: CT CHEST WITHOUT CONTRAST   TECHNIQUE: Multidetector CT imaging of the chest was performed following the standard protocol without IV contrast.   COMPARISON:  August 09, 2020.   FINDINGS: Cardiovascular: No  evidence of thoracic aortic aneurysm. Status post coronary bypass graft. Normal cardiac size. Small pericardial effusion is noted.   Mediastinum/Nodes: No enlarged mediastinal or axillary lymph nodes. Thyroid gland, trachea, and esophagus demonstrate no significant findings.   Lungs/Pleura: No pneumothorax or pleural effusion is noted. Left lung is clear. Atelectasis or scarring is noted anteriorly in the right upper lobe.   Upper Abdomen: No acute abnormality.   Musculoskeletal: No fracture is noted. Sternotomy wires are intact. No fluid collection or abscess is seen in the sternal region. Stranding of the subcutaneous tissues is seen overlying the sternum suggesting edema or possibly inflammation such as cellulitis.   IMPRESSION: Sternotomy wires are intact. No definite  fluid collection or abscess is seen in the sternal region. Stranding of the subcutaneous tissues is seen overlying the sternum suggesting edema or possibly inflammation such as cellulitis.   Small pericardial effusion is noted.   Atelectasis or scarring is seen anteriorly in the right upper lobe.     Electronically Signed   By: Marijo Conception M.D.   On: 09/20/2020 15:39    Subjective Objective: Vital signs (most recent): Blood pressure (!) 146/91, pulse 79, resp. rate 20, height 6' (1.829 m), weight 270 lb (122.5 kg), SpO2 97 %. Assessment & Plan  Superficial wound dehiscence-likely a suture reaction with localized infection.  Cultures grew staph aureus  He has been on one week consistently of Keflex and we are going to have him take another week of antibiotics.  Patient is to pack his incision with 1/4 inch iodoform packing.  He is to cover his incisions with 2 x 2 sterile gauze and tape.  He was given supplies today in the office.  Dr. Cyndia Bent looked at the wound and assisted with debridement.  Plan: Follow-up in 1 week for wound check.  Continue to pack your wounds daily.  Continue an additional week of Keflex 500 mg twice daily.   [name] 09/26/2020

## 2020-09-26 NOTE — Telephone Encounter (Signed)
Pt reports he started second round of ABX last Thursday: requested [place] was completed on 8/16. Pt also has appt with [facility] at 3 pm. Adv pt that I would send the message to Dr. Marla Roe and will contact him when a response is received as she is in the OR for the day. Pt conveyed understanding.

## 2020-09-26 NOTE — Telephone Encounter (Signed)
Patient reports a second opening on his incision and is concerned about potential infection. Chest wound from heart sx 6/21. Please call to advise [phone]. Thank you.

## 2020-09-28 ENCOUNTER — Ambulatory Visit: Payer: 59

## 2020-10-03 ENCOUNTER — Other Ambulatory Visit: Payer: Self-pay

## 2020-10-03 ENCOUNTER — Ambulatory Visit (INDEPENDENT_AMBULATORY_CARE_PROVIDER_SITE_OTHER): Payer: Self-pay | Admitting: Physician Assistant

## 2020-10-03 VITALS — BP 143/93 | HR 83 | Resp 20 | Ht 72.0 in | Wt 270.0 lb

## 2020-10-03 DIAGNOSIS — Z951 Presence of aortocoronary bypass graft: Secondary | ICD-10-CM

## 2020-10-03 DIAGNOSIS — S21109A Unspecified open wound of unspecified front wall of thorax without penetration into thoracic cavity, initial encounter: Secondary | ICD-10-CM

## 2020-10-03 NOTE — Progress Notes (Signed)
[address]       [place] [address]             [phone]    Roberto Morrison is a 50 y.o. 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 Orvan Seen. He had a radial artery harvest and bilateral internal mammary harvests. He was discharged on asa/plavix. He did follow-up with Cardiology on 7/11 and an echocardiogram is recommended for next month. He saw a PA in our office last week and had some sternal drainage and redness around the lower portion of his sternal incision. He has followed up a few times in our office for wound checks and was started on Keflex.   On 8/12, he contacted myself who was on-call for our service to report he had more swelling, erythema, and drainage from the lower sternal wound. He was in [place] at the time.  He said he used a sterile needle to puncture a raised area at the lower end of the sternal incision and he was able to express some yellow fluid. We arranged to see him in our office that following Monday, 8/15 He denied any fever, chills, or any sensation of movement in his sternum.   We referred him to Dr. Marla Roe and he saw her on 8/16. He had a CT scan which showed a superficial infection with wires intact. No abscess or fluid collection noted (full report below) .  A wound culture was obtained in our office which showed Staph aureus and Dr. Cyndia Bent wrote for Keflex 500 mg twice daily x7 days.  He is following up today for a wound check.  Today, his wound is clean and dry without any drainage.  The wound looks like it is healing well with good blood supply.    1. Wound of sternal region    No past medical history on file. No past surgical history pertinent negatives on file. Scheduled Meds: Current Outpatient Medications on File Prior to Visit  Medication Sig Dispense Refill   aspirin EC 81 MG EC tablet Take 1 tablet (81 mg total) by mouth daily. Swallow whole. 30 tablet 11   atorvastatin (LIPITOR) 80 MG tablet Take 1 tablet (80  mg total) by mouth daily. 90 tablet 1   cephALEXin (KEFLEX) 500 MG capsule Take 1 capsule (500 mg total) by mouth 2 (two) times daily. 14 capsule 0   clopidogrel (PLAVIX) 75 MG tablet Take 1 tablet (75 mg total) by mouth daily. 90 tablet 3   empagliflozin (JARDIANCE) 10 MG TABS tablet Take 1 tablet (10 mg total) by mouth daily before breakfast. 90 tablet 1   metoprolol succinate (TOPROL XL) 50 MG 24 hr tablet Take 1 tablet (50 mg total) by mouth daily. Take with or immediately following a meal. 90 tablet 3   zolpidem (AMBIEN CR) 12.5 MG CR tablet Take 1 tablet (12.5 mg total) by mouth at bedtime as needed for sleep. 15 tablet 0   No current facility-administered medications on file prior to visit.     Active Problems:   * No active hospital problems. *  Today's Vitals   10/03/20 1233  BP: (!) 143/93  Pulse: 83  Resp: 20  SpO2: 97%  Weight: 270 lb (122.5 kg)  Height: 6' (1.829 m)   Body mass index is 36.62 kg/m.   CLINICAL DATA:  Wound dehiscence status post coronary bypass graft.   EXAM: CT CHEST WITHOUT CONTRAST   TECHNIQUE: Multidetector CT  imaging of the chest was performed following the standard protocol without IV contrast.   COMPARISON:  August 09, 2020.   FINDINGS: Cardiovascular: No evidence of thoracic aortic aneurysm. Status post coronary bypass graft. Normal cardiac size. Small pericardial effusion is noted.   Mediastinum/Nodes: No enlarged mediastinal or axillary lymph nodes. Thyroid gland, trachea, and esophagus demonstrate no significant findings.   Lungs/Pleura: No pneumothorax or pleural effusion is noted. Left lung is clear. Atelectasis or scarring is noted anteriorly in the right upper lobe.   Upper Abdomen: No acute abnormality.   Musculoskeletal: No fracture is noted. Sternotomy wires are intact. No fluid collection or abscess is seen in the sternal region. Stranding of the subcutaneous tissues is seen overlying the sternum suggesting edema or  possibly inflammation such as cellulitis.   IMPRESSION: Sternotomy wires are intact. No definite fluid collection or abscess is seen in the sternal region. Stranding of the subcutaneous tissues is seen overlying the sternum suggesting edema or possibly inflammation such as cellulitis.   Small pericardial effusion is noted.   Atelectasis or scarring is seen anteriorly in the right upper lobe.     Electronically Signed   By: Marijo Conception M.D.   On: 09/20/2020 15:39    Subjective Objective: Vital signs (most recent): Blood pressure (!) 146/91, pulse 79, resp. rate 20, height 6' (1.829 m), weight 270 lb (122.5 kg), SpO2 97 %. Assessment & Plan  Superficial wound dehiscence-likely a suture reaction with localized infection.  Cultures grew staph aureus.  The patient has been on a total of 3 weeks of Keflex so I do not feel the need to continue this medication at this time.  He does not have any drainage coming from his incision anymore and the wounds look to be healing well with good blood supply.  I packed his sternal wounds with half an inch iodoform ribbon packing and put a sterile 4 x 4 gauze dressing over it.  I informed the patient to continue to pack his incisions once a day.  Remove the packing before showering and replace the packing after showering.  He has been using Dial soap. Patient asked for an Ambien refill today.  A few weeks ago I had given him a few pills to get him by until he established a primary care provider.  Today he informs me that he has not established a primary care provider and that he needs a refill of his Ambien.  I have reached out to Samuel Bouche, NP to establish care for him.  We have sent an official referral over the to their sports medicine clinic in [place].  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  They can also treat him for his insomnia and follow-up with him long-term.  Plan: Follow-up  in 2 week for wound check.  Continue to take your Keflex medication as prescribed.  The patient informs me he has 2 days left.  I did not renew his prescription.  If he notices more drainage or copious amounts of drainage coming from his incision he is to call our office immediately and we can move his appointment.   [name] 10/03/2020

## 2020-10-17 ENCOUNTER — Other Ambulatory Visit: Payer: Self-pay

## 2020-10-17 ENCOUNTER — Ambulatory Visit (INDEPENDENT_AMBULATORY_CARE_PROVIDER_SITE_OTHER): Payer: Self-pay | Admitting: Physician Assistant

## 2020-10-17 DIAGNOSIS — Z5189 Encounter for other specified aftercare: Secondary | ICD-10-CM

## 2020-10-17 NOTE — Progress Notes (Signed)
      [address]       [place] [address]             [phone]        The patient presented to the office and was rude to the front staff. After waiting for about 10 minutes in the waiting room the patient left and ask the nurse to reschedule him.   We will get him back on the schedule for a wound check at our earliest time slot.    Nicholes Rough, PA-C

## 2020-10-18 ENCOUNTER — Telehealth: Payer: Self-pay | Admitting: *Deleted

## 2020-10-18 ENCOUNTER — Ambulatory Visit (HOSPITAL_COMMUNITY): Payer: 59 | Attending: Internal Medicine

## 2020-10-18 DIAGNOSIS — Z951 Presence of aortocoronary bypass graft: Secondary | ICD-10-CM | POA: Diagnosis not present

## 2020-10-18 DIAGNOSIS — I255 Ischemic cardiomyopathy: Secondary | ICD-10-CM | POA: Diagnosis present

## 2020-10-18 DIAGNOSIS — I25118 Atherosclerotic heart disease of native coronary artery with other forms of angina pectoris: Secondary | ICD-10-CM

## 2020-10-18 LAB — ECHOCARDIOGRAM COMPLETE
Area-P 1/2: 4.39 cm2
P 1/2 time: 212 msec
S' Lateral: 3.8 cm

## 2020-10-18 NOTE — Telephone Encounter (Signed)
Called patient and left VM for return call to reschedule wound check follow up.

## 2020-10-19 ENCOUNTER — Telehealth: Payer: Self-pay | Admitting: Family

## 2020-10-19 DIAGNOSIS — I255 Ischemic cardiomyopathy: Secondary | ICD-10-CM

## 2020-10-19 DIAGNOSIS — I25118 Atherosclerotic heart disease of native coronary artery with other forms of angina pectoris: Secondary | ICD-10-CM

## 2020-10-19 MED ORDER — ENTRESTO 24-26 MG PO TABS: 1.0000 | ORAL_TABLET | Freq: Two times a day (BID) | ORAL | 11 refills | Status: DC

## 2020-10-19 NOTE — Telephone Encounter (Signed)
Roberto Dubonnet, NP  10/18/2020  4:41 PM EDT     Heart pumping function remains low. Severe thickening of heart muscle suggestive of hypertension. No significant valvular abnormalities. Recommend starting Entresto 24-'26mg'$  BID with BMP in 2 weeks. Please provide coupon. Follow up as scheduled.  The patient has been notified of the result and verbalized understanding.  All questions (if any) were answered. Antonieta Iba, RN 10/19/2020 2:54 PM  Roberto Morrison has been ordered.  BMET to be done when patient sees Dr. Gwenlyn Found 10/4

## 2020-10-19 NOTE — Telephone Encounter (Signed)
Patient returning call to discuss Echo results

## 2020-10-25 ENCOUNTER — Other Ambulatory Visit: Payer: Self-pay

## 2020-10-25 ENCOUNTER — Ambulatory Visit (INDEPENDENT_AMBULATORY_CARE_PROVIDER_SITE_OTHER): Payer: Self-pay | Admitting: Physician Assistant

## 2020-10-25 VITALS — BP 120/76 | HR 96 | Resp 20 | Ht 72.0 in | Wt 271.0 lb

## 2020-10-25 DIAGNOSIS — S21109A Unspecified open wound of unspecified front wall of thorax without penetration into thoracic cavity, initial encounter: Secondary | ICD-10-CM

## 2020-10-25 DIAGNOSIS — Z951 Presence of aortocoronary bypass graft: Secondary | ICD-10-CM

## 2020-10-25 NOTE — Progress Notes (Signed)
[address]       [place] [address]             [phone]    Roberto Morrison is a 50 y.o. 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 Orvan Seen. He had a radial artery harvest and bilateral internal mammary harvests. He was discharged on asa/plavix. He did follow-up with Cardiology on 7/11 and an echocardiogram is recommended for next month. He saw a PA in our office shortly after and had some sternal drainage and redness around the lower portion of his sternal incision. He has followed up a few times in our office for wound checks and was started on Keflex.   On 8/12, he contacted myself who was on-call for our service to report he had more swelling, erythema, and drainage from the lower sternal wound. He was in [place] at the time.  He said he used a sterile needle to puncture a raised area at the lower end of the sternal incision and he was able to express some yellow fluid. We arranged to see him in our office that following Monday, 8/15 He denied any fever, chills, or any sensation of movement in his sternum.   We referred him to Dr. Marla Roe and he saw her on 8/16. He had a CT scan which showed a superficial infection with wires intact. No abscess or fluid collection noted (full report below) .  A wound culture was obtained in our office which showed Staph aureus and Dr. Cyndia Bent wrote for Keflex 500 mg twice daily x7 days.   He returned for a wound check later in August and we instructed him to keep his incisions clean and dry. We encouraged him to pack with iodoform packing and keep to his sternal precautions.   Today, he presents for a wound check after leaving the office last week when he wasn't seen within 10-15 minutes of arrival. He is doing okay today and still continuing sternal precautions. He had a new spot open up but his other areas are well healed. He continues to pack the mid-sternal incision with iodoform ribbon.     1. Wound of sternal region     No past medical history on file. No past surgical history pertinent negatives on file. Scheduled Meds: Current Outpatient Medications on File Prior to Visit  Medication Sig Dispense Refill   aspirin EC 81 MG EC tablet Take 1 tablet (81 mg total) by mouth daily. Swallow whole. 30 tablet 11   atorvastatin (LIPITOR) 80 MG tablet Take 1 tablet (80 mg total) by mouth daily. 90 tablet 1   cephALEXin (KEFLEX) 500 MG capsule Take 1 capsule (500 mg total) by mouth 2 (two) times daily. 14 capsule 0   clopidogrel (PLAVIX) 75 MG tablet Take 1 tablet (75 mg total) by mouth daily. 90 tablet 3   empagliflozin (JARDIANCE) 10 MG TABS tablet Take 1 tablet (10 mg total) by mouth daily before breakfast. 90 tablet 1   metoprolol succinate (TOPROL XL) 50 MG 24 hr tablet Take 1 tablet (50 mg total) by mouth daily. Take with or immediately following a meal. 90 tablet 3   zolpidem (AMBIEN CR) 12.5 MG CR tablet Take 1 tablet (12.5 mg total) by mouth at bedtime as needed for sleep. 15 tablet 0   No current facility-administered medications on file prior to visit.     Active Problems:   * No active hospital problems. *  Vitals:   10/25/20 1002  BP: 120/76  Pulse: 96  Resp: 20  SpO2: 96%    Physical Exam:  Cor: RRR, no murmur Pulm: CTA bilaterally and in all fields Abd: no tenderness Wound: small 0.5 cm x 0.5 cm area mid sternum that is open without drainage.  Ext: no edema   CLINICAL DATA:  Wound dehiscence status post coronary bypass graft.   EXAM: CT CHEST WITHOUT CONTRAST   TECHNIQUE: Multidetector CT imaging of the chest was performed following the standard protocol without IV contrast.   COMPARISON:  August 09, 2020.   FINDINGS: Cardiovascular: No evidence of thoracic aortic aneurysm. Status post coronary bypass graft. Normal cardiac size. Small pericardial effusion is noted.   Mediastinum/Nodes: No enlarged mediastinal or axillary lymph nodes. Thyroid gland, trachea, and esophagus  demonstrate no significant findings.   Lungs/Pleura: No pneumothorax or pleural effusion is noted. Left lung is clear. Atelectasis or scarring is noted anteriorly in the right upper lobe.   Upper Abdomen: No acute abnormality.   Musculoskeletal: No fracture is noted. Sternotomy wires are intact. No fluid collection or abscess is seen in the sternal region. Stranding of the subcutaneous tissues is seen overlying the sternum suggesting edema or possibly inflammation such as cellulitis.   IMPRESSION: Sternotomy wires are intact. No definite fluid collection or abscess is seen in the sternal region. Stranding of the subcutaneous tissues is seen overlying the sternum suggesting edema or possibly inflammation such as cellulitis.   Small pericardial effusion is noted.   Atelectasis or scarring is seen anteriorly in the right upper lobe.     Electronically Signed   By: Marijo Conception M.D.   On: 09/20/2020 15:39    Assessment & Plan  Superficial wound dehiscence-likely a suture reaction with localized infection.  Cultures grew staph aureus. He has been on Keflex intermittently for 3 weeks.  He no longer has any drainage and the area appears clear and dry. His hole is mid sternum and abou 0.5 cm x 0.5 cm with minimum tracking.  Referred to a PCP last time he was in the office and provided contact information. He has yet to make an appointment but plans to call again today for his Ambien.  Discussed sternal precautions and I do not recommend any NO supplements since he is already on blood pressure medications and it could decrease BP further. I would recommend discussing any supplements with your cardiologist. He was recently started on Entresto and his echocardiogram results were discussed with the patient.   Plan: Follow-up in 2 week for wound check. He still has a spot that is open mid incision which he continues to pack. I do think it is healing well and not infected. No abx indicated  at this time.   [name] 10/03/2020

## 2020-10-25 NOTE — Addendum Note (Signed)
Addended by: [name] on: 10/25/2020 10:38 AM   Modules accepted: Level of Service

## 2020-10-31 ENCOUNTER — Ambulatory Visit: Payer: 59 | Admitting: General Practice

## 2020-10-31 ENCOUNTER — Encounter: Payer: Self-pay | Admitting: General Practice

## 2020-10-31 ENCOUNTER — Other Ambulatory Visit: Payer: Self-pay

## 2020-10-31 VITALS — BP 121/73 | HR 82 | Ht 72.0 in | Wt 273.2 lb

## 2020-10-31 DIAGNOSIS — E785 Hyperlipidemia, unspecified: Secondary | ICD-10-CM | POA: Diagnosis not present

## 2020-10-31 DIAGNOSIS — I255 Ischemic cardiomyopathy: Secondary | ICD-10-CM | POA: Diagnosis not present

## 2020-10-31 DIAGNOSIS — I25118 Atherosclerotic heart disease of native coronary artery with other forms of angina pectoris: Secondary | ICD-10-CM

## 2020-10-31 NOTE — Progress Notes (Signed)
Cardiology Clinic Note   Patient Name: Roberto Morrison Date of Encounter: 10/31/2020  Primary Care Provider:  Patient, No Pcp Per (Inactive) Primary Cardiologist:  Quay Burow, MD  Patient Profile    Roberto Morrison 50 year old male presents to the clinic today for review of his echocardiogram and systolic CHF.  Past Medical History    No past medical history on file. Past Surgical History:  Procedure Laterality Date   CORONARY ARTERY BYPASS GRAFT N/A 07/26/2020   Procedure: CORONARY ARTERY BYPASS GRAFTING (CABG) X THREE, USING LEFT INTERNAL MAMMARY ARTERY< RIGHT INTERNAL MAMMARY ARTERY AND LEFT RADIAL ARTERY CONDUITS.;  Surgeon: Wonda Olds, MD;  Location: [facility];  Service: Open Heart Surgery;  Laterality: N/A;   INTERCOSTAL NERVE BLOCK  07/26/2020   Procedure: INTERCOSTAL NERVE BLOCK;  Surgeon: Wonda Olds, MD;  Location: [facility];  Service: Open Heart Surgery;;   LEFT HEART CATH AND CORONARY ANGIOGRAPHY N/A 07/22/2020   Procedure: LEFT HEART CATH AND CORONARY ANGIOGRAPHY;  Surgeon: Lorretta Harp, MD;  Location: [facility];  Service: Cardiovascular;  Laterality: N/A;   RADIAL ARTERY HARVEST Left 07/26/2020   Procedure: RADIAL ARTERY HARVEST;  Surgeon: Wonda Olds, MD;  Location: [facility];  Service: Open Heart Surgery;  Laterality: Left;   TEE WITHOUT CARDIOVERSION N/A 07/26/2020   Procedure: TRANSESOPHAGEAL ECHOCARDIOGRAM (TEE);  Surgeon: Wonda Olds, MD;  Location: [facility];  Service: Open Heart Surgery;  Laterality: N/A;    Allergies  Allergies  Allergen Reactions   Prednisone Other (See Comments)    "Hiccups"     History of Present Illness    Roberto Morrison has a PMH of coronary artery disease status post CABG 6/22, ischemic cardiomyopathy, and hyperlipidemia.  He presented to the emergency department 6/22 with chest discomfort that occurred with increased physical activity.  He underwent cardiac catheterization which showed severe coronary artery  disease in his LAD and circumflex vessels.  He was also noted to have borderline disease in his RCA.  His echocardiogram showed an LVEF of 30-35%.  He underwent CABG x3 on 07/26/2020 LIMA-LAD, RIMA-PDA, left radial-obtuse marginal of circumflex.  He was also prescribed oral nitrates to optimize his arterial graft patency.  He progressed well postoperatively and his postoperative course was unremarkable.  He was seen by Laurann Montana, PA-C on 08/15/2020.  During that time he denied exertional chest discomfort.  He did note some incisional discomfort with laying on that side.  His pain resolved with laying on his back.  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  He reported that he had returned to some of that his gym activities.  He denied dyspnea with increased exertion and reported that he was maintaining sternal precautions.  He presents to the clinic today for follow-up evaluation and to review his echocardiogram.  He states he feels well.  He did have some increased stress related to a relationship/girlfriend.  He reports he feels seem to have recovered better with less stress.  He continues to be very physically active walking 30 minutes to 60 minutes 3 to 5 days/week he also continues to left/resistance training 3 to 5 days/week.  He reports compliance with his medications.  We reviewed his current medication list.  He denies side effects.  He is tolerating his Entresto well.  I will order a BMP today.  We discussed the progression/titration of the medication and reviewed his recent echocardiogram.  I will  have him follow-up in 4 weeks.  I will give him the sleep hygiene information and information for PCP as well.  Today he denies chest pain, shortness of breath, lower extremity edema, fatigue, palpitations, melena, hematuria, hemoptysis, diaphoresis, weakness, presyncope, syncope, orthopnea, and PND.   Home Medications    Prior to Admission  medications   Medication Sig Start Date End Date Taking? Authorizing Provider  aspirin EC 81 MG EC tablet Take 1 tablet (81 mg total) by mouth daily. Swallow whole. 07/31/20   Antony Odea, PA-C  atorvastatin (LIPITOR) 80 MG tablet Take 1 tablet (80 mg total) by mouth daily. 08/15/20   Loel Dubonnet, NP  cephALEXin (KEFLEX) 500 MG capsule Take 1 capsule (500 mg total) by mouth 2 (two) times daily. 09/26/20   [name], PA-C  clopidogrel (PLAVIX) 75 MG tablet Take 1 tablet (75 mg total) by mouth daily. 08/15/20   Loel Dubonnet, NP  empagliflozin (JARDIANCE) 10 MG TABS tablet Take 1 tablet (10 mg total) by mouth daily before breakfast. 08/15/20   Loel Dubonnet, NP  metoprolol succinate (TOPROL XL) 50 MG 24 hr tablet Take 1 tablet (50 mg total) by mouth daily. Take with or immediately following a meal. 08/15/20 08/15/21  Loel Dubonnet, NP  sacubitril-valsartan (ENTRESTO) 24-26 MG Take 1 tablet by mouth 2 (two) times daily. 10/19/20   Loel Dubonnet, NP  zolpidem (AMBIEN CR) 12.5 MG CR tablet Take 1 tablet (12.5 mg total) by mouth at bedtime as needed for sleep. Patient not taking: Reported on 10/25/2020 09/07/20 09/07/21  [name], PA-C    Family History    No family history on file. has no family status information on file.   Social History    Social History   Socioeconomic History   Marital status: Single    Spouse name: Not on file   Number of children: Not on file   Years of education: Not on file   Highest education level: Not on file  Occupational History   Not on file  Tobacco Use   Smoking status: Never   Smokeless tobacco: Never  Substance and Sexual Activity   Alcohol use: Not Currently   Drug use: Never   Sexual activity: Not on file  Other Topics Concern   Not on file  Social History Narrative   Not on file   Social Determinants of Health   Financial Resource Strain: Not on file  Food Insecurity: Not on file  Transportation Needs: Not on  file  Physical Activity: Not on file  Stress: Not on file  Social Connections: Not on file  Intimate Partner Violence: Not on file     Review of Systems    General:  No chills, fever, night sweats or weight changes.  Cardiovascular:  No chest pain, dyspnea on exertion, edema, orthopnea, palpitations, paroxysmal nocturnal dyspnea. Dermatological: No rash, lesions/masses Respiratory: No cough, dyspnea Urologic: No hematuria, dysuria Abdominal:   No nausea, vomiting, diarrhea, bright red blood per rectum, melena, or hematemesis Neurologic:  No visual changes, wkns, changes in mental status. All other systems reviewed and are otherwise negative except as noted above.  Physical Exam    VS:  BP 121/73   Pulse 82   Ht 6' (1.829 m)   Wt 273 lb 3.2 oz (123.9 kg)   SpO2 99%   BMI 37.05 kg/m  , BMI Body mass index is 37.05 kg/m. GEN: Well nourished, well developed, in no acute distress. HEENT:  normal. Neck: Supple, no JVD, carotid bruits, or masses. Cardiac: RRR, no murmurs, rubs, or gallops. No clubbing, cyanosis, edema.  Radials/DP/PT 2+ and equal bilaterally.  Respiratory:  Respirations regular and unlabored, clear to auscultation bilaterally. GI: Soft, nontender, nondistended, BS + x 4. MS: no deformity or atrophy. Skin: warm and dry, no rash. Neuro:  Strength and sensation are intact. Psych: Normal affect.  Accessory Clinical Findings    Recent Labs: 07/27/2020: Magnesium 2.2 09/12/2020: ALT 35; BUN 13; Creatinine, Ser 1.34; Hemoglobin 16.5; Platelets 216; Potassium 4.2; Sodium 139   Recent Lipid Panel    Component Value Date/Time   CHOL 113 09/12/2020 0816   TRIG 99 09/12/2020 0816   HDL 33 (L) 09/12/2020 0816   CHOLHDL 3.4 09/12/2020 0816   CHOLHDL 5.3 07/25/2020 0928   VLDL 14 07/25/2020 0928   LDLCALC 61 09/12/2020 0816    ECG personally reviewed by me today-none today.  Echocardiogram 07/22/2020 IMPRESSIONS     1. Left ventricular ejection fraction, by  estimation, is 30 to 35%. The  left ventricle has moderately decreased function. The left ventricle  demonstrates regional wall motion abnormalities (see scoring  diagram/findings for description). Left ventricular   diastolic parameters are consistent with Grade I diastolic dysfunction  (impaired relaxation). There is severe hypokinesis of the left  ventricular, mid-apical anteroseptal wall.   2. Right ventricular systolic function is normal. The right ventricular  size is normal.   3. The mitral valve is normal in structure. Trivial mitral valve  regurgitation. No evidence of mitral stenosis.   4. The aortic valve is normal in structure. Aortic valve regurgitation is  not visualized. No aortic stenosis is present.   Cardiac catheterization 07/22/20   IMPRESSION: Mr. Breden was admitted with a non-STEMI.  He has three-vessel disease.  He has a long segmental mid LAD lesion and a moderately long mid AV groove circumflex lesion lesion with what appears to be a moderate though not significant distal RCA lesion.  His filling pressures were normal.  His EF looked preserved.  I believe he would be best served with coronary artery bypass grafting with bilateral IMA's.  Surgical consult has been called.  The sheath was removed and a TR band was placed on the right wrist to achieve patent hemostasis.  The patient left the lab in stable condition.  Plan will be to restart IV heparin 4 hours after sheath removal.   Quay Burow. MD, [place] 07/22/2020 4:11 PM   Diagnostic Dominance: Right      Echocardiogram 10/18/2020  IMPRESSIONS     1. Left ventricular ejection fraction, by estimation, is 40 to 45%. The  left ventricle has mildly decreased function. The left ventricle  demonstrates regional wall motion abnormalities (suggestive of LAD  disease). There is severe asymmetric left  ventricular hypertrophy. Left ventricular diastolic parameters are  consistent with Grade I diastolic dysfunction  (impaired relaxation). No  systolic anterior motion of the mitral valve.   2. Right ventricular systolic function is mildly reduced. The right  ventricular size is normal. Mildly increased right ventricular wall  thickness. Tricuspid regurgitation signal is inadequate for assessing PA  pressure.   3. The mitral valve is normal in structure. No evidence of mitral valve  regurgitation.   4. The aortic valve is tricuspid. Aortic valve regurgitation is mild. No  aortic stenosis is present.   5. The inferior vena cava is normal in size with greater than 50%  respiratory variability, suggesting right atrial pressure of 3 mmHg.  Comparison(s): A prior study was performed on 07/22/20. LVEF has improved  with normalization of LV size.  Assessment & Plan   1.  HFrEF/ischemic cardiomyopathy-continues to increase physical activity.  Denies increased dyspnea with exertion.  Prior to CABG EF 30-35%, intraoperative LVEF 40-45%.  Echocardiogram 10/18/2020 showed EF 40-45% with G1 DD.  Blood pressure did not previously allow ACE ARB Arni. Continue metoprolol, Jardiance Entresto 24-26 started 10/19/2020 Order BMP  Coronary artery disease-denies chest pain.  Status post CABG x3 07/26/2020. Continue aspirin, atorvastatin, clopidogrel, metoprolol Heart healthy low-sodium diet-salty 6 given Increase physical activity as tolerated  Hyperlipidemia-07/25/2020: VLDL 14 09/12/2020: Cholesterol, Total 113; HDL 33; LDL Chol Calc (NIH) 61; Triglycerides 99 Continue atorvastatin, aspirin Heart healthy low-sodium high-fiber diet Increase physical activity as tolerated  Disposition: Follow-up with Dr. Gwenlyn Found or APP in 4 to 6 weeks.  Jossie Ng. Krishauna Schatzman NP-C    10/31/2020, 2:57 PM [facility] [address] [address] Office [phone] Fax [phone]  Notice: This dictation was prepared with Dragon dictation along with smaller phrase technology. Any transcriptional errors that result  from this process are unintentional and may not be corrected upon review.  I spent 14 minutes examining this patient, reviewing medications, and using patient centered shared decision making involving her cardiac care.  Prior to her visit I spent greater than 20 minutes reviewing her past medical history,  medications, and prior cardiac tests.

## 2020-10-31 NOTE — Patient Instructions (Signed)
Medication Instructions:  The current medical regimen is effective;  continue present plan and medications as directed. Please refer to the Current Medication list given to you today.  *If you need a refill on your cardiac medications before your next appointment, please call your pharmacy*  Lab Work: BMET TODAY If you have labs (blood work) drawn today and your tests are completely normal, you will receive your results only by:  [place] (if you have MyChart) OR A paper copy in the mail.  If you have any lab test that is abnormal or we need to change your treatment, we will call you to review the results. You may go to any [facility] that is convenient for you however, we do have a lab in our office that is able to assist you. You DO NOT need an appointment for our lab. The lab is open 8:00am and closes at 4:00pm. Lunch 12:45 - 1:45pm.  Special Instructions RECOMMENDATION FOR PCP: Raymond J. de [place], MD [address] [place], [place], [place] [address]  Phone: [phone]  PLEASE READ AND FOLLOW SALTY 6-ATTACHED-1,800 mg daily  PLEASE READ AND USE SLEEP TIPS-ATTACHED  Follow-Up: Your next appointment:  1 month(s) In Person with Quay Burow, MD  IF UNAVAILABLE Terie Purser, NP-C OR JESSE CLEAVER, FNP-C   At [facility], you and your health needs are our priority.  As part of our continuing mission to provide you with exceptional heart care, we have created designated Provider Care Teams.  These Care Teams include your primary Cardiologist (physician) and Advanced Practice Providers (APPs -  Physician Assistants and Nurse Practitioners) who all work together to provide you with the care you need, when you need it.         Quality Sleep Information, Adult Quality sleep is important for your mental and physical health. It also improves your quality of life. Quality sleep means you: Are asleep for most of the time you are in bed. Fall asleep within 30 minutes. Wake up no  more than once a night.  Are awake for no longer than 20 minutes if you do wake up during the night. Most adults need 7-8 hours of quality sleep each night. How can poor sleep affect me? If you do not get enough quality sleep, you may have: Mood swings. Daytime sleepiness. Confusion. Decreased reaction time. Sleep disorders, such as insomnia and sleep apnea. Difficulty with: Solving problems. Coping with stress. Paying attention. These issues may affect your performance and productivity at work, school, and at home. Lack of sleep may also put you at higher risk for accidents, suicide, and risky behaviors. If you do not get quality sleep you may also be at higher risk for several health problems, including: Infections. Type 2 diabetes. Heart disease. High blood pressure. Obesity. Worsening of long-term conditions, like arthritis, kidney disease, depression, Parkinson's disease, and epilepsy. What actions can I take to get more quality sleep?   Stick to a sleep schedule. Go to sleep and wake up at about the same time each day. Do not try to sleep less on weekdays and make up for lost sleep on weekends. This does not work. Try to get about 30 minutes of exercise on most days. Do not exercise 2-3 hours before going to bed. Limit naps during the day to 30 minutes or less. Do not use any products that contain nicotine or tobacco, such as cigarettes or e-cigarettes. If you need help quitting, ask your health care provider. Do not drink  caffeinated beverages for at least 8 hours before going to bed. Coffee, tea, and some sodas contain caffeine. Do not drink alcohol close to bedtime. Do not eat large meals close to bedtime. Do not take naps in the late afternoon. Try to get at least 30 minutes of sunlight every day. Morning sunlight is best. Make time to relax before bed. Reading, listening to music, or taking a hot bath promotes quality sleep. Make your bedroom a place that promotes quality  sleep. Keep your bedroom dark, quiet, and at a comfortable room temperature. Make sure your bed is comfortable. Take out sleep distractions like TV, a computer, smartphone, and bright lights. If you are lying awake in bed for longer than 20 minutes, get up and do a relaxing activity until you feel sleepy. Work with your health care provider to treat medical conditions that may affect sleeping, such as: Nasal obstruction. Snoring. Sleep apnea and other sleep disorders. Talk to your health care provider if you think any of your prescription medicines may cause you to have difficulty falling or staying asleep. If you have sleep problems, talk with a sleep consultant. If you think you have a sleep disorder, talk with your health care provider about getting evaluated by a specialist. Where to find more information [place] website: https://sleepfoundation.org National Heart, Lung, and [place] ([place]): http://www.saunders.info/.pdf Centers for Disease Control and Prevention (CDC): LearningDermatology.pl Contact a health care provider if you: Have trouble getting to sleep or staying asleep. Often wake up very early in the morning and cannot get back to sleep. Have daytime sleepiness. Have daytime sleep attacks of suddenly falling asleep and sudden muscle weakness (narcolepsy). Have a tingling sensation in your legs with a strong urge to move your legs (restless legs syndrome). Stop breathing briefly during sleep (sleep apnea). Think you have a sleep disorder or are taking a medicine that is affecting your quality of sleep. Summary Most adults need 7-8 hours of quality sleep each night. Getting enough quality sleep is an important part of health and well-being. Make your bedroom a place that promotes quality sleep and avoid things that may cause you to have poor sleep, such as alcohol, caffeine, smoking, and large meals. Talk to your health  care provider if you have trouble falling asleep or staying asleep. This information is not intended to replace advice given to you by your health care provider. Make sure you discuss any questions you have with your health care provider. Document Revised: 05/01/2017 Document Reviewed: 05/01/2017 Elsevier Patient Education  [place].

## 2020-11-01 LAB — BASIC METABOLIC PANEL
BUN/Creatinine Ratio: 20 (ref 9–20)
BUN: 21 mg/dL (ref 6–24)
CO2: 22 mmol/L (ref 20–29)
Calcium: 9.1 mg/dL (ref 8.7–10.2)
Chloride: 106 mmol/L (ref 96–106)
Creatinine, Ser: 1.06 mg/dL (ref 0.76–1.27)
Glucose: 82 mg/dL (ref 70–99)
Potassium: 4.5 mmol/L (ref 3.5–5.2)
Sodium: 146 mmol/L — ABNORMAL HIGH (ref 134–144)
eGFR: 86 mL/min/{1.73_m2} (ref >59)

## 2020-11-08 ENCOUNTER — Ambulatory Visit: Payer: 59 | Admitting: Cardiovascular Disease

## 2020-11-08 ENCOUNTER — Other Ambulatory Visit: Payer: Self-pay

## 2020-11-08 ENCOUNTER — Encounter: Payer: Self-pay | Admitting: Physician Assistant

## 2020-11-08 ENCOUNTER — Ambulatory Visit: Payer: 59 | Admitting: Physician Assistant

## 2020-11-08 VITALS — BP 148/82 | HR 96 | Resp 20 | Ht 72.0 in | Wt 272.0 lb

## 2020-11-08 DIAGNOSIS — S21109A Unspecified open wound of unspecified front wall of thorax without penetration into thoracic cavity, initial encounter: Secondary | ICD-10-CM

## 2020-11-08 NOTE — Progress Notes (Signed)
[address]       [place] [address]             [phone]    Roberto Morrison is a 50 y.o. 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 Orvan Morrison. He had a radial artery harvest and bilateral internal mammary harvests. He was discharged on asa/plavix. He did follow-up with Cardiology on 7/11 and an echocardiogram is recommended for next month. He saw a PA in our office shortly after and had some sternal drainage and redness around the lower portion of his sternal incision. He has followed up a few times in our office for wound checks and was started on Keflex.   On 8/12, he contacted myself who was on-call for our service to report he had more swelling, erythema, and drainage from the lower sternal wound. He was in [place] at the time.  He said he used a sterile needle to puncture a raised area at the lower end of the sternal incision and he was able to express some yellow fluid. We arranged to see him in our office that following Monday, 8/15 He denied any fever, chills, or any sensation of movement in his sternum.   We referred him to Dr. Marla Morrison and he saw her on 8/16. He had a CT scan which showed a superficial infection with wires intact. No abscess or fluid collection noted (full report below) .  A wound culture was obtained in our office which showed Staph aureus and Dr. Cyndia Morrison wrote for Keflex 500 mg twice daily x7 days.   He returned for a wound check later in August and we instructed him to keep his incisions clean and dry. We encouraged him to pack with iodoform packing and keep to his sternal precautions.   Today, he presents today for a wound check. He still has a small area mid incision that is having a difficult time healing. Otherwise, he has had no new issues.     1. Wound of sternal region    No past medical history on file. No past surgical history pertinent negatives on file. Scheduled Meds: Current Outpatient Medications on File Prior to  Visit  Medication Sig Dispense Refill   aspirin EC 81 MG EC tablet Take 1 tablet (81 mg total) by mouth daily. Swallow whole. 30 tablet 11   atorvastatin (LIPITOR) 80 MG tablet Take 1 tablet (80 mg total) by mouth daily. 90 tablet 1   cephALEXin (KEFLEX) 500 MG capsule Take 1 capsule (500 mg total) by mouth 2 (two) times daily. 14 capsule 0   clopidogrel (PLAVIX) 75 MG tablet Take 1 tablet (75 mg total) by mouth daily. 90 tablet 3   empagliflozin (JARDIANCE) 10 MG TABS tablet Take 1 tablet (10 mg total) by mouth daily before breakfast. 90 tablet 1   metoprolol succinate (TOPROL XL) 50 MG 24 hr tablet Take 1 tablet (50 mg total) by mouth daily. Take with or immediately following a meal. 90 tablet 3   zolpidem (AMBIEN CR) 12.5 MG CR tablet Take 1 tablet (12.5 mg total) by mouth at bedtime as needed for sleep. 15 tablet 0   No current facility-administered medications on file prior to visit.     Active Problems:   * No active hospital problems. *   Vitals:   11/08/20 1057  BP: (!) 148/82  Pulse: 96  Resp: 20  SpO2: 95%     Physical Exam:  Wound: small 0.25 cm x 0.25 cm area mid sternum that is open without drainage.    CLINICAL DATA:  Wound dehiscence status post coronary bypass graft.   EXAM: CT CHEST WITHOUT CONTRAST   TECHNIQUE: Multidetector CT imaging of the chest was performed following the standard protocol without IV contrast.   COMPARISON:  August 09, 2020.   FINDINGS: Cardiovascular: No evidence of thoracic aortic aneurysm. Status post coronary bypass graft. Normal cardiac size. Small pericardial effusion is noted.   Mediastinum/Nodes: No enlarged mediastinal or axillary lymph nodes. Thyroid gland, trachea, and esophagus demonstrate no significant findings.   Lungs/Pleura: No pneumothorax or pleural effusion is noted. Left lung is clear. Atelectasis or scarring is noted anteriorly in the right upper lobe.   Upper Abdomen: No acute abnormality.    Musculoskeletal: No fracture is noted. Sternotomy wires are intact. No fluid collection or abscess is Morrison in the sternal region. Stranding of the subcutaneous tissues is Morrison overlying the sternum suggesting edema or possibly inflammation such as cellulitis.   IMPRESSION:  Sternotomy wires are intact. No definite fluid collection or abscess is Morrison in the sternal region. Stranding of the subcutaneous tissues is Morrison overlying the sternum suggesting edema or possibly inflammation such as cellulitis.   Small pericardial effusion is noted.   Atelectasis or scarring is Morrison anteriorly in the right upper lobe.     Electronically Signed   By: Roberto Conception M.D.   On: 09/20/2020 15:39    Assessment & Plan  Superficial wound dehiscence-likely a suture reaction with localized infection.  Cultures originally grew staph aureus. He has been on Keflex intermittently for 3 weeks.  He no longer has any drainage and the area appears clear and dry. His hole is mid sternum and abou 0.25 cm x 0.25 cm with no tracking. This is slightly smaller than last time. It is slow to heal   He has some scar tissue right above the opening which is hard to touch. It does not seem like fluid or a lipoma. It does bother him and he wanted to discuss cutting it out with a Psychologist, sport and exercise.  He knows to maintain sternal precautions.  Plan: Follow-up in 2 week for wound check with an available surgeon. He wants to discuss the scar tissue and if removal is possible at the bedside with local anesthetic. Encouraged to continue packing with 1/4 inch ribbon after showering once daily.   Roberto Morrison 11/08/2020

## 2020-11-11 ENCOUNTER — Ambulatory Visit: Payer: 59 | Admitting: Physician Assistant

## 2020-11-11 ENCOUNTER — Telehealth: Payer: Self-pay | Admitting: *Deleted

## 2020-11-11 ENCOUNTER — Other Ambulatory Visit: Payer: Self-pay | Admitting: *Deleted

## 2020-11-11 ENCOUNTER — Other Ambulatory Visit: Payer: Self-pay

## 2020-11-11 VITALS — BP 101/70 | HR 83 | Resp 20 | Ht 72.0 in | Wt 272.0 lb

## 2020-11-11 DIAGNOSIS — S21109A Unspecified open wound of unspecified front wall of thorax without penetration into thoracic cavity, initial encounter: Secondary | ICD-10-CM

## 2020-11-11 DIAGNOSIS — Z951 Presence of aortocoronary bypass graft: Secondary | ICD-10-CM

## 2020-11-11 NOTE — Telephone Encounter (Signed)
Patient contacted the office with concerns over a spot on his sternal incision that has gotten larger, softer, and red in color since his office visit on 10/4. Patient states his other wounds are well healed without any drainage or other signs of infection. Patient has a f/u appt with Dr. Cyndia Bent scheduled for 10/19 but wishes to be seen today for evaluation. Appt scheduled with PA today. Patient aware.

## 2020-11-11 NOTE — Progress Notes (Signed)
Called [facility] lab pick up services. Spoke with [employer]. Confirmation number [id number].

## 2020-11-11 NOTE — Progress Notes (Signed)
[address]       [place] [address]             [phone]    Roberto Morrison is a 50 y.o. 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 Orvan Seen. He had a radial artery harvest and bilateral internal mammary harvests. He was discharged on asa/plavix. He did follow-up with Cardiology on 7/11 and an echocardiogram is recommended for next month. He saw a PA in our office shortly after and had some sternal drainage and redness around the lower portion of his sternal incision. He has followed up a few times in our office for wound checks and was started on Keflex.   On 8/12, he contacted myself who was on-call for our service to report he had more swelling, erythema, and drainage from the lower sternal wound. He was in [place] at the time.  He said he used a sterile needle to puncture a raised area at the lower end of the sternal incision and he was able to express some yellow fluid. We arranged to see him in our office that following Monday, 8/15 He denied any fever, chills, or any sensation of movement in his sternum.   We referred him to Dr. Marla Roe and he saw her on 8/16. He had a CT scan which showed a superficial infection with wires intact. No abscess or fluid collection noted (full report below) .  A wound culture was obtained in our office which showed Staph aureus and Dr. Cyndia Bent wrote for Keflex 500 mg twice daily x7 days.   He returned for a wound check later in August and we instructed him to keep his incisions clean and dry. We encouraged him to pack with iodoform packing and keep to his sternal precautions.   Today, he presents today for a wound check. He does have a fluctuant aspect of his incision now at the superior aspect of the incision. He did try to express drainage himself with a needle.     1. Wound of sternal region    No past medical history on file. No past surgical history pertinent negatives on file. Scheduled Meds: Current Outpatient  Medications on File Prior to Visit  Medication Sig Dispense Refill   aspirin EC 81 MG EC tablet Take 1 tablet (81 mg total) by mouth daily. Swallow whole. 30 tablet 11   atorvastatin (LIPITOR) 80 MG tablet Take 1 tablet (80 mg total) by mouth daily. 90 tablet 1   cephALEXin (KEFLEX) 500 MG capsule Take 1 capsule (500 mg total) by mouth 2 (two) times daily. 14 capsule 0   clopidogrel (PLAVIX) 75 MG tablet Take 1 tablet (75 mg total) by mouth daily. 90 tablet 3   empagliflozin (JARDIANCE) 10 MG TABS tablet Take 1 tablet (10 mg total) by mouth daily before breakfast. 90 tablet 1   metoprolol succinate (TOPROL XL) 50 MG 24 hr tablet Take 1 tablet (50 mg total) by mouth daily. Take with or immediately following a meal. 90 tablet 3   zolpidem (AMBIEN CR) 12.5 MG CR tablet Take 1 tablet (12.5 mg total) by mouth at bedtime as needed for sleep. 15 tablet 0   No current facility-administered medications on file prior to visit.     Active Problems:   * No active hospital problems. *   Vitals:   11/08/20 1057  BP: (!) 148/82  Pulse: 96  Resp: 20  SpO2: 95%  Physical Exam:   Wound: Opened and now the new section is 1cm deep x 1.5cm superior x 0.5cm  inferior  The other area is almost completely healed.   CV- NSR, no murmur Pulm-CTA bilaterally and in all fields Ext- no edema   CLINICAL DATA:  Wound dehiscence status post coronary bypass graft.   EXAM: CT CHEST WITHOUT CONTRAST   TECHNIQUE: Multidetector CT imaging of the chest was performed following the standard protocol without IV contrast.   COMPARISON:  August 09, 2020.   FINDINGS: Cardiovascular: No evidence of thoracic aortic aneurysm. Status post coronary bypass graft. Normal cardiac size. Small pericardial effusion is noted.   Mediastinum/Nodes: No enlarged mediastinal or axillary lymph nodes. Thyroid gland, trachea, and esophagus demonstrate no significant findings.   Lungs/Pleura: No pneumothorax or pleural  effusion is noted. Left lung is clear. Atelectasis or scarring is noted anteriorly in the right upper lobe.   Upper Abdomen: No acute abnormality.   Musculoskeletal: No fracture is noted. Sternotomy wires are intact. No fluid collection or abscess is seen in the sternal region. Stranding of the subcutaneous tissues is seen overlying the sternum suggesting edema or possibly inflammation such as cellulitis.   IMPRESSION:  Sternotomy wires are intact. No definite fluid collection or abscess is seen in the sternal region. Stranding of the subcutaneous tissues is seen overlying the sternum suggesting edema or possibly inflammation such as cellulitis.   Small pericardial effusion is noted.   Atelectasis or scarring is seen anteriorly in the right upper lobe.     Electronically Signed   By: Marijo Conception M.D.   On: 09/20/2020 15:39    Assessment & Plan  Superficial wound dehiscence-likely a suture reaction with localized infection.  Cultures originally grew staph aureus. He has been on Keflex intermittently for 3 weeks.  Re-cultured the new area. It was opened about 1cm and a pocket of mostly blood was found. It was packed with 1/4 inch iodoform packing and covered with 2 x 2 dressing and tape. Continue sternal precautions.   Plan: Follow-up next week for wound check.   [name] 11/11/2020

## 2020-11-14 ENCOUNTER — Other Ambulatory Visit: Payer: Self-pay | Admitting: Physician Assistant

## 2020-11-14 DIAGNOSIS — Z006 Encounter for examination for normal comparison and control in clinical research program: Secondary | ICD-10-CM

## 2020-11-14 MED ORDER — SULFAMETHOXAZOLE-TRIMETHOPRIM 800-160 MG PO TABS: 1.0000 | ORAL_TABLET | Freq: Two times a day (BID) | ORAL | 0 refills | Status: DC

## 2020-11-14 NOTE — Progress Notes (Signed)
      [address]       [place] [address]             [phone]       Spoke with Roberto Morrison on the phone about his sternal culture results. His cultures showed staph aureus so I called in Bactrim for him to take for a week.   He can't pick up his prescription until Wednesday since he is out of town.   He will follow-up here on Thursday.   Nicholes Rough, PA-C

## 2020-11-14 NOTE — Research (Signed)
Called to discuss Lpa with patient with no answer. Voice message left for a return call.

## 2020-11-17 ENCOUNTER — Ambulatory Visit: Payer: 59 | Admitting: Physician Assistant

## 2020-11-17 ENCOUNTER — Encounter: Payer: Self-pay | Admitting: Physician Assistant

## 2020-11-17 ENCOUNTER — Other Ambulatory Visit: Payer: Self-pay

## 2020-11-17 VITALS — BP 132/71 | HR 79 | Resp 20 | Ht 72.0 in

## 2020-11-17 DIAGNOSIS — Z5189 Encounter for other specified aftercare: Secondary | ICD-10-CM | POA: Diagnosis not present

## 2020-11-17 NOTE — Progress Notes (Signed)
[address]       [place] [address]             [phone]    Roberto Morrison is a 50 y.o. 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 Orvan Seen. He had a radial artery harvest and bilateral internal mammary harvests. He was discharged on asa/plavix. He did follow-up with Cardiology on 7/11 and an echocardiogram is recommended for next month. He saw a PA in our office shortly after and had some sternal drainage and redness around the lower portion of his sternal incision. He has followed up a few times in our office for wound checks and was started on Keflex.   On 8/12, he contacted myself who was on-call for our service to report he had more swelling, erythema, and drainage from the lower sternal wound. He was in [place] at the time.  He said he used a sterile needle to puncture a raised area at the lower end of the sternal incision and he was able to express some yellow fluid. We arranged to see him in our office that following Monday, 8/15 He denied any fever, chills, or any sensation of movement in his sternum.   We referred him to Dr. Marla Roe and he saw her on 8/16. He had a CT scan which showed a superficial infection with wires intact. No abscess or fluid collection noted (full report below) .  A wound culture was obtained in our office which showed Staph aureus and Dr. Cyndia Bent wrote for Keflex 500 mg twice daily x7 days.   He returned for a wound check later in August and we instructed him to keep his incisions clean and dry. We encouraged him to pack with iodoform packing and keep to his sternal precautions.   He presented to the office on 10/7 and we opened a fluctuant portion of the incision. A pocket of blood was expressed but no purulent drainage. This drainage was cultures and grew staph aureus. He was prescribed Bactrim and was able to pick up the prescription yesterday and started his antibiotics. He presents today for a wound check.    Today, he has no  complaints. He has been packing his incision daily. He picked up his antibiotics yesterday.     1. Wound of sternal region    No past medical history on file. No past surgical history pertinent negatives on file. Scheduled Meds: Current Outpatient Medications on File Prior to Visit  Medication Sig Dispense Refill   aspirin EC 81 MG EC tablet Take 1 tablet (81 mg total) by mouth daily. Swallow whole. 30 tablet 11   atorvastatin (LIPITOR) 80 MG tablet Take 1 tablet (80 mg total) by mouth daily. 90 tablet 1   cephALEXin (KEFLEX) 500 MG capsule Take 1 capsule (500 mg total) by mouth 2 (two) times daily. 14 capsule 0   clopidogrel (PLAVIX) 75 MG tablet Take 1 tablet (75 mg total) by mouth daily. 90 tablet 3   empagliflozin (JARDIANCE) 10 MG TABS tablet Take 1 tablet (10 mg total) by mouth daily before breakfast. 90 tablet 1   metoprolol succinate (TOPROL XL) 50 MG 24 hr tablet Take 1 tablet (50 mg total) by mouth daily. Take with or immediately following a meal. 90 tablet 3   zolpidem (AMBIEN CR) 12.5 MG CR tablet Take 1 tablet (12.5 mg total) by mouth at bedtime as needed for sleep. 15 tablet 0   No current  facility-administered medications on file prior to visit.     Active Problems:   * No active hospital problems. *  Vitals:   11/17/20 1407  BP: 132/71  Pulse: 79  Resp: 20  SpO2: 98%       Physical Exam:   Wound: Opened and now the new section is 0.5cm deep x 0.5cm superior x 0cm  inferior  CLINICAL DATA:  Wound dehiscence status post coronary bypass graft.   EXAM: CT CHEST WITHOUT CONTRAST   TECHNIQUE: Multidetector CT imaging of the chest was performed following the standard protocol without IV contrast.   COMPARISON:  August 09, 2020.   FINDINGS: Cardiovascular: No evidence of thoracic aortic aneurysm. Status post coronary bypass graft. Normal cardiac size. Small pericardial effusion is noted.   Mediastinum/Nodes: No enlarged mediastinal or axillary lymph  nodes. Thyroid gland, trachea, and esophagus demonstrate no significant findings.   Lungs/Pleura: No pneumothorax or pleural effusion is noted. Left lung is clear. Atelectasis or scarring is noted anteriorly in the right upper lobe.   Upper Abdomen: No acute abnormality.   Musculoskeletal: No fracture is noted. Sternotomy wires are intact. No fluid collection or abscess is seen in the sternal region. Stranding of the subcutaneous tissues is seen overlying the sternum suggesting edema or possibly inflammation such as cellulitis.   IMPRESSION:  Sternotomy wires are intact. No definite fluid collection or abscess is seen in the sternal region. Stranding of the subcutaneous tissues is seen overlying the sternum suggesting edema or possibly inflammation such as cellulitis.   Small pericardial effusion is noted.   Atelectasis or scarring is seen anteriorly in the right upper lobe.     Electronically Signed   By: Marijo Conception M.D.   On: 09/20/2020 15:39    Assessment & Plan  Superficial wound dehiscence-likely a suture reaction with localized infection. He has been on Keflex intermittently for 3 weeks.  Re-cultured last week and grew staph aureus. He started Bactrim yesterday. The area he is packing is getting much smaller. Continue sternal precautions.   Plan: Follow-up next week for wound check. Continue antibiotics.   [name] 11/17/2020

## 2020-11-23 ENCOUNTER — Ambulatory Visit: Payer: 59 | Admitting: Surgery

## 2020-11-24 ENCOUNTER — Other Ambulatory Visit: Payer: Self-pay

## 2020-11-24 ENCOUNTER — Ambulatory Visit (INDEPENDENT_AMBULATORY_CARE_PROVIDER_SITE_OTHER): Payer: 59 | Admitting: Physician Assistant

## 2020-11-24 VITALS — BP 134/82 | HR 82 | Resp 20 | Ht 72.0 in | Wt 275.0 lb

## 2020-11-24 DIAGNOSIS — Z951 Presence of aortocoronary bypass graft: Secondary | ICD-10-CM

## 2020-11-24 DIAGNOSIS — S21109A Unspecified open wound of unspecified front wall of thorax without penetration into thoracic cavity, initial encounter: Secondary | ICD-10-CM

## 2020-11-24 NOTE — Progress Notes (Signed)
[address]       [place] [address]             [phone]    Roberto Morrison is a 50 y.o. 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 Orvan Morrison. He had a radial artery harvest and bilateral internal mammary harvests. He was discharged on asa/plavix. He did follow-up with Cardiology on 7/11 and an echocardiogram is recommended for next month. He saw a PA in our office shortly after and had some sternal drainage and redness around the lower portion of his sternal incision. He has followed up a few times in our office for wound checks and was started on Keflex.   On 8/12, he contacted myself who was on-call for our service to report he had more swelling, erythema, and drainage from the lower sternal wound. He was in [place] at the time.  He said he used a sterile needle to puncture a raised area at the lower end of the sternal incision and he was able to express some yellow fluid. We arranged to see him in our office that following Monday, 8/15 He denied any fever, chills, or any sensation of movement in his sternum.   We referred him to Dr. Marla Morrison and he saw her on 8/16. He had a CT scan which showed a superficial infection with wires intact. No abscess or fluid collection noted (full report below) .  A wound culture was obtained in our office which showed Staph aureus and Dr. Cyndia Morrison wrote for Keflex 500 mg twice daily x7 days.   He returned for a wound check later in August and we instructed him to keep his incisions clean and dry. We encouraged him to pack with iodoform packing and keep to his sternal precautions.   He presented to the office on 10/7 and we opened a fluctuant portion of the incision. A pocket of blood was expressed but no purulent drainage. This drainage was cultures and grew staph aureus. He was prescribed Bactrim and was able to pick up the prescription yesterday and started his antibiotics. He presents today for a wound check.    Today, he has no  complaints. He finished his antibiotics on Tuesday and he states he went to bed with packing in place and a Band-Aid over top and when he woke up he could not find the packing anywhere. It was not inside his incision so his best guess is that it fell out through the night.  He states that his incision has scabbed over since then and he has not been able to pack the area.    1. Wound of sternal region    No past medical history on file. No past surgical history pertinent negatives on file. Scheduled Meds: Current Outpatient Medications on File Prior to Visit  Medication Sig Dispense Refill   aspirin EC 81 MG EC tablet Take 1 tablet (81 mg total) by mouth daily. Swallow whole. 30 tablet 11   atorvastatin (LIPITOR) 80 MG tablet Take 1 tablet (80 mg total) by mouth daily. 90 tablet 1   clopidogrel (PLAVIX) 75 MG tablet Take 1 tablet (75 mg total) by mouth daily. 90 tablet 3   empagliflozin (JARDIANCE) 10 MG TABS tablet Take 1 tablet (10 mg total) by mouth daily before breakfast. 90 tablet 1   metoprolol succinate (TOPROL XL) 50 MG 24 hr tablet Take 1 tablet (50 mg total) by mouth daily. Take with or  immediately following a meal. 90 tablet 3   sacubitril-valsartan (ENTRESTO) 24-26 MG Take 1 tablet by mouth 2 (two) times daily. 60 tablet 11   sulfamethoxazole-trimethoprim (BACTRIM DS) 800-160 MG tablet Take 1 tablet by mouth 2 (two) times daily. 14 tablet 0   No current facility-administered medications on file prior to visit.       Active Problems:   * No active hospital problems. *  Vitals:   11/17/20 1407  BP: 132/71  Pulse: 79  Resp: 20  SpO2: 98%       Physical Exam:   CV-NSR, no murmur Pulm: CTA bilaterally and in all fields Ext: no edema  Wound: completely healed, picture below       CLINICAL DATA:  Wound dehiscence status post coronary bypass graft.   EXAM: CT CHEST WITHOUT CONTRAST   TECHNIQUE: Multidetector CT imaging of the chest was performed following  the standard protocol without IV contrast.   COMPARISON:  August 09, 2020.   FINDINGS: Cardiovascular: No evidence of thoracic aortic aneurysm. Status post coronary bypass graft. Normal cardiac size. Small pericardial effusion is noted.   Mediastinum/Nodes: No enlarged mediastinal or axillary lymph nodes. Thyroid gland, trachea, and esophagus demonstrate no significant findings.   Lungs/Pleura: No pneumothorax or pleural effusion is noted. Left lung is clear. Atelectasis or scarring is noted anteriorly in the right upper lobe.   Upper Abdomen: No acute abnormality.   Musculoskeletal: No fracture is noted. Sternotomy wires are intact. No fluid collection or abscess is Morrison in the sternal region. Stranding of the subcutaneous tissues is Morrison overlying the sternum suggesting edema or possibly inflammation such as cellulitis.   IMPRESSION:  Sternotomy wires are intact. No definite fluid collection or abscess is Morrison in the sternal region. Stranding of the subcutaneous tissues is Morrison overlying the sternum suggesting edema or possibly inflammation such as cellulitis.   Small pericardial effusion is noted.   Atelectasis or scarring is Morrison anteriorly in the right upper lobe.     Electronically Signed   By: Roberto Morrison M.D.   On: 09/20/2020 15:39    Assessment & Plan  Superficial wound dehiscence-likely a suture reaction with localized infection. He has been on Keflex intermittently for 3 weeks.  Re-cultured last week and grew staph aureus. He started Bactrim last week and finished his last dose Tuesday of this week.  The area is well healed without any remaining open spots. He does have some scar tissue present along the incision and I think it would be okay at this point to massage the area to break up the scar tissue.  He has been referred to a PCP and I gave him the number again today. He plans to call and make an appointment to get set up through their office.   Plan:  Follow-up PRN. Wound looks completely healed today without any open areas. Picture is above. He can call us in the future with questions or concerns.   Roberto Morrison 11/24/2020

## 2020-11-27 NOTE — Progress Notes (Signed)
Cardiology Clinic Note   Patient Name: Roberto Morrison Date of Encounter: 11/29/2020  Primary Care Provider:  Patient, No Pcp Per (Inactive) Primary Cardiologist:  Quay Burow, MD  Patient Profile    Roberto Morrison 50 year old male presents to the clinic today for review of his echocardiogram and systolic CHF.  Past Medical History    No past medical history on file. Past Surgical History:  Procedure Laterality Date   CORONARY ARTERY BYPASS GRAFT N/A 07/26/2020   Procedure: CORONARY ARTERY BYPASS GRAFTING (CABG) X THREE, USING LEFT INTERNAL MAMMARY ARTERY< RIGHT INTERNAL MAMMARY ARTERY AND LEFT RADIAL ARTERY CONDUITS.;  Surgeon: Wonda Olds, MD;  Location: [place];  Service: Open Heart Surgery;  Laterality: N/A;   INTERCOSTAL NERVE BLOCK  07/26/2020   Procedure: INTERCOSTAL NERVE BLOCK;  Surgeon: Wonda Olds, MD;  Location: [place] [facility];  Service: Open Heart Surgery;;   LEFT HEART CATH AND CORONARY ANGIOGRAPHY N/A 07/22/2020   Procedure: LEFT HEART CATH AND CORONARY ANGIOGRAPHY;  Surgeon: Lorretta Harp, MD;  Location: [facility] [facility];  Service: Cardiovascular;  Laterality: N/A;   RADIAL ARTERY HARVEST Left 07/26/2020   Procedure: RADIAL ARTERY HARVEST;  Surgeon: Wonda Olds, MD;  Location: [place];  Service: Open Heart Surgery;  Laterality: Left;   TEE WITHOUT CARDIOVERSION N/A 07/26/2020   Procedure: TRANSESOPHAGEAL ECHOCARDIOGRAM (TEE);  Surgeon: Wonda Olds, MD;  Location: [facility];  Service: Open Heart Surgery;  Laterality: N/A;    Allergies  Allergies  Allergen Reactions   Prednisone Other (See Comments)    "Hiccups"     History of Present Illness    [name] has a PMH of coronary artery disease status post CABG 6/22, ischemic cardiomyopathy, and hyperlipidemia.   He presented to the emergency department 6/22 with chest discomfort that occurred with increased physical activity.  He underwent cardiac catheterization which showed severe coronary  artery disease in his LAD and circumflex vessels.  He was also noted to have borderline disease in his RCA.  His echocardiogram showed an LVEF of 30-35%.  He underwent CABG x3 on 07/26/2020 LIMA-LAD, RIMA-PDA, left radial-obtuse marginal of circumflex.  He was also prescribed oral nitrates to optimize his arterial graft patency.  He progressed well postoperatively and his postoperative course was unremarkable.   He was seen by Laurann Montana, PA-C on 08/15/2020.  During that time he denied exertional chest discomfort.  He did note some incisional discomfort with laying on that side.  His pain resolved with laying on his back.  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  He reported that he had returned to some of that his gym activities.  He denied dyspnea with increased exertion and reported that he was maintaining sternal precautions.   He presented to the clinic 10/31/2020 for follow-up evaluation and to review his echocardiogram.  He stated he felt well.  He did have some increased stress related to a relationship/girlfriend.  He reported he felt he was recovering better with less stress.  He continued to be very physically active walking 30 minutes to 60 minutes 3 to 5 days/week he also continues to lift/resistance training 3 to 5 days/week.  He reported compliance with his medications.  We reviewed his current medication list.  He denies side effects.  He was tolerating his Entresto well.  I  ordered a BMP.  We discussed the progression/titration of the medication and reviewed his recent echocardiogram.  I  planned follow-up in 4 weeks.  I  gave him the sleep hygiene information and information for PCP as well.  He presents to the clinic today for follow-up evaluation states he feels well.  He continues to be very physically active at the gym.  His blood pressure today is 112/80 and his pulses 101.  I will stop his metoprolol and start him on carvedilol  6.25 mg twice daily.  We will continue his other medications, order a BMP, and maintain his current diet and have him follow-up in 2 to 4 weeks.   Today he denies chest pain, shortness of breath, lower extremity edema, fatigue, palpitations, melena, hematuria, hemoptysis, diaphoresis, weakness, presyncope, syncope, orthopnea, and PND.  Home Medications    Prior to Admission medications   Medication Sig Start Date End Date Taking? Authorizing Provider  aspirin EC 81 MG EC tablet Take 1 tablet (81 mg total) by mouth daily. Swallow whole. 07/31/20   Antony Odea, PA-C  atorvastatin (LIPITOR) 80 MG tablet Take 1 tablet (80 mg total) by mouth daily. 08/15/20   Loel Dubonnet, NP  clopidogrel (PLAVIX) 75 MG tablet Take 1 tablet (75 mg total) by mouth daily. 08/15/20   Loel Dubonnet, NP  empagliflozin (JARDIANCE) 10 MG TABS tablet Take 1 tablet (10 mg total) by mouth daily before breakfast. 08/15/20   Loel Dubonnet, NP  metoprolol succinate (TOPROL XL) 50 MG 24 hr tablet Take 1 tablet (50 mg total) by mouth daily. Take with or immediately following a meal. 08/15/20 08/15/21  Loel Dubonnet, NP  sacubitril-valsartan (ENTRESTO) 24-26 MG Take 1 tablet by mouth 2 (two) times daily. 10/19/20   Loel Dubonnet, NP  sulfamethoxazole-trimethoprim (BACTRIM DS) 800-160 MG tablet Take 1 tablet by mouth 2 (two) times daily. 11/14/20   [name], PA-C    Family History    No family history on file. has no family status information on file.   Social History    Social History   Socioeconomic History   Marital status: Single    Spouse name: Not on file   Number of children: Not on file   Years of education: Not on file   Highest education level: Not on file  Occupational History   Not on file  Tobacco Use   Smoking status: Never   Smokeless tobacco: Never  Substance and Sexual Activity   Alcohol use: Not Currently   Drug use: Never   Sexual activity: Not on file  Other Topics  Concern   Not on file  Social History Narrative   Not on file   Social Determinants of Health   Financial Resource Strain: Not on file  Food Insecurity: Not on file  Transportation Needs: Not on file  Physical Activity: Not on file  Stress: Not on file  Social Connections: Not on file  Intimate Partner Violence: Not on file     Review of Systems    General:  No chills, fever, night sweats or weight changes.  Cardiovascular:  No chest pain, dyspnea on exertion, edema, orthopnea, palpitations, paroxysmal nocturnal dyspnea. Dermatological: No rash, lesions/masses Respiratory: No cough, dyspnea Urologic: No hematuria, dysuria Abdominal:   No nausea, vomiting, diarrhea, bright red blood per rectum, melena, or hematemesis Neurologic:  No visual changes, wkns, changes in mental status. All other systems reviewed and are otherwise negative except as noted above.  Physical Exam    VS:  BP 112/80   Pulse (!) 101   Ht 6' (1.829  m)   Wt 268 lb 12.8 oz (121.9 kg)   SpO2 95%   BMI 36.46 kg/m  , BMI Body mass index is 36.46 kg/m. GEN: Well nourished, well developed, in no acute distress. HEENT: normal. Neck: Supple, no JVD, carotid bruits, or masses. Cardiac: RRR, no murmurs, rubs, or gallops. No clubbing, cyanosis, edema.  Radials/DP/PT 2+ and equal bilaterally.  Respiratory:  Respirations regular and unlabored, clear to auscultation bilaterally. GI: Soft, nontender, nondistended, BS + x 4. MS: no deformity or atrophy. Skin: warm and dry, no rash. Neuro:  Strength and sensation are intact. Psych: Normal affect.  Accessory Clinical Findings    Recent Labs: 07/27/2020: Magnesium 2.2 09/12/2020: ALT 35; Hemoglobin 16.5; Platelets 216 10/31/2020: BUN 21; Creatinine, Ser 1.06; Potassium 4.5; Sodium 146   Recent Lipid Panel    Component Value Date/Time   CHOL 113 09/12/2020 0816   TRIG 99 09/12/2020 0816   HDL 33 (L) 09/12/2020 0816   CHOLHDL 3.4 09/12/2020 0816   CHOLHDL 5.3  07/25/2020 0928   VLDL 14 07/25/2020 0928   LDLCALC 61 09/12/2020 0816    ECG personally reviewed by me today-none today.  Echocardiogram 07/22/2020 IMPRESSIONS     1. Left ventricular ejection fraction, by estimation, is 30 to 35%. The  left ventricle has moderately decreased function. The left ventricle  demonstrates regional wall motion abnormalities (see scoring  diagram/findings for description). Left ventricular   diastolic parameters are consistent with Grade I diastolic dysfunction  (impaired relaxation). There is severe hypokinesis of the left  ventricular, mid-apical anteroseptal wall.   2. Right ventricular systolic function is normal. The right ventricular  size is normal.   3. The mitral valve is normal in structure. Trivial mitral valve  regurgitation. No evidence of mitral stenosis.   4. The aortic valve is normal in structure. Aortic valve regurgitation is  not visualized. No aortic stenosis is present.   Cardiac catheterization 07/22/20   IMPRESSION: Mr. Semper was admitted with a non-STEMI.  He has three-vessel disease.  He has a long segmental mid LAD lesion and a moderately long mid AV groove circumflex lesion lesion with what appears to be a moderate though not significant distal RCA lesion.  His filling pressures were normal.  His EF looked preserved.  I believe he would be best served with coronary artery bypass grafting with bilateral IMA's.  Surgical consult has been called.  The sheath was removed and a TR band was placed on the right wrist to achieve patent hemostasis.  The patient left the lab in stable condition.  Plan will be to restart IV heparin 4 hours after sheath removal.   Quay Burow. MD, [facility] 07/22/2020 4:11 PM   Diagnostic Dominance: Right       Echocardiogram 10/18/2020   IMPRESSIONS     1. Left ventricular ejection fraction, by estimation, is 40 to 45%. The  left ventricle has mildly decreased function. The left ventricle   demonstrates regional wall motion abnormalities (suggestive of LAD  disease). There is severe asymmetric left  ventricular hypertrophy. Left ventricular diastolic parameters are  consistent with Grade I diastolic dysfunction (impaired relaxation). No  systolic anterior motion of the mitral valve.   2. Right ventricular systolic function is mildly reduced. The right  ventricular size is normal. Mildly increased right ventricular wall  thickness. Tricuspid regurgitation signal is inadequate for assessing PA  pressure.   3. The mitral valve is normal in structure. No evidence of mitral valve  regurgitation.   4.  The aortic valve is tricuspid. Aortic valve regurgitation is mild. No  aortic stenosis is present.   5. The inferior vena cava is normal in size with greater than 50%  respiratory variability, suggesting right atrial pressure of 3 mmHg.   Comparison(s): A prior study was performed on 07/22/20. LVEF has improved  with normalization of LV size.   Assessment & Plan   1.   HFrEF/ischemic cardiomyopathy-remains physically active.  Denies increased dyspnea with exertion.  Prior to CABG EF 30-35%, intraoperative LVEF 40-45%.  Echocardiogram 10/18/2020 showed EF 40-45% with G1 DD.  Blood pressure did not previously allow ACE ARB Arni. Continue Jardiance Start carvedilol 6.25 twice daily Stop metoprolol Continue Entresto 24-26  Order BMP today . Case discussed with Dr. Gwenlyn Found  Coronary artery disease-no recent episodes of arm neck back or chest discomfort.  Status post CABG x3 07/26/2020. Continue aspirin, atorvastatin, clopidogrel,  Start carvedilol Heart healthy low-sodium diet-salty 6 given Increase physical activity as tolerated   Hyperlipidemia-09/12/2020: Cholesterol, Total 113; HDL 33; LDL Chol Calc (NIH) 61; Triglycerides 99 Continue atorvastatin, aspirin Heart healthy low-sodium high-fiber diet Continue physical activity as tolerated   Disposition: Follow-up with Dr. Gwenlyn Found  or APP in 2-4 weeks.   Jossie Ng. Travaughn Vue NP-C    11/29/2020, 11:36 AM [address] Office [phone] Fax [phone]  Notice: This dictation was prepared with Dragon dictation along with smaller phrase technology. Any transcriptional errors that result from this process are unintentional and may not be corrected upon review.  I spent 14 minutes examining this patient, reviewing medications, and using patient centered shared decision making involving her cardiac care.  Prior to her visit I spent greater than 20 minutes reviewing her past medical history,  medications, and prior cardiac tests.

## 2020-11-29 ENCOUNTER — Other Ambulatory Visit: Payer: Self-pay

## 2020-11-29 ENCOUNTER — Encounter: Payer: Self-pay | Admitting: General Practice

## 2020-11-29 ENCOUNTER — Ambulatory Visit: Payer: 59 | Admitting: General Practice

## 2020-11-29 VITALS — BP 112/80 | HR 101 | Ht 72.0 in | Wt 268.8 lb

## 2020-11-29 DIAGNOSIS — Z79899 Other long term (current) drug therapy: Secondary | ICD-10-CM

## 2020-11-29 DIAGNOSIS — I25118 Atherosclerotic heart disease of native coronary artery with other forms of angina pectoris: Secondary | ICD-10-CM | POA: Diagnosis not present

## 2020-11-29 DIAGNOSIS — E785 Hyperlipidemia, unspecified: Secondary | ICD-10-CM | POA: Diagnosis not present

## 2020-11-29 DIAGNOSIS — I255 Ischemic cardiomyopathy: Secondary | ICD-10-CM

## 2020-11-29 MED ORDER — CARVEDILOL 6.25 MG PO TABS: 6.2500 mg | ORAL_TABLET | Freq: Two times a day (BID) | ORAL | 3 refills | Status: DC

## 2020-11-29 NOTE — Patient Instructions (Signed)
Medication Instructions:  STOP METOPROLOL  START CARVIDELOL 6.25 TWICE DAILY  *If you need a refill on your cardiac medications before your next appointment, please call your pharmacy*  Lab Work:   Testing/Procedures:  BMET TODAY   NONE  Special Instructions  MAINTAIN DIET AND EXERCISE   Follow-Up: Your next appointment:  2-4 week(s) In Person with Coletta Memos, FNP   At [facility], you and your health needs are our priority.  As part of our continuing mission to provide you with exceptional heart care, we have created designated Provider Care Teams.  These Care Teams include your primary Cardiologist (physician) and Advanced Practice Providers (APPs -  Physician Assistants and Nurse Practitioners) who all work together to provide you with the care you need, when you need it.

## 2020-11-30 LAB — BASIC METABOLIC PANEL
BUN/Creatinine Ratio: 15 (ref 9–20)
BUN: 21 mg/dL (ref 6–24)
CO2: 24 mmol/L (ref 20–29)
Calcium: 9.8 mg/dL (ref 8.7–10.2)
Chloride: 105 mmol/L (ref 96–106)
Creatinine, Ser: 1.37 mg/dL — ABNORMAL HIGH (ref 0.76–1.27)
Glucose: 85 mg/dL (ref 70–99)
Potassium: 4.7 mmol/L (ref 3.5–5.2)
Sodium: 143 mmol/L (ref 134–144)
eGFR: 63 mL/min/{1.73_m2} (ref >59)

## 2020-12-09 LAB — FUNGUS CULTURE W SMEAR
CULTURE:: NO GROWTH
MICRO NUMBER:: 12475600
SMEAR:: NONE SEEN
SPECIMEN QUALITY:: ADEQUATE

## 2020-12-09 LAB — WOUND CULTURE
MICRO NUMBER:: 12475599
SPECIMEN QUALITY:: ADEQUATE

## 2020-12-12 ENCOUNTER — Encounter: Payer: Self-pay | Admitting: *Deleted

## 2020-12-25 NOTE — Progress Notes (Signed)
Cardiology Clinic Note   Patient Name: Roberto Morrison Date of Encounter: 12/26/2020  Primary Care Provider:  Patient, No Pcp Per (Inactive) Primary Cardiologist:  Quay Burow, MD  Patient Profile    Roberto Morrison 50 year old male presents to the clinic today for review of his echocardiogram and systolic CHF.  Past Medical History    History reviewed. No pertinent past medical history. Past Surgical History:  Procedure Laterality Date   CORONARY ARTERY BYPASS GRAFT N/A 07/26/2020   Procedure: CORONARY ARTERY BYPASS GRAFTING (CABG) X THREE, USING LEFT INTERNAL MAMMARY ARTERY< RIGHT INTERNAL MAMMARY ARTERY AND LEFT RADIAL ARTERY CONDUITS.;  Surgeon: Wonda Olds, MD;  Location: [facility];  Service: Open Heart Surgery;  Laterality: N/A;   INTERCOSTAL NERVE BLOCK  07/26/2020   Procedure: INTERCOSTAL NERVE BLOCK;  Surgeon: Wonda Olds, MD;  Location: [facility];  Service: Open Heart Surgery;;   LEFT HEART CATH AND CORONARY ANGIOGRAPHY N/A 07/22/2020   Procedure: LEFT HEART CATH AND CORONARY ANGIOGRAPHY;  Surgeon: Lorretta Harp, MD;  Location: [facility];  Service: Cardiovascular;  Laterality: N/A;   RADIAL ARTERY HARVEST Left 07/26/2020   Procedure: RADIAL ARTERY HARVEST;  Surgeon: Wonda Olds, MD;  Location: [facility];  Service: Open Heart Surgery;  Laterality: Left;   TEE WITHOUT CARDIOVERSION N/A 07/26/2020   Procedure: TRANSESOPHAGEAL ECHOCARDIOGRAM (TEE);  Surgeon: Wonda Olds, MD;  Location: [facility];  Service: Open Heart Surgery;  Laterality: N/A;    Allergies  Allergies  Allergen Reactions   Prednisone Other (See Comments)    "Hiccups"     History of Present Illness    Roberto Morrison has a PMH of coronary artery disease status post CABG 6/22, ischemic cardiomyopathy, and hyperlipidemia.   He presented to the emergency department 6/22 with chest discomfort that occurred with increased physical activity.  He underwent cardiac catheterization which showed  severe coronary artery disease in his LAD and circumflex vessels.  He was also noted to have borderline disease in his RCA.  His echocardiogram showed an LVEF of 30-35%.  He underwent CABG x3 on 07/26/2020 LIMA-LAD, RIMA-PDA, left radial-obtuse marginal of circumflex.  He was also prescribed oral nitrates to optimize his arterial graft patency.  He progressed well postoperatively and his postoperative course was unremarkable.   He was seen by Laurann Montana, PA-C on 08/15/2020.  During that time he denied exertional chest discomfort.  He did note some incisional discomfort with laying on that side.  His pain resolved with laying on his back.  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  He reported that he had returned to some of that his gym activities.  He denied dyspnea with increased exertion and reported that he was maintaining sternal precautions.   He presented to the clinic 10/31/2020 for follow-up evaluation and to review his echocardiogram.  He stated he felt well.  He did have some increased stress related to a relationship/girlfriend.  He reported he felt he was recovering better with less stress.  He continued to be very physically active walking 30 minutes to 60 minutes 3 to 5 days/week he also continues to lift/resistance training 3 to 5 days/week.  He reported compliance with his medications.  We reviewed his current medication list.  He denies side effects.  He was tolerating his Entresto well.  I  ordered a BMP.  We discussed the progression/titration of the medication and reviewed his recent echocardiogram.  I planned follow-up in 4 weeks.  I  gave him the sleep hygiene information and information for PCP as well.  He presented to the clinic 11/29/2020 for follow-up evaluation stated he felt well.  He continued to be very physically active at the gym.  His blood pressure today is 112/80 and his pulses 101.  I  stopped his metoprolol and  started him on carvedilol 6.25 mg twice daily.  We continued his other medications, ordered a BMP, and maintained his current diet and have him follow-up in 2 to 4 weeks.  His BMP from 11/29/2020 showed stable electrolytes and a creatinine that had increased slightly to 1.37.  He presents the clinic today for follow-up evaluation states he feels well.  He continues to be very physically active at the gym and is almost back to his previous lifting weights.  His pulse rate is much better today 83 bpm.  We reviewed his previous metabolic panel.  I feel that he was dehydrated during his previous visit contributed to his elevated heart rate creatinine level.  I will repeat his BMP today and if it is stable plan to increase his Entresto.  If we are unable to [employer] we will plan for echocardiogram in 1 month.  We will continue his current diet.   Today he denies chest pain, shortness of breath, lower extremity edema, fatigue, palpitations, melena, hematuria, hemoptysis, diaphoresis, weakness, presyncope, syncope, orthopnea, and PND.  Home Medications    Prior to Admission medications   Medication Sig Start Date End Date Taking? Authorizing Provider  aspirin EC 81 MG EC tablet Take 1 tablet (81 mg total) by mouth daily. Swallow whole. 07/31/20   Antony Odea, PA-C  atorvastatin (LIPITOR) 80 MG tablet Take 1 tablet (80 mg total) by mouth daily. 08/15/20   Loel Dubonnet, NP  clopidogrel (PLAVIX) 75 MG tablet Take 1 tablet (75 mg total) by mouth daily. 08/15/20   Loel Dubonnet, NP  empagliflozin (JARDIANCE) 10 MG TABS tablet Take 1 tablet (10 mg total) by mouth daily before breakfast. 08/15/20   Loel Dubonnet, NP  metoprolol succinate (TOPROL XL) 50 MG 24 hr tablet Take 1 tablet (50 mg total) by mouth daily. Take with or immediately following a meal. 08/15/20 08/15/21  Loel Dubonnet, NP  sacubitril-valsartan (ENTRESTO) 24-26 MG Take 1 tablet by mouth 2 (two) times daily. 10/19/20    Loel Dubonnet, NP  sulfamethoxazole-trimethoprim (BACTRIM DS) 800-160 MG tablet Take 1 tablet by mouth 2 (two) times daily. 11/14/20   [name], PA-C    Family History    History reviewed. No pertinent family history. has no family status information on file.   Social History    Social History   Socioeconomic History   Marital status: Single    Spouse name: Not on file   Number of children: Not on file   Years of education: Not on file   Highest education level: Not on file  Occupational History   Not on file  Tobacco Use   Smoking status: Never   Smokeless tobacco: Never  Substance and Sexual Activity   Alcohol use: Not Currently   Drug use: Never   Sexual activity: Not on file  Other Topics Concern   Not on file  Social History Narrative   Not on file   Social Determinants of Health   Financial Resource Strain: Not on file  Food Insecurity: Not on file  Transportation Needs: Not on file  Physical Activity: Not on file  Stress: Not on file  Social Connections: Not on file  Intimate Partner Violence: Not on file     Review of Systems    General:  No chills, fever, night sweats or weight changes.  Cardiovascular:  No chest pain, dyspnea on exertion, edema, orthopnea, palpitations, paroxysmal nocturnal dyspnea. Dermatological: No rash, lesions/masses Respiratory: No cough, dyspnea Urologic: No hematuria, dysuria Abdominal:   No nausea, vomiting, diarrhea, bright red blood per rectum, melena, or hematemesis Neurologic:  No visual changes, wkns, changes in mental status. All other systems reviewed and are otherwise negative except as noted above.  Physical Exam    VS:  BP 122/64   Pulse 83   Ht 6' (1.829 m)   Wt 274 lb (124.3 kg)   BMI 37.16 kg/m  , BMI Body mass index is 37.16 kg/m. GEN: Well nourished, well developed, in no acute distress. HEENT: normal. Neck: Supple, no JVD, carotid bruits, or masses. Cardiac: RRR, no murmurs, rubs, or  gallops. No clubbing, cyanosis, edema.  Radials/DP/PT 2+ and equal bilaterally.  Respiratory:  Respirations regular and unlabored, clear to auscultation bilaterally. GI: Soft, nontender, nondistended, BS + x 4. MS: no deformity or atrophy. Skin: warm and dry, no rash. Neuro:  Strength and sensation are intact. Psych: Normal affect.  Accessory Clinical Findings    Recent Labs: 07/27/2020: Magnesium 2.2 09/12/2020: ALT 35; Hemoglobin 16.5; Platelets 216 11/29/2020: BUN 21; Creatinine, Ser 1.37; Potassium 4.7; Sodium 143   Recent Lipid Panel    Component Value Date/Time   CHOL 113 09/12/2020 0816   TRIG 99 09/12/2020 0816   HDL 33 (L) 09/12/2020 0816   CHOLHDL 3.4 09/12/2020 0816   CHOLHDL 5.3 07/25/2020 0928   VLDL 14 07/25/2020 0928   LDLCALC 61 09/12/2020 0816    ECG personally reviewed by me today-none today.  Echocardiogram 07/22/2020 IMPRESSIONS     1. Left ventricular ejection fraction, by estimation, is 30 to 35%. The  left ventricle has moderately decreased function. The left ventricle  demonstrates regional wall motion abnormalities (see scoring  diagram/findings for description). Left ventricular   diastolic parameters are consistent with Grade I diastolic dysfunction  (impaired relaxation). There is severe hypokinesis of the left  ventricular, mid-apical anteroseptal wall.   2. Right ventricular systolic function is normal. The right ventricular  size is normal.   3. The mitral valve is normal in structure. Trivial mitral valve  regurgitation. No evidence of mitral stenosis.   4. The aortic valve is normal in structure. Aortic valve regurgitation is  not visualized. No aortic stenosis is present.   Cardiac catheterization 07/22/20   IMPRESSION: Mr. Saric was admitted with a non-STEMI.  He has three-vessel disease.  He has a long segmental mid LAD lesion and a moderately long mid AV groove circumflex lesion lesion with what appears to be a moderate though not  significant distal RCA lesion.  His filling pressures were normal.  His EF looked preserved.  I believe he would be best served with coronary artery bypass grafting with bilateral IMA's.  Surgical consult has been called.  The sheath was removed and a TR band was placed on the right wrist to achieve patent hemostasis.  The patient left the lab in stable condition.  Plan will be to restart IV heparin 4 hours after sheath removal.   Quay Burow. MD, [facility] 07/22/2020 4:11 PM   Diagnostic Dominance: Right       Echocardiogram 10/18/2020   IMPRESSIONS  1. Left ventricular ejection fraction, by estimation, is 40 to 45%. The  left ventricle has mildly decreased function. The left ventricle  demonstrates regional wall motion abnormalities (suggestive of LAD  disease). There is severe asymmetric left  ventricular hypertrophy. Left ventricular diastolic parameters are  consistent with Grade I diastolic dysfunction (impaired relaxation). No  systolic anterior motion of the mitral valve.   2. Right ventricular systolic function is mildly reduced. The right  ventricular size is normal. Mildly increased right ventricular wall  thickness. Tricuspid regurgitation signal is inadequate for assessing PA  pressure.   3. The mitral valve is normal in structure. No evidence of mitral valve  regurgitation.   4. The aortic valve is tricuspid. Aortic valve regurgitation is mild. No  aortic stenosis is present.   5. The inferior vena cava is normal in size with greater than 50%  respiratory variability, suggesting right atrial pressure of 3 mmHg.   Comparison(s): A prior study was performed on 07/22/20. LVEF has improved  with normalization of LV size.   Assessment & Plan   1.   HFrEF/ischemic cardiomyopathy-remains physically active.  Denies increased dyspnea with exertion.  Prior to CABG EF 30-35%, intraoperative LVEF 40-45%.  Echocardiogram 10/18/2020 showed EF 40-45% with G1 DD.  Blood pressure  did not previously allow ACE ARB Arni. Continue Jardiance, carvedilol Continue ontinue Entresto 24-26-plan to [employer] if BMP/creatinine are stable. Repeat BMP Repeat echocardiogram in 1 month after medication is optimized.   Coronary artery disease-no chest pain today.  Status post CABG x3 07/26/2020. Continue aspirin, atorvastatin, clopidogrel,  Continue carvedilol Heart healthy low-sodium diet-salty 6 given Increase physical activity as tolerated   Hyperlipidemia-09/12/2020: Cholesterol, Total 113; HDL 33; LDL Chol Calc (NIH) 61; Triglycerides 99 Continue atorvastatin, aspirin Heart healthy low-sodium high-fiber diet Continue physical activity as tolerated   Disposition: Follow-up with Dr. Gwenlyn Found or APP in one month of after the Echo.  Jossie Ng. Lafern Brinkley NP-C    12/26/2020, 1:59 PM [place] [address] Office [phone] Fax [phone]  Notice: This dictation was prepared with Dragon dictation along with smaller phrase technology. Any transcriptional errors that result from this process are unintentional and may not be corrected upon review.  I spent 14 minutes examining this patient, reviewing medications, and using patient centered shared decision making involving her cardiac care.  Prior to her visit I spent greater than 20 minutes reviewing her past medical history,  medications, and prior cardiac tests.

## 2020-12-26 ENCOUNTER — Other Ambulatory Visit: Payer: Self-pay

## 2020-12-26 ENCOUNTER — Encounter: Payer: Self-pay | Admitting: General Practice

## 2020-12-26 ENCOUNTER — Ambulatory Visit: Payer: 59 | Admitting: General Practice

## 2020-12-26 VITALS — BP 122/64 | HR 83 | Ht 72.0 in | Wt 274.0 lb

## 2020-12-26 DIAGNOSIS — E785 Hyperlipidemia, unspecified: Secondary | ICD-10-CM | POA: Diagnosis not present

## 2020-12-26 DIAGNOSIS — Z79899 Other long term (current) drug therapy: Secondary | ICD-10-CM

## 2020-12-26 DIAGNOSIS — I25118 Atherosclerotic heart disease of native coronary artery with other forms of angina pectoris: Secondary | ICD-10-CM

## 2020-12-26 DIAGNOSIS — I255 Ischemic cardiomyopathy: Secondary | ICD-10-CM | POA: Diagnosis not present

## 2020-12-26 DIAGNOSIS — Z951 Presence of aortocoronary bypass graft: Secondary | ICD-10-CM

## 2020-12-26 NOTE — Patient Instructions (Signed)
Medication Instructions:  Your physician recommends that you continue on your current medications as directed. Please refer to the Current Medication list given to you today.  *If you need a refill on your cardiac medications before your next appointment, please call your pharmacy*   Lab Work: Your physician recommends that you have labs drawn today: BMET  If you have labs (blood work) drawn today and your tests are completely normal, you will receive your results only by: [place] (if you have MyChart) OR A paper copy in the mail If you have any lab test that is abnormal or we need to change your treatment, we will call you to review the results.   Follow-Up: At [facility], you and your health needs are our priority.  As part of our continuing mission to provide you with exceptional heart care, we have created designated Provider Care Teams.  These Care Teams include your primary Cardiologist (physician) and Advanced Practice Providers (APPs -  Physician Assistants and Nurse Practitioners) who all work together to provide you with the care you need, when you need it.  We recommend signing up for the patient portal called "MyChart".  Sign up information is provided on this After Visit Summary.  MyChart is used to connect with patients for Virtual Visits (Telemedicine).  Patients are able to view lab/test results, encounter notes, upcoming appointments, etc.  Non-urgent messages can be sent to your provider as well.   To learn more about what you can do with MyChart, go to [email or web address].    Your next appointment:   1 month(s)  The format for your next appointment:   In Person  Provider:   Coletta Memos, FNP

## 2020-12-27 LAB — BASIC METABOLIC PANEL
BUN/Creatinine Ratio: 17 (ref 9–20)
BUN: 25 mg/dL — ABNORMAL HIGH (ref 6–24)
CO2: 22 mmol/L (ref 20–29)
Calcium: 9.1 mg/dL (ref 8.7–10.2)
Chloride: 104 mmol/L (ref 96–106)
Creatinine, Ser: 1.46 mg/dL — ABNORMAL HIGH (ref 0.76–1.27)
Glucose: 91 mg/dL (ref 70–99)
Potassium: 4.2 mmol/L (ref 3.5–5.2)
Sodium: 144 mmol/L (ref 134–144)
eGFR: 59 mL/min/{1.73_m2} — ABNORMAL LOW (ref >59)

## 2021-01-02 ENCOUNTER — Other Ambulatory Visit: Payer: Self-pay

## 2021-01-02 DIAGNOSIS — R7989 Other specified abnormal findings of blood chemistry: Secondary | ICD-10-CM

## 2021-01-02 MED ORDER — LOSARTAN POTASSIUM 25 MG PO TABS: 25.0000 mg | ORAL_TABLET | Freq: Every day | ORAL | 3 refills | Status: DC

## 2021-01-16 ENCOUNTER — Other Ambulatory Visit: Payer: Self-pay | Admitting: Physician Assistant

## 2021-01-16 DIAGNOSIS — I255 Ischemic cardiomyopathy: Secondary | ICD-10-CM

## 2021-01-18 LAB — BASIC METABOLIC PANEL
BUN/Creatinine Ratio: 13 (ref 9–20)
BUN: 15 mg/dL (ref 6–24)
CO2: 21 mmol/L (ref 20–29)
Calcium: 9 mg/dL (ref 8.7–10.2)
Chloride: 102 mmol/L (ref 96–106)
Creatinine, Ser: 1.18 mg/dL (ref 0.76–1.27)
Glucose: 111 mg/dL — ABNORMAL HIGH (ref 70–99)
Potassium: 4.4 mmol/L (ref 3.5–5.2)
Sodium: 139 mmol/L (ref 134–144)
eGFR: 76 mL/min/{1.73_m2} (ref >59)

## 2021-01-18 NOTE — Progress Notes (Signed)
Cardiology Clinic Note   Patient Name: Roberto Morrison Date of Encounter: 01/20/2021  Primary Care Provider:  Patient, No Pcp Per (Inactive) Primary Cardiologist:  Quay Burow, MD  Patient Profile    Roberto Morrison 50 year old male presents to the clinic today for review of his systolic CHF and renal function.  Past Medical History    No past medical history on file. Past Surgical History:  Procedure Laterality Date   CORONARY ARTERY BYPASS GRAFT N/A 07/26/2020   Procedure: CORONARY ARTERY BYPASS GRAFTING (CABG) X THREE, USING LEFT INTERNAL MAMMARY ARTERY< RIGHT INTERNAL MAMMARY ARTERY AND LEFT RADIAL ARTERY CONDUITS.;  Surgeon: Wonda Olds, MD;  Location: [facility];  Service: Open Heart Surgery;  Laterality: N/A;   INTERCOSTAL NERVE BLOCK  07/26/2020   Procedure: INTERCOSTAL NERVE BLOCK;  Surgeon: Wonda Olds, MD;  Location: [facility];  Service: Open Heart Surgery;;   LEFT HEART CATH AND CORONARY ANGIOGRAPHY N/A 07/22/2020   Procedure: LEFT HEART CATH AND CORONARY ANGIOGRAPHY;  Surgeon: Lorretta Harp, MD;  Location: [facility];  Service: Cardiovascular;  Laterality: N/A;   RADIAL ARTERY HARVEST Left 07/26/2020   Procedure: RADIAL ARTERY HARVEST;  Surgeon: Wonda Olds, MD;  Location: [facility];  Service: Open Heart Surgery;  Laterality: Left;   TEE WITHOUT CARDIOVERSION N/A 07/26/2020   Procedure: TRANSESOPHAGEAL ECHOCARDIOGRAM (TEE);  Surgeon: Wonda Olds, MD;  Location: [facility];  Service: Open Heart Surgery;  Laterality: N/A;    Allergies  Allergies  Allergen Reactions   Prednisone Other (See Comments)    "Hiccups"     History of Present Illness    Roberto Morrison has a PMH of coronary artery disease status post CABG 6/22, ischemic cardiomyopathy, and hyperlipidemia.   He presented to the emergency department 6/22 with chest discomfort that occurred with increased physical activity.  He underwent cardiac catheterization which showed severe coronary  artery disease in his LAD and circumflex vessels.  He was also noted to have borderline disease in his RCA.  His echocardiogram showed an LVEF of 30-35%.  He underwent CABG x3 on 07/26/2020 LIMA-LAD, RIMA-PDA, left radial-obtuse marginal of circumflex.  He was also prescribed oral nitrates to optimize his arterial graft patency.  He progressed well postoperatively and his postoperative course was unremarkable.   He was seen by Laurann Montana, PA-C on 08/15/2020.  During that time he denied exertional chest discomfort.  He did note some incisional discomfort with laying on that side.  His pain resolved with laying on his back.  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  He reported that he had returned to some of that his gym activities.  He denied dyspnea with increased exertion and reported that he was maintaining sternal precautions.   He presented to the clinic 10/31/2020 for follow-up evaluation and to review his echocardiogram.  He stated he felt well.  He did have some increased stress related to a relationship/girlfriend.  He reported he felt he was recovering better with less stress.  He continued to be very physically active walking 30 minutes to 60 minutes 3 to 5 days/week he also continues to lift/resistance training 3 to 5 days/week.  He reported compliance with his medications.  We reviewed his current medication list.  He denies side effects.  He was tolerating his Entresto well.  I  ordered a BMP.  We discussed the progression/titration of the medication and reviewed his recent echocardiogram.  I planned follow-up in 4 weeks.  I  gave him the sleep hygiene information and information for PCP as well.  He presented to the clinic 11/29/2020 for follow-up evaluation stated he felt well.  He continued to be very physically active at the gym.  His blood pressure today is 112/80 and his pulses 101.  I  stopped his metoprolol and started him on  carvedilol 6.25 mg twice daily.  We continued his other medications, ordered a BMP, and maintained his current diet and have him follow-up in 2 to 4 weeks.  His BMP from 11/29/2020 showed stable electrolytes and a creatinine that had increased slightly to 1.37.  He presented to the clinic 12/26/2020 for follow-up evaluation stated he continued to feel well.  He continued to be very physically active at the gym and was almost back to his previous lifting weights.  His pulse rate was much better at 83 bpm.  We reviewed his previous metabolic panel.  I felt that he was dehydrated during his previous visit contributing to his elevated heart rate and creatinine level.  I repeated his BMP  and planned to increase his Entresto.  If we are unable to [employer] we will plan for echocardiogram in 1 month.  I asked him to continue his heart healthy diet.  His BMP 12/26/2020 showed elevated creatinine at 1.46.  His Entresto was discontinued and he was started on losartan 25 mg daily.  His BMP on 01/17/2021 had improved to 1.18.  He presents the clinic today for follow-up evaluation states he feels well.  He continues to be very physically active doing resistance training.  He reports that he has been tolerating losartan well and has not noticed any side effects.  We reviewed his most recent lab work and he expressed understanding.  I will increase his losartan to 50 mg daily, repeat a BMP in 1 week, and have him follow-up in 1 month.  We will continue his current physical activity and diet.   Today he denies chest pain, shortness of breath, lower extremity edema, fatigue, palpitations, melena, hematuria, hemoptysis, diaphoresis, weakness, presyncope, syncope, orthopnea, and PND.  Home Medications    Prior to Admission medications   Medication Sig Start Date End Date Taking? Authorizing Provider  aspirin EC 81 MG EC tablet Take 1 tablet (81 mg total) by mouth daily. Swallow whole. 07/31/20   Antony Odea, PA-C  atorvastatin (LIPITOR) 80 MG tablet Take 1 tablet (80 mg total) by mouth daily. 08/15/20   Loel Dubonnet, NP  clopidogrel (PLAVIX) 75 MG tablet Take 1 tablet (75 mg total) by mouth daily. 08/15/20   Loel Dubonnet, NP  empagliflozin (JARDIANCE) 10 MG TABS tablet Take 1 tablet (10 mg total) by mouth daily before breakfast. 08/15/20   Loel Dubonnet, NP  metoprolol succinate (TOPROL XL) 50 MG 24 hr tablet Take 1 tablet (50 mg total) by mouth daily. Take with or immediately following a meal. 08/15/20 08/15/21  Loel Dubonnet, NP  sacubitril-valsartan (ENTRESTO) 24-26 MG Take 1 tablet by mouth 2 (two) times daily. 10/19/20   Loel Dubonnet, NP  sulfamethoxazole-trimethoprim (BACTRIM DS) 800-160 MG tablet Take 1 tablet by mouth 2 (two) times daily. 11/14/20   [name], PA-C    Family History    No family history on file. has no family status information on file.    Social History    Social History   Socioeconomic History   Marital status: Single  Spouse name: Not on file   Number of children: Not on file   Years of education: Not on file   Highest education level: Not on file  Occupational History   Not on file  Tobacco Use   Smoking status: Never   Smokeless tobacco: Never  Substance and Sexual Activity   Alcohol use: Not Currently   Drug use: Never   Sexual activity: Not on file  Other Topics Concern   Not on file  Social History Narrative   Not on file   Social Determinants of Health   Financial Resource Strain: Not on file  Food Insecurity: Not on file  Transportation Needs: Not on file  Physical Activity: Not on file  Stress: Not on file  Social Connections: Not on file  Intimate Partner Violence: Not on file     Review of Systems    General:  No chills, fever, night sweats or weight changes.  Cardiovascular:  No chest pain, dyspnea on exertion, edema, orthopnea, palpitations, paroxysmal nocturnal dyspnea. Dermatological: No rash,  lesions/masses Respiratory: No cough, dyspnea Urologic: No hematuria, dysuria Abdominal:   No nausea, vomiting, diarrhea, bright red blood per rectum, melena, or hematemesis Neurologic:  No visual changes, wkns, changes in mental status. All other systems reviewed and are otherwise negative except as noted above.  Physical Exam    VS:  BP 132/88    Pulse 83    Ht 6' (1.829 m)    Wt 276 lb 3.2 oz (125.3 kg)    SpO2 99%    BMI 37.46 kg/m  , BMI Body mass index is 37.46 kg/m. GEN: Well nourished, well developed, in no acute distress. HEENT: normal. Neck: Supple, no JVD, carotid bruits, or masses. Cardiac: RRR, no murmurs, rubs, or gallops. No clubbing, cyanosis, edema.  Radials/DP/PT 2+ and equal bilaterally.  Respiratory:  Respirations regular and unlabored, clear to auscultation bilaterally. GI: Soft, nontender, nondistended, BS + x 4. MS: no deformity or atrophy. Skin: warm and dry, no rash. Neuro:  Strength and sensation are intact. Psych: Normal affect.  Accessory Clinical Findings    Recent Labs: 07/27/2020: Magnesium 2.2 09/12/2020: ALT 35; Hemoglobin 16.5; Platelets 216 01/17/2021: BUN 15; Creatinine, Ser 1.18; Potassium 4.4; Sodium 139   Recent Lipid Panel    Component Value Date/Time   CHOL 113 09/12/2020 0816   TRIG 99 09/12/2020 0816   HDL 33 (L) 09/12/2020 0816   CHOLHDL 3.4 09/12/2020 0816   CHOLHDL 5.3 07/25/2020 0928   VLDL 14 07/25/2020 0928   LDLCALC 61 09/12/2020 0816    ECG personally reviewed by me today-none today.  Echocardiogram 07/22/2020 IMPRESSIONS     1. Left ventricular ejection fraction, by estimation, is 30 to 35%. The  left ventricle has moderately decreased function. The left ventricle  demonstrates regional wall motion abnormalities (see scoring  diagram/findings for description). Left ventricular   diastolic parameters are consistent with Grade I diastolic dysfunction  (impaired relaxation). There is severe hypokinesis of the left   ventricular, mid-apical anteroseptal wall.   2. Right ventricular systolic function is normal. The right ventricular  size is normal.   3. The mitral valve is normal in structure. Trivial mitral valve  regurgitation. No evidence of mitral stenosis.   4. The aortic valve is normal in structure. Aortic valve regurgitation is  not visualized. No aortic stenosis is present.   Cardiac catheterization 07/22/20   IMPRESSION: Mr. Merrick was admitted with a non-STEMI.  He has three-vessel disease.  He has a  long segmental mid LAD lesion and a moderately long mid AV groove circumflex lesion lesion with what appears to be a moderate though not significant distal RCA lesion.  His filling pressures were normal.  His EF looked preserved.  I believe he would be best served with coronary artery bypass grafting with bilateral IMA's.  Surgical consult has been called.  The sheath was removed and a TR band was placed on the right wrist to achieve patent hemostasis.  The patient left the lab in stable condition.  Plan will be to restart IV heparin 4 hours after sheath removal.   Quay Burow. MD, [facility] 07/22/2020 4:11 PM   Diagnostic Dominance: Right       Echocardiogram 10/18/2020   IMPRESSIONS     1. Left ventricular ejection fraction, by estimation, is 40 to 45%. The  left ventricle has mildly decreased function. The left ventricle  demonstrates regional wall motion abnormalities (suggestive of LAD  disease). There is severe asymmetric left  ventricular hypertrophy. Left ventricular diastolic parameters are  consistent with Grade I diastolic dysfunction (impaired relaxation). No  systolic anterior motion of the mitral valve.   2. Right ventricular systolic function is mildly reduced. The right  ventricular size is normal. Mildly increased right ventricular wall  thickness. Tricuspid regurgitation signal is inadequate for assessing PA  pressure.   3. The mitral valve is normal in structure. No  evidence of mitral valve  regurgitation.   4. The aortic valve is tricuspid. Aortic valve regurgitation is mild. No  aortic stenosis is present.   5. The inferior vena cava is normal in size with greater than 50%  respiratory variability, suggesting right atrial pressure of 3 mmHg.   Comparison(s): A prior study was performed on 07/22/20. LVEF has improved  with normalization of LV size.   Assessment & Plan   1.   HFrEF/ischemic cardiomyopathy-stable.  NYHA class I.  Denies  dyspnea with exertion.  Prior to CABG EF 30-35%, intraoperative LVEF 40-45%.  Echocardiogram 10/18/2020 showed EF 40-45% with G1 DD.  Blood pressure did not previously allow ACE ARB Arni.  Entresto discontinued due to elevated renal function. Continue Jardiance, carvedilol Increase losartan to 50 mg daily Repeat BMP in 1 week Repeat echocardiogram in 1 month after medication is optimized.   Coronary artery disease-continues to deny chest discomfort.  Status post CABG x3 07/26/2020. Continue aspirin, atorvastatin, clopidogrel,  Continue carvedilol Heart healthy low-sodium diet-salty 6 given Increase physical activity as tolerated   Hyperlipidemia-09/12/2020: Cholesterol, Total 113; HDL 33; LDL Chol Calc (NIH) 61; Triglycerides 99 Continue atorvastatin, aspirin Heart healthy low-sodium high-fiber diet Continue physical activity as tolerated   Disposition: Follow-up with Dr. Gwenlyn Found or me in 1 month.  Jossie Ng. Lauris Serviss NP-C    01/20/2021, 2:57 PM [facility] [place] [address] Office [phone] Fax [phone]  Notice: This dictation was prepared with Dragon dictation along with smaller phrase technology. Any transcriptional errors that result from this process are unintentional and may not be corrected upon review.  I spent 14 minutes examining this patient, reviewing medications, and using patient centered shared decision making involving her cardiac care.  Prior to her visit  I spent greater than 20 minutes reviewing her past medical history,  medications, and prior cardiac tests.

## 2021-01-20 ENCOUNTER — Other Ambulatory Visit: Payer: Self-pay

## 2021-01-20 ENCOUNTER — Encounter: Payer: Self-pay | Admitting: General Practice

## 2021-01-20 ENCOUNTER — Ambulatory Visit: Payer: 59 | Admitting: General Practice

## 2021-01-20 VITALS — BP 132/88 | HR 83 | Ht 72.0 in | Wt 276.2 lb

## 2021-01-20 DIAGNOSIS — E785 Hyperlipidemia, unspecified: Secondary | ICD-10-CM

## 2021-01-20 DIAGNOSIS — Z951 Presence of aortocoronary bypass graft: Secondary | ICD-10-CM

## 2021-01-20 DIAGNOSIS — I255 Ischemic cardiomyopathy: Secondary | ICD-10-CM

## 2021-01-20 DIAGNOSIS — I25118 Atherosclerotic heart disease of native coronary artery with other forms of angina pectoris: Secondary | ICD-10-CM

## 2021-01-20 DIAGNOSIS — I5020 Unspecified systolic (congestive) heart failure: Secondary | ICD-10-CM | POA: Diagnosis not present

## 2021-01-20 DIAGNOSIS — Z79899 Other long term (current) drug therapy: Secondary | ICD-10-CM

## 2021-01-20 MED ORDER — LOSARTAN POTASSIUM 50 MG PO TABS: 50.0000 mg | ORAL_TABLET | Freq: Every day | ORAL | 3 refills | Status: DC

## 2021-01-20 NOTE — Patient Instructions (Addendum)
Medication Instructions:  INCREASE LOSARTAN 50MG  DAILY; YOU MAY TAKE 2 OF WHAT YOU HAVE  *If you need a refill on your cardiac medications before your next appointment, please call your pharmacy*  Lab Work:   Testing/Procedures:  BMET IN 1 WEEK  NONE  Special Instructions PLEASE [place]  PLEASE MAINTAIN PHYSICAL ACTIVITY AS TOLERATED   Follow-Up: Your next appointment:  1 month(s) In Person with DR Gwenlyn Found 02-28-21 at 2:15PM  At [facility], you and your health needs are our priority.  As part of our continuing mission to provide you with exceptional heart care, we have created designated Provider Care Teams.  These Care Teams include your primary Cardiologist (physician) and Advanced Practice Providers (APPs -  Physician Assistants and Nurse Practitioners) who all work together to provide you with the care you need, when you need it.          Heart-Healthy Eating Plan Heart-healthy meal planning includes: Eating less unhealthy fats. Eating more healthy fats. Making other changes in your diet. Talk with your doctor or a diet specialist (dietitian) to create an eating plan that is right for you. What is my plan? Your doctor may recommend an eating plan that includes: Total fat: ______% or less of total calories a day. Saturated fat: ______% or less of total calories a day. Cholesterol: less than _________mg a day. What are tips for following this plan? Cooking Avoid frying your food. Try to bake, boil, grill, or broil it instead. You can also reduce fat by: Removing the skin from poultry. Removing all visible fats from meats. Steaming vegetables in water or broth. Meal planning  At meals, divide your plate into four equal parts: Fill one-half of your plate with vegetables and green salads. Fill one-fourth of your plate with whole grains. Fill one-fourth of your plate with lean protein foods. Eat 4-5 servings of vegetables per day. A  serving of vegetables is: 1 cup of raw or cooked vegetables. 2 cups of raw leafy greens. Eat 4-5 servings of fruit per day. A serving of fruit is: 1 medium whole fruit.  cup of dried fruit.  cup of fresh, frozen, or canned fruit.  cup of 100% fruit juice. Eat more foods that have soluble fiber. These are apples, broccoli, carrots, beans, peas, and barley. Try to get 20-30 g of fiber per day. Eat 4-5 servings of nuts, legumes, and seeds per week: 1 serving of dried beans or legumes equals  cup after being cooked. 1 serving of nuts is  cup. 1 serving of seeds equals 1 tablespoon. General information Eat more home-cooked food. Eat less restaurant, buffet, and fast food. Limit or avoid alcohol. Limit foods that are high in starch and sugar. Avoid fried foods. Lose weight if you are overweight. Keep track of how much salt (sodium) you eat. This is important if you have high blood pressure. Ask your doctor to tell you more about this. Try to add vegetarian meals each week. Fats Choose healthy fats. These include olive oil and canola oil, flaxseeds, walnuts, almonds, and seeds. Eat more omega-3 fats. These include salmon, mackerel, sardines, tuna, flaxseed oil, and ground flaxseeds. Try to eat fish at least 2 times each week. Check food labels. Avoid foods with trans fats or high amounts of saturated fat. Limit saturated fats. These are often found in animal products, such as meats, butter, and cream. These are also found in plant foods, such as palm oil, palm kernel oil, and coconut  oil. Avoid foods with partially hydrogenated oils in them. These have trans fats. Examples are stick margarine, some tub margarines, cookies, crackers, and other baked goods. What foods can I eat? Fruits All fresh, canned (in natural juice), or frozen fruits. Vegetables Fresh or frozen vegetables (raw, steamed, roasted, or grilled). Green salads. Grains Most grains. Choose whole wheat and whole grains  most of the time. Rice and pasta, including brown rice and pastas made with whole wheat. Meats and other proteins Lean, well-trimmed beef, veal, pork, and lamb. Chicken and [place] without skin. All fish and shellfish. Wild duck, rabbit, pheasant, and venison. Egg whites or low-cholesterol egg substitutes. Dried beans, peas, lentils, and tofu. Seeds and most nuts. Dairy Low-fat or nonfat cheeses, including ricotta and mozzarella. Skim or 1% milk that is liquid, powdered, or evaporated. Buttermilk that is made with low-fat milk. Nonfat or low-fat yogurt. Fats and oils Non-hydrogenated (trans-free) margarines. Vegetable oils, including soybean, sesame, sunflower, olive, peanut, safflower, corn, canola, and cottonseed. Salad dressings or mayonnaise made with a vegetable oil. Beverages Mineral water. Coffee and tea. Diet carbonated beverages. Sweets and desserts Sherbet, gelatin, and fruit ice. Small amounts of dark chocolate. Limit all sweets and desserts. Seasonings and condiments All seasonings and condiments. The items listed above may not be a complete list of foods and drinks you can eat. Contact a dietitian for more options. What foods should I avoid? Fruits Canned fruit in heavy syrup. Fruit in cream or butter sauce. Fried fruit. Limit coconut. Vegetables Vegetables cooked in cheese, cream, or butter sauce. Fried vegetables. Grains Breads that are made with saturated or trans fats, oils, or whole milk. Croissants. Sweet rolls. Donuts. High-fat crackers, such as cheese crackers. Meats and other proteins Fatty meats, such as hot dogs, ribs, sausage, bacon, rib-eye roast or steak. High-fat deli meats, such as salami and bologna. Caviar. Domestic duck and goose. Organ meats, such as liver. Dairy Cream, sour cream, cream cheese, and creamed cottage cheese. Whole-milk cheeses. Whole or 2% milk that is liquid, evaporated, or condensed. Whole buttermilk. Cream sauce or high-fat cheese sauce.  Yogurt that is made from whole milk. Fats and oils Meat fat, or shortening. Cocoa butter, hydrogenated oils, palm oil, coconut oil, palm kernel oil. Solid fats and shortenings, including bacon fat, salt pork, lard, and butter. Nondairy cream substitutes. Salad dressings with cheese or sour cream. Beverages Regular sodas and juice drinks with added sugar. Sweets and desserts Frosting. Pudding. Cookies. Cakes. Pies. Milk chocolate or white chocolate. Buttered syrups. Full-fat ice cream or ice cream drinks. The items listed above may not be a complete list of foods and drinks to avoid. Contact a dietitian for more information. Summary Heart-healthy meal planning includes eating less unhealthy fats, eating more healthy fats, and making other changes in your diet. Eat a balanced diet. This includes fruits and vegetables, low-fat or nonfat dairy, lean protein, nuts and legumes, whole grains, and heart-healthy oils and fats. This information is not intended to replace advice given to you by your health care provider. Make sure you discuss any questions you have with your health care provider. Document Revised: 06/02/2020 Document Reviewed: 06/02/2020 Elsevier Patient Education  2022 [employer].

## 2021-01-28 LAB — BASIC METABOLIC PANEL
BUN/Creatinine Ratio: 13 (ref 9–20)
BUN: 16 mg/dL (ref 6–24)
CO2: 26 mmol/L (ref 20–29)
Calcium: 9.6 mg/dL (ref 8.7–10.2)
Chloride: 102 mmol/L (ref 96–106)
Creatinine, Ser: 1.26 mg/dL (ref 0.76–1.27)
Glucose: 64 mg/dL — ABNORMAL LOW (ref 70–99)
Potassium: 4.2 mmol/L (ref 3.5–5.2)
Sodium: 143 mmol/L (ref 134–144)
eGFR: 70 mL/min/{1.73_m2} (ref >59)

## 2021-02-28 ENCOUNTER — Ambulatory Visit: Payer: 59 | Admitting: Cardiovascular Disease

## 2021-03-10 ENCOUNTER — Telehealth: Payer: Self-pay | Admitting: Cardiovascular Disease

## 2021-03-10 DIAGNOSIS — I255 Ischemic cardiomyopathy: Secondary | ICD-10-CM

## 2021-03-10 DIAGNOSIS — Z79899 Other long term (current) drug therapy: Secondary | ICD-10-CM

## 2021-03-10 DIAGNOSIS — I5020 Unspecified systolic (congestive) heart failure: Secondary | ICD-10-CM

## 2021-03-10 DIAGNOSIS — Z951 Presence of aortocoronary bypass graft: Secondary | ICD-10-CM

## 2021-03-10 DIAGNOSIS — I25118 Atherosclerotic heart disease of native coronary artery with other forms of angina pectoris: Secondary | ICD-10-CM

## 2021-03-10 NOTE — Telephone Encounter (Signed)
°  Per MyChart scheduling message:  I need to have an appointment set up to check my blood work based on the new dosage of medication that I have been put on to make sure theres not any negative response in the kidneys. I was getting blood work done every two weeks if they have not scheduled me for one now since they have adjusted my medication, so I need to get that done to make sure the kidney damage has been done.   I do not see any orders in the system at this time.

## 2021-03-10 NOTE — Telephone Encounter (Signed)
Pt informed of providers result & recommendations. Pt verbalized understanding. No further questions. Future lab order entered

## 2021-03-11 LAB — BASIC METABOLIC PANEL
BUN/Creatinine Ratio: 16 (ref 9–20)
BUN: 21 mg/dL (ref 6–24)
CO2: 20 mmol/L (ref 20–29)
Calcium: 9.2 mg/dL (ref 8.7–10.2)
Chloride: 100 mmol/L (ref 96–106)
Creatinine, Ser: 1.35 mg/dL — ABNORMAL HIGH (ref 0.76–1.27)
Glucose: 97 mg/dL (ref 70–99)
Potassium: 3.9 mmol/L (ref 3.5–5.2)
Sodium: 137 mmol/L (ref 134–144)
eGFR: 64 mL/min/{1.73_m2} (ref >59)

## 2021-03-13 ENCOUNTER — Other Ambulatory Visit: Payer: Self-pay

## 2021-03-13 DIAGNOSIS — I5020 Unspecified systolic (congestive) heart failure: Secondary | ICD-10-CM

## 2021-03-13 DIAGNOSIS — I25118 Atherosclerotic heart disease of native coronary artery with other forms of angina pectoris: Secondary | ICD-10-CM

## 2021-03-13 DIAGNOSIS — I255 Ischemic cardiomyopathy: Secondary | ICD-10-CM

## 2021-03-13 DIAGNOSIS — R7989 Other specified abnormal findings of blood chemistry: Secondary | ICD-10-CM

## 2021-03-13 DIAGNOSIS — Z951 Presence of aortocoronary bypass graft: Secondary | ICD-10-CM

## 2021-03-13 DIAGNOSIS — Z79899 Other long term (current) drug therapy: Secondary | ICD-10-CM

## 2021-03-13 DIAGNOSIS — Z006 Encounter for examination for normal comparison and control in clinical research program: Secondary | ICD-10-CM

## 2021-03-13 DIAGNOSIS — E785 Hyperlipidemia, unspecified: Secondary | ICD-10-CM

## 2021-03-13 MED ORDER — LOSARTAN POTASSIUM 50 MG PO TABS: 25.0000 mg | ORAL_TABLET | Freq: Every day | ORAL | 3 refills | Status: DC

## 2021-03-14 ENCOUNTER — Other Ambulatory Visit: Payer: Self-pay | Admitting: General Practice

## 2021-03-31 LAB — BASIC METABOLIC PANEL
BUN/Creatinine Ratio: 11 (ref 9–20)
BUN: 14 mg/dL (ref 6–24)
CO2: 25 mmol/L (ref 20–29)
Calcium: 8.8 mg/dL (ref 8.7–10.2)
Chloride: 103 mmol/L (ref 96–106)
Creatinine, Ser: 1.3 mg/dL — ABNORMAL HIGH (ref 0.76–1.27)
Glucose: 87 mg/dL (ref 70–99)
Potassium: 4.1 mmol/L (ref 3.5–5.2)
Sodium: 140 mmol/L (ref 134–144)
eGFR: 67 mL/min/{1.73_m2} (ref >59)

## 2021-04-20 ENCOUNTER — Other Ambulatory Visit (HOSPITAL_BASED_OUTPATIENT_CLINIC_OR_DEPARTMENT_OTHER): Payer: Self-pay | Admitting: Family

## 2021-04-20 NOTE — Telephone Encounter (Signed)
Rx(s) sent to pharmacy electronically.  

## 2021-04-21 ENCOUNTER — Encounter: Payer: Self-pay | Admitting: Cardiovascular Disease

## 2021-04-21 ENCOUNTER — Ambulatory Visit: Payer: 59 | Admitting: Cardiovascular Disease

## 2021-04-21 ENCOUNTER — Other Ambulatory Visit: Payer: Self-pay

## 2021-04-21 DIAGNOSIS — I519 Heart disease, unspecified: Secondary | ICD-10-CM | POA: Diagnosis not present

## 2021-04-21 DIAGNOSIS — E785 Hyperlipidemia, unspecified: Secondary | ICD-10-CM | POA: Insufficient documentation

## 2021-04-21 DIAGNOSIS — I517 Cardiomegaly: Secondary | ICD-10-CM | POA: Insufficient documentation

## 2021-04-21 DIAGNOSIS — E782 Mixed hyperlipidemia: Secondary | ICD-10-CM

## 2021-04-21 DIAGNOSIS — Z951 Presence of aortocoronary bypass graft: Secondary | ICD-10-CM | POA: Diagnosis not present

## 2021-04-21 DIAGNOSIS — I422 Other hypertrophic cardiomyopathy: Secondary | ICD-10-CM | POA: Diagnosis not present

## 2021-04-21 NOTE — Progress Notes (Signed)
? ? ? ?04/21/2021 ?Karie Schwalbe   ?Jun 07, 1970  ?[id number] ? ?Primary Physician Patient, No Pcp Per (Inactive) ?Primary Cardiologist: Lorretta Harp MD Lupe Carney, [place] ? ?HPI:  Roberto Morrison is a 51 y.o. moderately overweight although fit appearing single Caucasian male with no children who I am seeing back for the first time since his bypass graft operation 9 months ago.  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  He has no cardiac risk factors.  Specifically, he does not smoke.  There is no family history.  He presented with a non-STEMI on 07/22/2020.  I performed radial diagnostic cath on him that day revealing three-vessel disease with severe LV dysfunction.  He underwent CABG x3 by Dr. Orvan Seen on 07/26/2020 with bilateral IMA's and I left radial.  Discharged home on 07/30/2020 and has done well since.  His initial EF was in the 30% range with follow-up EF 3 months later in the 40 to 45% range.  He is very active and has no symptoms. ? ? ?Current Meds  ?Medication Sig  ? aspirin EC 81 MG EC tablet Take 1 tablet (81 mg total) by mouth daily. Swallow whole.  ? atorvastatin (LIPITOR) 80 MG tablet TAKE 1 TABLET(80 MG) BY MOUTH DAILY  ? carvedilol (COREG) 6.25 MG tablet TAKE 1 TABLET(6.25 MG) BY MOUTH TWICE DAILY  ? clopidogrel (PLAVIX) 75 MG tablet Take 1 tablet (75 mg total) by mouth daily.  ? empagliflozin (JARDIANCE) 10 MG TABS tablet Take 1 tablet (10 mg total) by mouth daily before breakfast.  ? losartan (COZAAR) 50 MG tablet Take 0.5 tablets (25 mg total) by mouth daily.  ? tacrolimus (PROTOPIC) 0.1 % ointment Apply topically 2 (two) times daily.  ?  ? ?Allergies  ?Allergen Reactions  ? Prednisone Other (See Comments)  ?  "Hiccups" ?  ? ? ?Social History  ? ?Socioeconomic History  ? Marital status: Single  ?  Spouse name: Not on file  ? Number of children: Not on file  ? Years of education: Not on file  ? Highest education level: Not on file  ?Occupational History   ? Not on file  ?Tobacco Use  ? Smoking status: Never  ? Smokeless tobacco: Never  ?Substance and Sexual Activity  ? Alcohol use: Not Currently  ? Drug use: Never  ? Sexual activity: Not on file  ?Other Topics Concern  ? Not on file  ?Social History Narrative  ? Not on file  ? ?Social Determinants of Health  ? ?Financial Resource Strain: Not on file  ?Food Insecurity: Not on file  ?Transportation Needs: Not on file  ?Physical Activity: Not on file  ?Stress: Not on file  ?Social Connections: Not on file  ?Intimate Partner Violence: Not on file  ?  ? ?Review of Systems: ?General: negative for chills, fever, night sweats or weight changes.  ?Cardiovascular: negative for chest pain, dyspnea on exertion, edema, orthopnea, palpitations, paroxysmal nocturnal dyspnea or shortness of breath ?Dermatological: negative for rash ?Respiratory: negative for cough or wheezing ?Urologic: negative for hematuria ?Abdominal: negative for nausea, vomiting, diarrhea, bright red blood per rectum, melena, or hematemesis ?Neurologic: negative for visual changes, syncope, or dizziness ?All other systems reviewed and are otherwise negative except as noted above. ? ? ? ?Blood pressure 126/90, pulse 73, height 6' (1.829 m), weight 284 lb (128.8 kg), SpO2 97 %.  ?General appearance: alert and no distress ?Neck: no adenopathy, no carotid bruit, no JVD,  supple, symmetrical, trachea midline, and thyroid not enlarged, symmetric, no tenderness/mass/nodules ?Lungs: clear to auscultation bilaterally ?Heart: regular rate and rhythm, S1, S2 normal, no murmur, click, rub or gallop ?Extremities: extremities normal, atraumatic, no cyanosis or edema ?Pulses: 2+ and symmetric ?Skin: Skin color, texture, turgor normal. No rashes or lesions ?Neurologic: Grossly normal ? 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  I personally reviewed this EKG. ? ?ASSESSMENT AND PLAN:  ? ?S/P CABG x 3 ?History of CAD status post non-STEMI 07/22/2020 with  cardiac catheterization performed by myself revealing severe LAD and circumflex disease with moderately severe LV dysfunction and EF in the 30 to 35% range.  He underwent coronary artery bypass grafting x3 by Dr. Julien Girt on 07/26/2020 with a LIMA to the LAD, pedicle RIMA to the PDA and a left radial to the obtuse marginal branch.  He was discharged home on 07/30/2020 and has revealed rehabilitated nicely.  He is very active and exercises 5 days a week.  He denies chest pain or shortness of breath. ? 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  His most recent echo performed 3 months later, 9/13//22 revealed an EF of 40 to 45%.  He is on low-dose losartan and carvedilol.  He does have moderate renal insufficiency.  We will recheck a 2D echocardiogram. ? ?Asymmetric septal hypertrophy ([place]) ?His most recent 2D echo performed 10/18/2020 revealed asymmetric septal hypertrophy with a septal thickness of greater than 16 mm.  I am current going to get a cardiac MRI to further evaluate. ? ?Hyperlipidemia ?History of hyperlipidemia on statin therapy with lipid profile performed 04/16/2021 revealing a total cholesterol 105, LDL of 55 and HDL 32. ? ? ? ? ?Lorretta Harp MD FACP,FACC,FAHA, FSCAI ?04/21/2021 ?9:51 AM ?

## 2021-04-21 NOTE — Patient Instructions (Addendum)
Medication Instructions:  ?No changes ?*If you need a refill on your cardiac medications before your next appointment, please call your pharmacy* ? ? ?Lab Work: ?Labs will be needed for the MRI ? ?If you have labs (blood work) drawn today and your tests are completely normal, you will receive your results only by: ?MyChart Message (if you have MyChart) OR ?A paper copy in the mail ?If you have any lab test that is abnormal or we need to change your treatment, we will call you to review the results. ? ? ?Testing/Procedures: ?Your physician has requested that you have an echocardiogram. Echocardiography is a painless test that uses sound waves to create images of your heart. It provides your doctor with information about the size and shape of your heart and how well your heart?s chambers and valves are working. You may receive an ultrasound enhancing agent through an IV if needed to better visualize your heart during the echo.This procedure takes approximately one hour. There are no restrictions for this procedure. This will take place at the [address], [address].  ? ? ? ?Follow-Up: ?At [facility], you and your health needs are our priority.  As part of our continuing mission to provide you with exceptional heart care, we have created designated Provider Care Teams.  These Care Teams include your primary Cardiologist (physician) and Advanced Practice Providers (APPs -  Physician Assistants and Nurse Practitioners) who all work together to provide you with the care you need, when you need it. ? ?We recommend signing up for the patient portal called "MyChart".  Sign up information is provided on this After Visit Summary.  MyChart is used to connect with patients for Virtual Visits (Telemedicine).  Patients are able to view lab/test results, encounter notes, upcoming appointments, etc.  Non-urgent messages can be sent to your provider as well.   ?To learn more about what you can do with MyChart, go to  [email or web address].   ? ?Your next appointment:   ?Follow up in 6 months with Coletta Memos, NP ?Follow up in 12 months with Dr. Gwenlyn Found ? ?Other Instructions ? ? ?You are scheduled for Cardiac MRI on ______________. Please arrive at the [facility] main entrance of [facility] at ________________ (30-45 minutes prior to test start time). ? ? ?[facility] ?[address] ?[place], [place] [address] ?[phone] ? ?Please take advantage of the free valet parking available at the MAIN entrance (A entrance). Proceed to the [facility] Radiology Department (First Floor). ?? ?Magnetic resonance imaging (MRI) is a painless test that produces images of the inside of the body without using Xrays. ? ?During an MRI, strong magnets and radio waves work together in a Research officer, political party to form detailed images.  ? ?MRI images may provide more details about a medical condition than X-rays, CT scans, and ultrasounds can provide. ? ?You may be given earphones to listen for instructions. ? ?You may eat a light breakfast and take medications as ordered with the exception of HCTZ (fluid pill, other). Please avoid stimulants for 12 hr prior to test. (Ie. Caffeine, nicotine, chocolate, or antihistamine medications) ? ?If a contrast material will be used, an IV will be inserted into one of your veins. Contrast material will be injected into your IV. It will leave your body through your urine within a day. You may be told to drink plenty of fluids to help flush the contrast material out of your system. ? ?You will be asked  to remove all metal, including: Watch, jewelry, and other metal objects including hearing aids, hair pieces and dentures. Also wearable glucose monitoring systems (ie. Freestyle Libre and Omnipods) (Braces and fillings normally are not a problem.) ? ? ?TEST WILL TAKE APPROXIMATELY 1 HOUR ? ?PLEASE NOTIFY SCHEDULING AT LEAST 24 HOURS IN ADVANCE IF YOU ARE UNABLE TO KEEP YOUR  APPOINTMENT. ?[phone] ? ?Please call Marchia Bond, cardiac imaging nurse navigator with any questions/concerns. ?Marchia Bond RN Navigator Cardiac Imaging ?Gordy Clement RN Navigator Cardiac Imaging ?[facility] ?[phone] Office  ? ? ?

## 2021-04-21 NOTE — Assessment & Plan Note (Signed)
His most recent 2D echo performed 10/18/2020 revealed asymmetric septal hypertrophy with a septal thickness of greater than 16 mm.  I am current going to get a cardiac MRI to further evaluate. ?

## 2021-04-21 NOTE — Assessment & Plan Note (Signed)
History of CAD status post non-STEMI 07/22/2020 with cardiac catheterization performed by myself revealing severe LAD and circumflex disease with moderately severe LV dysfunction and EF in the 30 to 35% range.  He underwent coronary artery bypass grafting x3 by Dr. Julien Girt on 07/26/2020 with a LIMA to the LAD, pedicle RIMA to the PDA and a left radial to the obtuse marginal branch.  He was discharged home on 07/30/2020 and has revealed rehabilitated nicely.  He is very active and exercises 5 days a week.  He denies chest pain or shortness of breath. ?

## 2021-04-21 NOTE — Assessment & Plan Note (Signed)
History of hyperlipidemia on statin therapy with lipid profile performed 04/16/2021 revealing a total cholesterol 105, LDL of 55 and HDL 32. ?

## 2021-04-21 NOTE — Assessment & Plan Note (Signed)
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  His most recent echo performed 3 months later, 9/13//22 revealed an EF of 40 to 45%.  He is on low-dose losartan and carvedilol.  He does have moderate renal insufficiency.  We will recheck a 2D echocardiogram. ?

## 2021-05-01 ENCOUNTER — Other Ambulatory Visit: Payer: Self-pay

## 2021-05-01 ENCOUNTER — Ambulatory Visit (HOSPITAL_COMMUNITY): Payer: 59 | Attending: Cardiology

## 2021-05-01 DIAGNOSIS — I517 Cardiomegaly: Secondary | ICD-10-CM

## 2021-05-01 DIAGNOSIS — I422 Other hypertrophic cardiomyopathy: Secondary | ICD-10-CM | POA: Insufficient documentation

## 2021-05-01 LAB — ECHOCARDIOGRAM COMPLETE
Area-P 1/2: 3.37 cm2
S' Lateral: 4.2 cm

## 2021-05-04 ENCOUNTER — Telehealth: Payer: Self-pay | Admitting: *Deleted

## 2021-05-04 DIAGNOSIS — C4359 Malignant melanoma of other part of trunk: Secondary | ICD-10-CM | POA: Insufficient documentation

## 2021-05-04 DIAGNOSIS — D225 Melanocytic nevi of trunk: Secondary | ICD-10-CM | POA: Insufficient documentation

## 2021-05-04 NOTE — Telephone Encounter (Signed)
? ?  Pre-operative Risk Assessment  ?  ?Patient Name: Roberto Morrison  ?DOB: 20-Dec-1970 ?MRN: [id number]  ? ?  ? ?Request for Surgical Clearance   ? ?Procedure:   WIDE EXCSION [place] LEFT SCAPULA BACK  ? ?Date of Surgery:  Clearance 05/16/21                              ?   ?Surgeon:  DR Hazle Quant ?Surgeon's Group or Practice Name:  [facility] ?Phone number:  [phone] ?Fax number:  [phone] ?  ?Type of Clearance Requested:   ?- Medical  ?- Pharmacy:  Hold Clopidogrel (Plavix) 5 DAYS ?  ?Type of Anesthesia:  General  ?{ ?Devra Dopp   ?05/04/2021, 3:38 PM  ? ?

## 2021-05-04 NOTE — Telephone Encounter (Signed)
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  He was last seen in the clinic on 04/21/2021.  During that time he continued to be stable from a cardiac standpoint. ? ? ?His PMH includes non-STEMI on 07/22/2020.   Diagnostic cath on  that day revealing three-vessel disease with severe LV dysfunction.  He underwent CABG x3 by Dr. Orvan Seen on 07/26/2020 with bilateral IMA's and left radial.  Discharged home on 07/30/2020 and has done well since.  His initial EF was in the 30% range with follow-up EF 3 months later in the 40 to 45% range.  ? ? ?May his Plavix be held prior to his procedure? ? ?Thank you for your help.  Please direct your response to CV DIV preop pool. ? ?Roberto Ng. Lorenzo Pereyra NP-C ? ?  ?05/04/2021, 4:29 PM ?[place] ?[address] ?Office [phone] Fax [phone] ? ?

## 2021-05-05 NOTE — Telephone Encounter (Signed)
? ?  Primary Cardiologist: Quay Burow, MD ? ?Chart reviewed as part of pre-operative protocol coverage. Given past medical history and time since last visit, based on ACC/AHA guidelines, MANDO BLATZ would be at acceptable risk for the planned procedure without further cardiovascular testing.  ? ?His Plavix may be held for 5 days prior to his procedure.  Please resume as soon as hemostasis is achieved. ? ?I will route this recommendation to the requesting party via Epic fax function and remove from pre-op pool. ? ?Please call with questions. ? ?Jossie Ng. Gianlucas Evenson NP-C ? ?  ?05/05/2021, 11:07 AM ?[place] [facility] ?[address] ?Office [phone] Fax [phone] ? ? ? ? ?

## 2021-05-19 ENCOUNTER — Telehealth (HOSPITAL_COMMUNITY): Payer: Self-pay | Admitting: Emergency Medicine

## 2021-05-19 NOTE — Telephone Encounter (Signed)
Test run away ?

## 2021-05-19 NOTE — Telephone Encounter (Signed)
There is no good alternative test for the info we get from MRI. ?

## 2021-05-19 NOTE — Telephone Encounter (Signed)
Reaching out to patient to offer assistance regarding upcoming cardiac imaging study; pt verbalizes understanding of appt date/time, parking situation and where to check in, pre-test NPO status and medications ordered, and verified current allergies; name and call back number provided for further questions should they arise ?Marchia Bond RN Navigator Cardiac Imaging ?[facility] ?[phone] office ?[phone] cell ? ? 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 He is willing to come in today to 'test run' to see if he will fit before confirming/cancelling appt.  ?

## 2021-05-22 ENCOUNTER — Ambulatory Visit (HOSPITAL_COMMUNITY)
Admission: RE | Admit: 2021-05-22 | Discharge: 2021-05-22 | Disposition: A | Discharge status: Home / Self Care | Payer: 59 | Source: Ambulatory Visit | Attending: Cardiovascular Disease | Admitting: Cardiovascular Disease

## 2021-05-22 DIAGNOSIS — I422 Other hypertrophic cardiomyopathy: Secondary | ICD-10-CM | POA: Insufficient documentation

## 2021-05-22 MED ORDER — GADOBUTROL 1 MMOL/ML IV SOLN
10.0000 mL | Freq: Once | INTRAVENOUS | Status: AC | PRN
  Administered 2021-05-22: 10 mL via INTRAVENOUS

## 2021-06-03 DIAGNOSIS — L0390 Cellulitis, unspecified: Secondary | ICD-10-CM | POA: Insufficient documentation

## 2021-06-06 DIAGNOSIS — L03312 Cellulitis of back [any part except buttock]: Secondary | ICD-10-CM | POA: Insufficient documentation

## 2021-06-16 ENCOUNTER — Encounter: Payer: Self-pay | Admitting: Medical-Surgical

## 2021-06-16 ENCOUNTER — Ambulatory Visit: Payer: 59 | Admitting: Medical-Surgical

## 2021-06-16 VITALS — BP 131/85 | HR 72 | Resp 20 | Ht 72.0 in | Wt 280.3 lb

## 2021-06-16 DIAGNOSIS — Z1211 Encounter for screening for malignant neoplasm of colon: Secondary | ICD-10-CM | POA: Diagnosis not present

## 2021-06-16 DIAGNOSIS — E782 Mixed hyperlipidemia: Secondary | ICD-10-CM

## 2021-06-16 DIAGNOSIS — Z7689 Persons encountering health services in other specified circumstances: Secondary | ICD-10-CM | POA: Diagnosis not present

## 2021-06-16 DIAGNOSIS — S21209D Unspecified open wound of unspecified back wall of thorax without penetration into thoracic cavity, subsequent encounter: Secondary | ICD-10-CM

## 2021-06-16 DIAGNOSIS — Z951 Presence of aortocoronary bypass graft: Secondary | ICD-10-CM | POA: Diagnosis not present

## 2021-06-16 NOTE — Progress Notes (Signed)
? ?New Patient Office Visit ? ?Subjective:  ?Patient ID: Roberto Morrison, male    DOB: 1970-09-27  Age: 51 y.o. MRN: [id number] ? ?CC:  ?Chief Complaint  ?Patient presents with  ? Establish Care  ? ?HPI ?Roberto Morrison presents to establish care.  ? ?Cardiology- s/p CABG x 3 in June 2022. Being followed by Cardiology but would like to find a new provider since his previous one left. Compliant with medications. Exercises regularly competing in body building.  ? ?Requesting a referral to GI for colon cancer screening.  ? ?Recent melanoma surgery on his back. Having to change the dressings daily but having a hard time due to the location. Has been trying to find someone each day to help him with the dressings. Due for a dressing change today. Would like to have that done here. Currently finishing up a course of Doxycycline.  ? ?History reviewed. No pertinent past medical history. ? ?Past Surgical History:  ?Procedure Laterality Date  ? CORONARY ARTERY BYPASS GRAFT N/A 07/26/2020  ? Procedure: CORONARY ARTERY BYPASS GRAFTING (CABG) X THREE, USING LEFT INTERNAL MAMMARY ARTERY< RIGHT INTERNAL MAMMARY ARTERY AND LEFT RADIAL ARTERY CONDUITS.;  Surgeon: Wonda Olds, MD;  Location: [facility];  Service: Open Heart Surgery;  Laterality: N/A;  ? INTERCOSTAL NERVE BLOCK  07/26/2020  ? Procedure: INTERCOSTAL NERVE BLOCK;  Surgeon: Wonda Olds, MD;  Location: [facility];  Service: Open Heart Surgery;;  ? LEFT HEART CATH AND CORONARY ANGIOGRAPHY N/A 07/22/2020  ? Procedure: LEFT HEART CATH AND CORONARY ANGIOGRAPHY;  Surgeon: Lorretta Harp, MD;  Location: [facility];  Service: Cardiovascular;  Laterality: N/A;  ? RADIAL ARTERY HARVEST Left 07/26/2020  ? Procedure: RADIAL ARTERY HARVEST;  Surgeon: Wonda Olds, MD;  Location: [place] [facility];  Service: Open Heart Surgery;  Laterality: Left;  ? TEE WITHOUT CARDIOVERSION N/A 07/26/2020  ? Procedure: TRANSESOPHAGEAL ECHOCARDIOGRAM (TEE);  Surgeon: Wonda Olds, MD;  Location: [place] [facility];   Service: Open Heart Surgery;  Laterality: N/A;  ? ? ?History reviewed. No pertinent family history. ? ?Social History  ? ?Socioeconomic History  ? Marital status: Single  ?  Spouse name: Not on file  ? Number of children: Not on file  ? Years of education: Not on file  ? Highest education level: Not on file  ?Occupational History  ? Not on file  ?Tobacco Use  ? Smoking status: Never  ? Smokeless tobacco: Never  ?Substance and Sexual Activity  ? Alcohol use: Not Currently  ? Drug use: Never  ? Sexual activity: Not on file  ?Other Topics Concern  ? Not on file  ?Social History Narrative  ? Not on file  ? ?Social Determinants of Health  ? ?Financial Resource Strain: Not on file  ?Food Insecurity: Not on file  ?Transportation Needs: Not on file  ?Physical Activity: Not on file  ?Stress: Not on file  ?Social Connections: Not on file  ?Intimate Partner Violence: Not on file  ? ? ?ROS ?Review of Systems  ?Constitutional:  Negative for chills, fatigue, fever and unexpected weight change.  ?HENT:  Negative for congestion, rhinorrhea, sinus pressure and sore throat.   ?Eyes:  Negative for visual disturbance.  ?Respiratory:  Negative for cough, chest tightness, shortness of breath and wheezing.   ?Cardiovascular:  Negative for chest pain, palpitations and leg swelling.  ?Gastrointestinal:  Negative for abdominal pain, constipation, diarrhea, nausea and vomiting.  ?Genitourinary:  Negative for dysuria, frequency and urgency.  ?Skin:  Positive for wound (right upper back, midline lower back x 2). Negative for rash.  ?Neurological:  Negative for dizziness, light-headedness and headaches.  ?Psychiatric/Behavioral:  Negative for dysphoric mood, self-injury, sleep disturbance and suicidal ideas. The patient is not nervous/anxious.   ? ?Objective:  ? ?Today's Vitals: BP 131/85 (BP Location: Right Arm) Comment (Cuff Size): Thigh 13"  Pulse 72   Resp 20   Ht 6' (1.829 m)   Wt 280 lb 4.8 oz (127.1 kg)   SpO2 96%   BMI 38.02 kg/m?   ? ?Physical Exam ?Vitals and nursing note reviewed.  ?Constitutional:   ?   General: He is not in acute distress. ?   Appearance: Normal appearance. He is not ill-appearing.  ?HENT:  ?   Head: Normocephalic and atraumatic.  ?Cardiovascular:  ?   Rate and Rhythm: Normal rate and regular rhythm.  ?   Pulses: Normal pulses.  ?   Heart sounds: Normal heart sounds. No murmur heard. ?  No friction rub. No gallop.  ?Pulmonary:  ?   Effort: Pulmonary effort is normal. No respiratory distress.  ?   Breath sounds: Normal breath sounds.  ?Skin: ?   General: Skin is warm and dry.  ?   Findings: Wound present.  ? ?    ?Neurological:  ?   Mental Status: He is alert and oriented to person, place, and time.  ?Psychiatric:     ?   Mood and Affect: Mood normal.     ?   Behavior: Behavior normal.     ?   Thought Content: Thought content normal.     ?   Judgment: Judgment normal.  ? ? ?Assessment & Plan:  ? ?1. Encounter to establish care ?Reviewed available information and discussed care concerns with patient.  ? ?2. Colon cancer screening ?Referring to GI.  ?- Ambulatory referral to Gastroenterology ? ?3. S/P CABG x 3 ?Referring to Cardiology. No refills needed today. No change in medication.  ?- Ambulatory referral to Cardiology ? ?4. Mixed hyperlipidemia ?Continue medication as prescribed. Referring to Cardiology.  ?- Ambulatory referral to Cardiology ? ?5. Wound of back ?Dressings changed at patient request. Managed by surgeon.  ? ? ?Outpatient Encounter Medications as of 06/16/2021  ?Medication Sig  ? aspirin EC 81 MG EC tablet Take 1 tablet (81 mg total) by mouth daily. Swallow whole.  ? atorvastatin (LIPITOR) 80 MG tablet TAKE 1 TABLET(80 MG) BY MOUTH DAILY  ? carvedilol (COREG) 6.25 MG tablet TAKE 1 TABLET(6.25 MG) BY MOUTH TWICE DAILY  ? clopidogrel (PLAVIX) 75 MG tablet Take 1 tablet (75 mg total) by mouth daily.  ? empagliflozin (JARDIANCE) 10 MG TABS tablet Take 1 tablet (10 mg total) by mouth daily before breakfast.  ?  losartan (COZAAR) 50 MG tablet Take 0.5 tablets (25 mg total) by mouth daily.  ? [DISCONTINUED] sulfamethoxazole-trimethoprim (BACTRIM DS) 800-160 MG tablet Take 1 tablet by mouth 2 (two) times daily. (Patient not taking: Reported on 04/21/2021)  ? [DISCONTINUED] tacrolimus (PROTOPIC) 0.1 % ointment Apply topically 2 (two) times daily. (Patient not taking: Reported on 06/16/2021)  ? ?No facility-administered encounter medications on file as of 06/16/2021.  ? ? ?Follow-up: Return in about 1 year (around 06/17/2022) for annual physical exam (or sooner if needed).  ? ?Clearnce Sorrel, DNP, APRN, FNP-BC ?[place] [facility] ? ?

## 2021-06-19 ENCOUNTER — Telehealth: Payer: Self-pay | Admitting: Cardiovascular Disease

## 2021-06-19 NOTE — Telephone Encounter (Signed)
Patient requesting to switch from Dr. Gwenlyn Found to Dr. Gasper Sells.  ?

## 2021-06-25 NOTE — Progress Notes (Signed)
Cardiology Office Note:    Date:  06/26/2021   ID:  Roberto Morrison, DOB Jul 04, 1970, MRN [id number]  PCP:  Samuel Bouche, NP   [facility] Providers Cardiologist:  Quay Burow, MD     Referring MD: Samuel Bouche, NP   CC:HCM Consulted for the evaluation of  at the behest of Samuel Bouche, NP  History of Present Illness:    Roberto Morrison is a 51 y.o. male with a hx CAD s/p CABG LIMA to the LAD, pedicle RIMA to the PDA and a left radial to the obtuse marginal branch. Ischemic cardiomyopathy. Thought his imaging found to have elevated LV thickening with VL dilation.  Seen for evaluation of HCM vs Athlete's Heart.  Patient notes no SOB at rest and no DOE Notes no fatigue. Notes no palpitations Notes no CP. Notes no Dizziness. Notes no syncope.  No family history of SCD or HCM  He is on Coreg, Jardiance, and losartan.    He was won [employer] [place] in the past.  He recently won [employer] in [place].  He competes on a regular basis.  His endocrinologist in [place] asked about HCM.   History reviewed. No pertinent past medical history.  Past Surgical History:  Procedure Laterality Date   CORONARY ARTERY BYPASS GRAFT N/A 07/26/2020   Procedure: CORONARY ARTERY BYPASS GRAFTING (CABG) X THREE, USING LEFT INTERNAL MAMMARY ARTERY< RIGHT INTERNAL MAMMARY ARTERY AND LEFT RADIAL ARTERY CONDUITS.;  Surgeon: Wonda Olds, MD;  Location: [place];  Service: Open Heart Surgery;  Laterality: N/A;   INTERCOSTAL NERVE BLOCK  07/26/2020   Procedure: INTERCOSTAL NERVE BLOCK;  Surgeon: Wonda Olds, MD;  Location: [place] OR;  Service: Open Heart Surgery;;   LEFT HEART CATH AND CORONARY ANGIOGRAPHY N/A 07/22/2020   Procedure: LEFT HEART CATH AND CORONARY ANGIOGRAPHY;  Surgeon: Lorretta Harp, MD;  Location: [place] [facility];  Service: Cardiovascular;  Laterality: N/A;   RADIAL ARTERY HARVEST Left 07/26/2020   Procedure: RADIAL ARTERY HARVEST;  Surgeon: Wonda Olds, MD;  Location: [place];  Service: Open Heart Surgery;  Laterality: Left;   TEE WITHOUT CARDIOVERSION N/A 07/26/2020   Procedure: TRANSESOPHAGEAL ECHOCARDIOGRAM (TEE);  Surgeon: Wonda Olds, MD;  Location: [place];  Service: Open Heart Surgery;  Laterality: N/A;    Current Medications: Current Meds  Medication Sig   aspirin EC 81 MG EC tablet Take 1 tablet (81 mg total) by mouth daily. Swallow whole.   atorvastatin (LIPITOR) 80 MG tablet TAKE 1 TABLET(80 MG) BY MOUTH DAILY   carvedilol (COREG) 6.25 MG tablet TAKE 1 TABLET(6.25 MG) BY MOUTH TWICE DAILY   empagliflozin (JARDIANCE) 10 MG TABS tablet Take 1 tablet (10 mg total) by mouth daily before breakfast.   [DISCONTINUED] clopidogrel (PLAVIX) 75 MG tablet Take 1 tablet (75 mg total) by mouth daily.     Allergies:   Prednisone   Social History   Socioeconomic History   Marital status: Single    Spouse name: Not on file   Number of children: Not on file   Years of education: Not on file   Highest education level: Not on file  Occupational History   Not on file  Tobacco Use   Smoking status: Never   Smokeless tobacco: Never  Substance and Sexual Activity   Alcohol use: Not Currently   Drug use: Never   Sexual activity: Not on file  Other Topics Concern   Not on file  Social History  Narrative   Not on file   Social Determinants of Health   Financial Resource Strain: Not on file  Food Insecurity: Not on file  Transportation Needs: Not on file  Physical Activity: Not on file  Stress: Not on file  Social Connections: Not on file     Family History: History of coronary artery disease notable for no members. History of heart failure notable for no members. History of arrhythmia notable for no members. Denies family history of sudden cardiac death including drowning, car accidents, or unexplained deaths in the family. No history of bicuspid aortic valve or aortic aneurysm or dissection.   No history of cardiomyopathies including  hypertrophic cardiomyopathy, left ventricular non-compaction, or arrhythmogenic right ventricular cardiomyopathy.   ROS:   Please see the history of present illness.     All other systems reviewed and are negative.  EKGs/Labs/Other Studies Reviewed:    The following studies were reviewed today:   EKG: 3/17/23SR LAFB  Recent Labs: 07/27/2020: Magnesium 2.2 09/12/2020: ALT 35; Hemoglobin 16.5; Platelets 216 03/30/2021: BUN 14; Creatinine, Ser 1.30; Potassium 4.1; Sodium 140  Recent Lipid Panel    Component Value Date/Time   CHOL 113 09/12/2020 0816   TRIG 99 09/12/2020 0816   HDL 33 (L) 09/12/2020 0816   CHOLHDL 3.4 09/12/2020 0816   CHOLHDL 5.3 07/25/2020 0928   VLDL 14 07/25/2020 0928   LDLCALC 61 09/12/2020 0816        Physical Exam:    VS:  BP 124/82   Pulse 74   Ht 6' (1.829 m)   Wt 280 lb 9.6 oz (127.3 kg)   SpO2 95%   BMI 38.06 kg/m     Wt Readings from Last 3 Encounters:  06/26/21 280 lb 9.6 oz (127.3 kg)  06/16/21 280 lb 4.8 oz (127.1 kg)  04/21/21 284 lb (128.8 kg)     Gen: no distress    Neck: No JVD  Cardiac: No Rubs or Gallops, no Murmur, RRR +2 Right radial pulse, L radial scar  Respiratory: Clear to auscultation bilaterally, normal effort, normal  respiratory rate GI: Soft, nontender, non-distended  MS: No  edema;  moves all extremities Integument: Skin feels warm Neuro:  At time of evaluation, alert and oriented to person/place/time/situation  Psych: Normal affect, patient feels well   ASSESSMENT:    1. Hypertrophic cardiomyopathy (HCC)    PLAN:    Hypertrophic Cardiomyopathy versus Athlete's Heart Ischemic cardiomyopathy controlled on Jardiance 10, coreg 6.25 mg PO BID, and 25 mg PO daily losartan, LVEF 39% CAD s/p CABG (all arterial) will stop plavix and continue ASA Mild AI next echo in 2026  NYHA Class I  Deconditioning is offered; we discussed six months of decondition then repeat MRI; givet the nature of his work, we have  discussed other options in shared decision making  - Given that we are not pursuing the above, we will test as if he had HCM for SCD eval: POET and heart monitor - if negative we will plan for no barriers to his exercise.  If elevated SCD risk, we will re-discuss deconditioning  Six months with me  Time Spent Directly with Patient:   I have spent a total of 40 minutes with the patient reviewing notes, imaging, EKGs, labs and examining the patient as well as establishing an assessment and plan that was discussed personally with the patient.  > 50% of time was spent in direct patient care  and reviewing imaging with patient.  Shared Decision Making/Informed Consent The risks [chest pain, shortness of breath, cardiac arrhythmias, dizziness, blood pressure fluctuations, myocardial infarction, stroke/transient ischemic attack, and life-threatening complications (estimated to be 1 in 10,000)], benefits (risk stratification, diagnosing coronary artery disease, treatment guidance) and alternatives of an exercise tolerance test were discussed in detail with Mr. Clapper and he agrees to proceed.    Medication Adjustments/Labs and Tests Ordered: Current medicines are reviewed at length with the patient today.  Concerns regarding medicines are outlined above.  Orders Placed This Encounter  Procedures   LONG TERM MONITOR (3-14 DAYS)   EXERCISE TOLERANCE TEST (ETT)   No orders of the defined types were placed in this encounter.   Patient Instructions  Medication Instructions:  Your physician has recommended you make the following change in your medication:  STOP: clopidogrel (Plavix)   *If you need a refill on your cardiac medications before your next appointment, please call your pharmacy*   Lab Work: NONE If you have labs (blood work) drawn today and your tests are completely normal, you will receive your results only by: [place] (if you have MyChart) OR A paper copy in the  mail If you have any lab test that is abnormal or we need to change your treatment, we will call you to review the results.   Testing/Procedures: Your physician has requested that you have an exercise tolerance test. For further information please visit [email or web address]. Please also follow instruction sheet, as given.  Your physician has requested that you wear a 14 day heart monitor.    Follow-Up: At [facility], you and your health needs are our priority.  As part of our continuing mission to provide you with exceptional heart care, we have created designated Provider Care Teams.  These Care Teams include your primary Cardiologist (physician) and Advanced Practice Providers (APPs -  Physician Assistants and Nurse Practitioners) who all work together to provide you with the care you need, when you need it.   Your next appointment:   6 month(s)  The format for your next appointment:   In Person  Provider:   Rudean Haskell, MD     Other Instructions Bryn Gulling- Long Term Monitor Instructions  Your physician has requested you wear a ZIO patch monitor for 14 days.  This is a single patch monitor. [name] supplies one patch monitor per enrollment. Additional stickers are not available. Please do not apply patch if you will be having a Nuclear Stress Test,  Echocardiogram, Cardiac CT, MRI, or Chest Xray during the period you would be wearing the  monitor. The patch cannot be worn during these tests. You cannot remove and re-apply the  [name] patch monitor.  Your ZIO patch monitor will be mailed 3 day USPS to your address on file. It may take 3-5 days  to receive your monitor after you have been enrolled.  Once you have received your monitor, please review the enclosed instructions. Your monitor  has already been registered assigning a specific monitor serial # to you.  Billing and Patient Assistance Program Information  We have supplied [name] with any of your insurance  information on file for billing purposes. [name] offers a sliding scale Patient Assistance Program for patients that do not have  insurance, or whose insurance does not completely cover the cost of the ZIO monitor.  You must apply for the Patient Assistance Program to qualify for this discounted rate.  To apply, please call [name] at [phone], select option 4, select option 2,  ask to apply for  Patient Assistance Program. Theodore Demark will ask your household income, and how many people  are in your household. They will quote your out-of-pocket cost based on that information.  [name] will also be able to set up a 73-month interest-free payment plan if needed.  Applying the monitor   Shave hair from upper left chest.  Hold abrader disc by orange tab. Rub abrader in 40 strokes over the upper left chest as  indicated in your monitor instructions.  Clean area with 4 enclosed alcohol pads. Let dry.  Apply patch as indicated in monitor instructions. Patch will be placed under collarbone on left  side of chest with arrow pointing upward.  Rub patch adhesive wings for 2 minutes. Remove white label marked "1". Remove the white  label marked "2". Rub patch adhesive wings for 2 additional minutes.  While looking in a mirror, press and release button in center of patch. A small green light will  flash 3-4 times. This will be your only indicator that the monitor has been turned on.  Do not shower for the first 24 hours. You may shower after the first 24 hours.  Press the button if you feel a symptom. You will hear a small click. Record Date, Time and  Symptom in the Patient Logbook.  When you are ready to remove the patch, follow instructions on the last 2 pages of Patient  Logbook. Stick patch monitor onto the last page of Patient Logbook.  Place Patient Logbook in the blue and white box. Use locking tab on box and tape box closed  securely. The blue and white box has prepaid postage on it. Please  place it in the mailbox as  soon as possible. Your physician should have your test results approximately 7 days after the  monitor has been [facility]  Call [employer]at [phone] you have questions regarding  your [name] patch monitor. Call them immediately if you see an orange light blinking on your  monitor.  If your monitor falls off in less than 4 days, contact our Monitor department at [phone]  If your monitor becomes loose or falls off after 4 days call [name] at [phone]  suggestions on securing your monitor   Important Information About Sugar         Signed, MWerner Lean MD  06/26/2021 10:34 AM    [place]

## 2021-06-26 ENCOUNTER — Other Ambulatory Visit: Payer: Self-pay | Admitting: Internal Medicine

## 2021-06-26 ENCOUNTER — Encounter: Payer: Self-pay | Admitting: Internal Medicine

## 2021-06-26 ENCOUNTER — Ambulatory Visit (INDEPENDENT_AMBULATORY_CARE_PROVIDER_SITE_OTHER): Payer: 59

## 2021-06-26 ENCOUNTER — Ambulatory Visit: Payer: 59 | Admitting: Internal Medicine

## 2021-06-26 VITALS — BP 124/82 | HR 74 | Ht 72.0 in | Wt 280.6 lb

## 2021-06-26 DIAGNOSIS — I422 Other hypertrophic cardiomyopathy: Secondary | ICD-10-CM

## 2021-06-26 DIAGNOSIS — I493 Ventricular premature depolarization: Secondary | ICD-10-CM | POA: Diagnosis not present

## 2021-06-26 NOTE — Progress Notes (Unsigned)
[id number] [name] from office inventory applied to patient.

## 2021-06-26 NOTE — Patient Instructions (Addendum)
Medication Instructions:  Your physician has recommended you make the following change in your medication:  STOP: clopidogrel (Plavix)   *If you need a refill on your cardiac medications before your next appointment, please call your pharmacy*   Lab Work: NONE If you have labs (blood work) drawn today and your tests are completely normal, you will receive your results only by: [place] (if you have MyChart) OR A paper copy in the mail If you have any lab test that is abnormal or we need to change your treatment, we will call you to review the results.   Testing/Procedures: Your physician has requested that you have an exercise tolerance test. For further information please visit [email or web address]. Please also follow instruction sheet, as given.  Your physician has requested that you wear a 14 day heart monitor.    Follow-Up: At [facility], you and your health needs are our priority.  As part of our continuing mission to provide you with exceptional heart care, we have created designated Provider Care Teams.  These Care Teams include your primary Cardiologist (physician) and Advanced Practice Providers (APPs -  Physician Assistants and Nurse Practitioners) who all work together to provide you with the care you need, when you need it.   Your next appointment:   6 month(s)  The format for your next appointment:   In Person  Provider:   Rudean Haskell, MD     Other Instructions Bryn Gulling- Long Term Monitor Instructions  Your physician has requested you wear a ZIO patch monitor for 14 days.  This is a single patch monitor. [name] supplies one patch monitor per enrollment. Additional stickers are not available. Please do not apply patch if you will be having a Nuclear Stress Test,  Echocardiogram, Cardiac CT, MRI, or Chest Xray during the period you would be wearing the  monitor. The patch cannot be worn during these tests. You cannot remove and re-apply the   [name] patch monitor.  Your ZIO patch monitor will be mailed 3 day USPS to your address on file. It may take 3-5 days  to receive your monitor after you have been enrolled.  Once you have received your monitor, please review the enclosed instructions. Your monitor  has already been registered assigning a specific monitor serial # to you.  Billing and Patient Assistance Program Information  We have supplied [name] with any of your insurance information on file for billing purposes. [name] offers a sliding scale Patient Assistance Program for patients that do not have  insurance, or whose insurance does not completely cover the cost of the ZIO monitor.  You must apply for the Patient Assistance Program to qualify for this discounted rate.  To apply, please call [name] at [phone], select option 4, select option 2, ask to apply for  Patient Assistance Program. Theodore Demark will ask your household income, and how many people  are in your household. They will quote your out-of-pocket cost based on that information.  [name] will also be able to set up a 71-month interest-free payment plan if needed.  Applying the monitor   Shave hair from upper left chest.  Hold abrader disc by orange tab. Rub abrader in 40 strokes over the upper left chest as  indicated in your monitor instructions.  Clean area with 4 enclosed alcohol pads. Let dry.  Apply patch as indicated in monitor instructions. Patch will be placed under collarbone on left  side of chest with arrow pointing upward.  Rub patch adhesive  wings for 2 minutes. Remove white label marked "1". Remove the white  label marked "2". Rub patch adhesive wings for 2 additional minutes.  While looking in a mirror, press and release button in center of patch. A small green light will  flash 3-4 times. This will be your only indicator that the monitor has been turned on.  Do not shower for the first 24 hours. You may shower after the first 24 hours.   Press the button if you feel a symptom. You will hear a small click. Record Date, Time and  Symptom in the Patient Logbook.  When you are ready to remove the patch, follow instructions on the last 2 pages of Patient  Logbook. Stick patch monitor onto the last page of Patient Logbook.  Place Patient Logbook in the blue and white box. Use locking tab on box and tape box closed  securely. The blue and white box has prepaid postage on it. Please place it in the mailbox as  soon as possible. Your physician should have your test results approximately 7 days after the  monitor has been mailed back to [facility].  Call [place] at [phone] if you have questions regarding  your [name] patch monitor. Call them immediately if you see an orange light blinking on your  monitor.  If your monitor falls off in less than 4 days, contact our Monitor department at [phone].  If your monitor becomes loose or falls off after 4 days call [name] at [phone] for  suggestions on securing your monitor   Important Information About Sugar

## 2021-07-04 DIAGNOSIS — Z22321 Carrier or suspected carrier of Methicillin susceptible Staphylococcus aureus: Secondary | ICD-10-CM | POA: Insufficient documentation

## 2021-07-11 ENCOUNTER — Ambulatory Visit (INDEPENDENT_AMBULATORY_CARE_PROVIDER_SITE_OTHER): Payer: 59

## 2021-07-11 ENCOUNTER — Other Ambulatory Visit: Payer: Self-pay | Admitting: Internal Medicine

## 2021-07-11 DIAGNOSIS — Z951 Presence of aortocoronary bypass graft: Secondary | ICD-10-CM

## 2021-07-11 DIAGNOSIS — I422 Other hypertrophic cardiomyopathy: Secondary | ICD-10-CM

## 2021-07-11 DIAGNOSIS — I2510 Atherosclerotic heart disease of native coronary artery without angina pectoris: Secondary | ICD-10-CM

## 2021-07-11 LAB — EXERCISE TOLERANCE TEST
Angina Index: 0
Duke Treadmill Score: 12
Estimated workload: 13.7
Exercise duration (min): 11 min
Exercise duration (sec): 39 s
MPHR: 170 {beats}/min
Peak HR: 145 {beats}/min
Percent HR: 85 %
RPE: 17
Rest HR: 71 {beats}/min
ST Depression (mm): 0 mm

## 2021-07-14 ENCOUNTER — Other Ambulatory Visit (HOSPITAL_BASED_OUTPATIENT_CLINIC_OR_DEPARTMENT_OTHER): Payer: Self-pay | Admitting: Family

## 2021-07-14 DIAGNOSIS — I255 Ischemic cardiomyopathy: Secondary | ICD-10-CM

## 2021-07-14 NOTE — Telephone Encounter (Signed)
Rx(s) sent to pharmacy electronically.  

## 2021-09-06 ENCOUNTER — Other Ambulatory Visit: Payer: Self-pay | Admitting: General Practice

## 2021-09-06 NOTE — Telephone Encounter (Signed)
Rx(s) sent to pharmacy electronically.  

## 2021-09-25 ENCOUNTER — Ambulatory Visit (AMBULATORY_SURGERY_CENTER): Payer: Self-pay

## 2021-09-25 VITALS — Ht 72.0 in | Wt 270.0 lb

## 2021-09-25 DIAGNOSIS — Z1211 Encounter for screening for malignant neoplasm of colon: Secondary | ICD-10-CM

## 2021-09-25 NOTE — Progress Notes (Signed)
No egg or soy allergy known to patient  No issues known to pt with past sedation with any surgeries or procedures Patient denies ever being told they had issues or difficulty with intubation  No FH of Malignant Hyperthermia Pt is not on diet pills Pt is not on  home 02  Pt is not on blood thinners  Pt denies issues with constipation  No A fib or A flutter Have any cardiac testing pending--denied Pt instructed to use Singlecare.com or GoodRx for a price reduction on prep   

## 2021-10-11 ENCOUNTER — Other Ambulatory Visit (HOSPITAL_BASED_OUTPATIENT_CLINIC_OR_DEPARTMENT_OTHER): Payer: Self-pay | Admitting: Family

## 2021-10-11 DIAGNOSIS — I255 Ischemic cardiomyopathy: Secondary | ICD-10-CM

## 2021-10-12 ENCOUNTER — Encounter: Payer: Self-pay | Admitting: Internal Medicine

## 2021-10-12 ENCOUNTER — Telehealth: Payer: Self-pay | Admitting: Internal Medicine

## 2021-10-12 NOTE — Telephone Encounter (Signed)
Werner Lean, MD  Michaelyn Barter, RN Caller: Unspecified (Today, 11:10 AM) I can do a Peer to Peer tomorrow at 10:30.  Test is to evaluate for hypertrophic cardiomyopathy     Called Dr. Laurann Montana. She stated tomorrow after 10:30 am would be too late. She stated she could do early tomorrow morning. Informed her that Dr. Gasper Sells is in clinic tomorrow morning. She stated he could call her anytime today up to 9:00 pm tonight. She stated the number listed is her cell phone number. [phone].

## 2021-10-12 NOTE — Telephone Encounter (Signed)
Pt of Dr. Gasper Sells. Please review for refill. Thank you!

## 2021-10-12 NOTE — Telephone Encounter (Signed)
Dr. Laurann Montana with [facility] is calling to request peer to peer recommendations for this patient and the [facility] monitor. Requesting call back.

## 2021-10-17 ENCOUNTER — Other Ambulatory Visit (HOSPITAL_BASED_OUTPATIENT_CLINIC_OR_DEPARTMENT_OTHER): Payer: Self-pay | Admitting: Family

## 2021-10-17 DIAGNOSIS — I255 Ischemic cardiomyopathy: Secondary | ICD-10-CM

## 2021-10-17 NOTE — Telephone Encounter (Signed)
Rx(s) sent to pharmacy electronically.  

## 2021-10-22 ENCOUNTER — Encounter: Payer: Self-pay | Admitting: Certified Registered Nurse Anesthetist

## 2021-10-23 ENCOUNTER — Telehealth: Payer: Self-pay | Admitting: Internal Medicine

## 2021-10-23 NOTE — Telephone Encounter (Signed)
noted 

## 2021-10-23 NOTE — Telephone Encounter (Signed)
Inbound call from patient stating he ate a strawberry this morning. Patient is schedule for procedure at 8:00 am on 9/20. Advised patient to follow out pre prep procedure recommendations from here on out. Please give patient a call back if needing to further advise.  Thank you

## 2021-10-25 ENCOUNTER — Ambulatory Visit (AMBULATORY_SURGERY_CENTER): Payer: 59 | Admitting: Internal Medicine

## 2021-10-25 ENCOUNTER — Encounter: Payer: Self-pay | Admitting: Internal Medicine

## 2021-10-25 VITALS — BP 99/63 | HR 64 | Temp 95.9°F | Resp 12 | Ht 72.0 in | Wt 270.0 lb

## 2021-10-25 DIAGNOSIS — Z1211 Encounter for screening for malignant neoplasm of colon: Secondary | ICD-10-CM

## 2021-10-25 DIAGNOSIS — D128 Benign neoplasm of rectum: Secondary | ICD-10-CM

## 2021-10-25 DIAGNOSIS — K6389 Other specified diseases of intestine: Secondary | ICD-10-CM

## 2021-10-25 DIAGNOSIS — K5140 Inflammatory polyps of colon without complications: Secondary | ICD-10-CM | POA: Diagnosis not present

## 2021-10-25 DIAGNOSIS — D120 Benign neoplasm of cecum: Secondary | ICD-10-CM

## 2021-10-25 DIAGNOSIS — K639 Disease of intestine, unspecified: Secondary | ICD-10-CM

## 2021-10-25 MED ORDER — SODIUM CHLORIDE 0.9 % IV SOLN: 500.0000 mL | Freq: Once | INTRAVENOUS | Status: DC

## 2021-10-25 NOTE — Progress Notes (Signed)
Called to room to assist during endoscopic procedure.  Patient ID and intended procedure confirmed with present staff. Received instructions for my participation in the procedure from the performing physician.  

## 2021-10-25 NOTE — Op Note (Signed)
[facility] Patient Name: Roberto Morrison Procedure Date: 10/25/2021 8:17 AM MRN: [id number] Endoscopist: Sonny Masters "Roberto Morrison ,  Age: 51 Referring MD:  Date of Birth: October 25, 1970 Gender: Male Account #: [id number] Procedure:                Colonoscopy Indications:              Screening for colorectal malignant neoplasm, This                            is the patient's first colonoscopy Medicines:                Monitored Anesthesia Care Procedure:                Pre-Anesthesia Assessment:                           - Prior to the procedure, a History and Physical                            was performed, and patient medications and                            allergies were reviewed. The patient's tolerance of                            previous anesthesia was also reviewed. The risks                            and benefits of the procedure and the sedation                            options and risks were discussed with the patient.                            All questions were answered, and informed consent                            was obtained. Prior Anticoagulants: The patient has                            taken no previous anticoagulant or antiplatelet                            agents. ASA Grade Assessment: III - A patient with                            severe systemic disease. After reviewing the risks                            and benefits, the patient was deemed in                            satisfactory condition to undergo the procedure.  After obtaining informed consent, the colonoscope                            was passed under direct vision. Throughout the                            procedure, the patient's blood pressure, pulse, and                            oxygen saturations were monitored continuously. The                            CF [id number] #[id number] was introduced through the anus                            and advanced to the  the terminal ileum. The                            colonoscopy was performed without difficulty. The                            patient tolerated the procedure well. The quality                            of the bowel preparation was excellent. The                            terminal ileum, ileocecal valve, appendiceal                            orifice, and rectum were photographed. Scope In: 8:27:58 AM Scope Out: 8:51:01 AM Scope Withdrawal Time: 0 hours 19 minutes 56 seconds  Total Procedure Duration: 0 hours 23 minutes 3 seconds  Findings:                 The terminal ileum appeared normal.                           Localized mild inflammation characterized by                            congestion (edema), erosions and erythema was found                            in the cecum. Biopsies were taken with a cold                            forceps for histology.                           A 5 mm polyp was found in the cecum. The polyp was                            sessile. The polyp was removed with a cold snare.  Resection and retrieval were complete.                           A few small-mouthed diverticula were found in the                            sigmoid colon and transverse colon.                           Localized mild inflammation characterized by                            congestion (edema) and erythema was found in the                            rectum and in the sigmoid colon. Biopsies were                            taken with a cold forceps for histology.                           A 10 mm polyp was found in the rectum. The polyp                            was sessile. The polyp was removed with a cold                            snare. Resection and retrieval were complete.                           Non-bleeding internal hemorrhoids were found during                            retroflexion. Complications:            No immediate  complications. Estimated Blood Loss:     Estimated blood loss was minimal. Impression:               - The examined portion of the ileum was normal.                           - Localized mild inflammation was found in the                            cecum. Biopsied.                           - One 5 mm polyp in the cecum, removed with a cold                            snare. Resected and retrieved.                           - Diverticulosis in the sigmoid colon and in the  transverse colon.                           - Localized mild inflammation was found in the                            rectum and in the sigmoid colon. Biopsied.                           - One 10 mm polyp in the rectum, removed with a                            cold snare. Resected and retrieved.                           - Non-bleeding internal hemorrhoids. Recommendation:           - Discharge patient to home (with escort).                           - Await pathology results.                           - The findings and recommendations were discussed                            with the patient. Dr Georgian Co "Roberto Leo" [name],  10/25/2021 8:57:48 AM

## 2021-10-25 NOTE — Progress Notes (Signed)
GASTROENTEROLOGY PROCEDURE H&P NOTE   Primary Care Physician: Samuel Bouche, NP    Reason for Procedure:   Colon cancer screening  Plan:    Colonoscopy  Patient is appropriate for endoscopic procedure(s) in the ambulatory ([facility]) setting.  The nature of the procedure, as well as the risks, benefits, and alternatives were carefully and thoroughly reviewed with the patient. Ample time for discussion and questions allowed. The patient understood, was satisfied, and agreed to proceed.     HPI: Roberto Morrison is a 51 y.o. male who presents for colonoscopy for colon cancer screening. Denies changes in bowel habits or weight loss. He occasional sees rectal bleeding when he is straining. Denies family history of colon cancer.  Past Medical History:  Diagnosis Date   Diabetes mellitus without complication ([place])    Hyperlipidemia    Myocardial infarction ([place]) 07/26/2020   NSTEMI 07/26/2020    Past Surgical History:  Procedure Laterality Date   CORONARY ARTERY BYPASS GRAFT N/A 07/26/2020   Procedure: CORONARY ARTERY BYPASS GRAFTING (CABG) X THREE, USING LEFT INTERNAL MAMMARY ARTERY< RIGHT INTERNAL MAMMARY ARTERY AND LEFT RADIAL ARTERY CONDUITS.;  Surgeon: Wonda Olds, MD;  Location: [facility];  Service: Open Heart Surgery;  Laterality: N/A;   INTERCOSTAL NERVE BLOCK  07/26/2020   Procedure: INTERCOSTAL NERVE BLOCK;  Surgeon: Wonda Olds, MD;  Location: [facility];  Service: Open Heart Surgery;;   LEFT HEART CATH AND CORONARY ANGIOGRAPHY N/A 07/22/2020   Procedure: LEFT HEART CATH AND CORONARY ANGIOGRAPHY;  Surgeon: Lorretta Harp, MD;  Location: [facility];  Service: Cardiovascular;  Laterality: N/A;   RADIAL ARTERY HARVEST Left 07/26/2020   Procedure: RADIAL ARTERY HARVEST;  Surgeon: Wonda Olds, MD;  Location: [facility];  Service: Open Heart Surgery;  Laterality: Left;   TEE WITHOUT CARDIOVERSION N/A 07/26/2020   Procedure: TRANSESOPHAGEAL ECHOCARDIOGRAM (TEE);  Surgeon:  Wonda Olds, MD;  Location: [facility];  Service: Open Heart Surgery;  Laterality: N/A;   TRICEPS TENDON REPAIR Left    years ago    Prior to Admission medications   Medication Sig Start Date End Date Taking? Authorizing Provider  aspirin EC 81 MG EC tablet Take 1 tablet (81 mg total) by mouth daily. Swallow whole. 07/31/20  Yes Roddenberry, Arlis Porta, PA-C  atorvastatin (LIPITOR) 80 MG tablet TAKE 1 TABLET(80 MG) BY MOUTH DAILY 10/12/21  Yes Lorretta Harp, MD  carvedilol (COREG) 6.25 MG tablet TAKE 1 TABLET(6.25 MG) BY MOUTH TWICE DAILY 09/06/21  Yes Lorretta Harp, MD  JARDIANCE 10 MG TABS tablet TAKE 1 TABLET(10 MG) BY MOUTH DAILY BEFORE BREAKFAST 10/17/21  Yes Loel Dubonnet, NP  losartan (COZAAR) 50 MG tablet TAKE 1 TABLET(50 MG) BY MOUTH DAILY 09/06/21  Yes Lorretta Harp, MD  valACYclovir (VALTREX) 500 MG tablet Take 500 mg by mouth 2 (two) times daily. 09/07/21   [provider]    Current Outpatient Medications  Medication Sig Dispense Refill   aspirin EC 81 MG EC tablet Take 1 tablet (81 mg total) by mouth daily. Swallow whole. 30 tablet 11   atorvastatin (LIPITOR) 80 MG tablet TAKE 1 TABLET(80 MG) BY MOUTH DAILY 90 tablet 2   carvedilol (COREG) 6.25 MG tablet TAKE 1 TABLET(6.25 MG) BY MOUTH TWICE DAILY 180 tablet 1   JARDIANCE 10 MG TABS tablet TAKE 1 TABLET(10 MG) BY MOUTH DAILY BEFORE BREAKFAST 90 tablet 1   losartan (COZAAR) 50 MG tablet TAKE 1 TABLET(50 MG) BY MOUTH DAILY  90 tablet 1   valACYclovir (VALTREX) 500 MG tablet Take 500 mg by mouth 2 (two) times daily.     Current Facility-Administered Medications  Medication Dose Route Frequency Provider Last Rate Last Admin   0.9 %  sodium chloride infusion  500 mL Intravenous Once Sharyn Creamer, MD        Allergies as of 10/25/2021 - Review Complete 10/25/2021  Allergen Reaction Noted   Prednisone Other (See Comments) 07/29/2017    Family History  Problem Relation Age of Onset   Healthy Mother    Healthy  Father    Colon cancer Neg Hx    Colon polyps Neg Hx    Esophageal cancer Neg Hx    Stomach cancer Neg Hx    Rectal cancer Neg Hx     Social History   Socioeconomic History   Marital status: Single    Spouse name: Not on file   Number of children: Not on file   Years of education: Not on file   Highest education level: Not on file  Occupational History   Not on file  Tobacco Use   Smoking status: Never   Smokeless tobacco: Never  Substance and Sexual Activity   Alcohol use: Not Currently   Drug use: Never   Sexual activity: Not on file  Other Topics Concern   Not on file  Social History Narrative   Not on file   Social Determinants of Health   Financial Resource Strain: Not on file  Food Insecurity: Not on file  Transportation Needs: Not on file  Physical Activity: Not on file  Stress: Not on file  Social Connections: Not on file  Intimate Partner Violence: Not on file    Physical Exam: Vital signs in last 24 hours: BP 123/72   Pulse 82   Temp (!) 95.9 F (35.5 C)   Ht 6' (1.829 m)   Wt 270 lb (122.5 kg)   SpO2 95%   BMI 36.62 kg/m  GEN: NAD EYE: Sclerae anicteric ENT: MMM CV: Non-tachycardic Pulm: No increased work of breathing GI: Soft, NT/ND NEURO:  Alert & Oriented   Christia Reading, MD [facility]  10/25/2021 8:18 AM

## 2021-10-25 NOTE — Progress Notes (Signed)
Pt's states no medical or surgical changes since previsit or office visit. 

## 2021-10-25 NOTE — Patient Instructions (Signed)
Read all of the handouts given to you by your recovery room nurse.  YOU HAD AN ENDOSCOPIC PROCEDURE TODAY AT [facility]:   Refer to the procedure report that was given to you for any specific questions about what was found during the examination.  If the procedure report does not answer your questions, please call your gastroenterologist to clarify.  If you requested that your care partner not be given the details of your procedure findings, then the procedure report has been included in a sealed envelope for you to review at your convenience later.  YOU SHOULD EXPECT: Some feelings of bloating in the abdomen. Passage of more gas than usual.  Walking can help get rid of the air that was put into your GI tract during the procedure and reduce the bloating. If you had a lower endoscopy (such as a colonoscopy or flexible sigmoidoscopy) you may notice spotting of blood in your stool or on the toilet paper. If you underwent a bowel prep for your procedure, you may not have a normal bowel movement for a few days.  Please Note:  You might notice some irritation and congestion in your nose or some drainage.  This is from the oxygen used during your procedure.  There is no need for concern and it should clear up in a day or so.  SYMPTOMS TO REPORT IMMEDIATELY:  Following lower endoscopy (colonoscopy or flexible sigmoidoscopy):  Excessive amounts of blood in the stool  Significant tenderness or worsening of abdominal pains  Swelling of the abdomen that is new, acute  Fever of 100F or higher   For urgent or emergent issues, a gastroenterologist can be reached at any hour by calling [phone]. Do not use MyChart messaging for urgent concerns.    DIET:  We do recommend a small meal at first, but then you may proceed to your regular diet.  Drink plenty of fluids but you should avoid alcoholic beverages for 24 hours. Try to increase the fiber in your diet,and drink plenty of  water.  ACTIVITY:  You should plan to take it easy for the rest of today and you should NOT DRIVE or use heavy machinery until tomorrow (because of the sedation medicines used during the test).    FOLLOW UP: Our staff will call the number listed on your records the next business day following your procedure.  We will call around 7:15- 8:00 am to check on you and address any questions or concerns that you may have regarding the information given to you following your procedure. If we do not reach you, we will leave a message.     If any biopsies were taken you will be contacted by phone or by letter within the next 1-3 weeks.  Please call us at [phone] if you have not heard about the biopsies in 3 weeks.    SIGNATURES/CONFIDENTIALITY: You and/or your care partner have signed paperwork which will be entered into your electronic medical record.  These signatures attest to the fact that that the information above on your After Visit Summary has been reviewed and is understood.  Full responsibility of the confidentiality of this discharge information lies with you and/or your care-partner.

## 2021-10-25 NOTE — Progress Notes (Signed)
Report given to PACU, vss 

## 2021-10-26 ENCOUNTER — Telehealth: Payer: Self-pay | Admitting: *Deleted

## 2021-10-26 NOTE — Telephone Encounter (Signed)
  Follow up Call-     10/25/2021    7:26 AM  Call back number  Post procedure Call Back phone  # [phone]  Permission to leave phone message Yes     Patient questions:  Do you have a fever, pain , or abdominal swelling? No. Pain Score  0 *  Have you tolerated food without any problems? Yes.    Have you been able to return to your normal activities? Yes.    Do you have any questions about your discharge instructions: Diet   No. Medications  No. Follow up visit  No.  Do you have questions or concerns about your Care? No.  Actions: * If pain score is 4 or above: No action needed, pain <4.

## 2021-10-27 ENCOUNTER — Encounter: Payer: Self-pay | Admitting: Internal Medicine

## 2021-12-15 ENCOUNTER — Ambulatory Visit: Payer: 59 | Admitting: Internal Medicine

## 2021-12-26 NOTE — Progress Notes (Deleted)
Virtual Visit via Video Note  I connected with Roberto Morrison on 12/26/21 at 11:10 AM EST by a video enabled telemedicine application and verified that I am speaking with the correct person using two identifiers.   I discussed the limitations of evaluation and management by telemedicine and the availability of in person appointments. The patient expressed understanding and agreed to proceed.  Patient location: home Provider locations: office  Subjective:    CC:   HPI:    Past medical history, Surgical history, Family history not pertinant except as noted below, Social history, Allergies, and medications have been entered into the medical record, reviewed, and corrections made.   Review of Systems: See HPI for pertinent positives and negatives.   Objective:    General: Speaking clearly in complete sentences without any shortness of breath.  Alert and oriented x3.  Normal judgment. No apparent acute distress.  Impression and Recommendations:    No problem-specific Assessment & Plan notes found for this encounter.   I discussed the assessment and treatment plan with the patient. The patient was provided an opportunity to ask questions and all were answered. The patient agreed with the plan and demonstrated an understanding of the instructions.   The patient was advised to call back or seek an in-person evaluation if the symptoms worsen or if the condition fails to improve as anticipated.  *** minutes of non-face-to-face time was provided during this encounter.  No follow-ups on file.  Clearnce Sorrel, DNP, APRN, FNP-BC [facility]

## 2021-12-27 ENCOUNTER — Encounter: Payer: Self-pay | Admitting: Internal Medicine

## 2021-12-27 ENCOUNTER — Telehealth: Payer: 59 | Admitting: Medical-Surgical

## 2021-12-27 ENCOUNTER — Ambulatory Visit: Payer: 59 | Attending: Internal Medicine | Admitting: Internal Medicine

## 2021-12-27 VITALS — BP 139/89 | HR 83 | Ht 75.0 in | Wt 255.0 lb

## 2021-12-27 DIAGNOSIS — I255 Ischemic cardiomyopathy: Secondary | ICD-10-CM | POA: Diagnosis not present

## 2021-12-27 DIAGNOSIS — I25118 Atherosclerotic heart disease of native coronary artery with other forms of angina pectoris: Secondary | ICD-10-CM | POA: Diagnosis not present

## 2021-12-27 NOTE — Patient Instructions (Signed)
Medication Instructions:  Your physician recommends that you continue on your current medications as directed. Please refer to the Current Medication list given to you today.  *If you need a refill on your cardiac medications before your next appointment, please call your pharmacy*  Lab Work: TODAY: BNP, CMP, Magnesium, CBC If you have labs (blood work) drawn today and your tests are completely normal, you will receive your results only by: [facility] (if you have MyChart) OR A paper copy in the mail If you have any lab test that is abnormal or we need to change your treatment, we will call you to review the results.  Follow-Up: At [facility], you and your health needs are our priority.  As part of our continuing mission to provide you with exceptional heart care, we have created designated Provider Care Teams.  These Care Teams include your primary Cardiologist (physician) and Advanced Practice Providers (APPs -  Physician Assistants and Nurse Practitioners) who all work together to provide you with the care you need, when you need it.  Your next appointment:   1 year(s)  The format for your next appointment:   In Person  Provider:   Rudean Haskell, MD   Important Information About Sugar

## 2021-12-27 NOTE — Progress Notes (Signed)
Cardiology Office Note:    Date:  12/27/2021   ID:  Karie Schwalbe, DOB [birth year], MRN [id number]  PCP:  Samuel Bouche, NP   [facility] Providers Cardiologist:  Quay Burow, MD     Referring MD: Samuel Bouche, NP   CC:HCM f/u  History of Present Illness:    Roberto Morrison is a 51 y.o. male with a hx CAD s/p CABG LIMA to the LAD, pedicle RIMA to the PDA and a left radial to the obtuse marginal branch. Ischemic cardiomyopathy. Thought his imaging found to have elevated LV thickening with VL dilation.  Seen for evaluation of HCM vs Athlete's Heart. 2023: unable to confirm HCM was not amenable to deconditioning.  No ectopy on subsequent testing; no family history of HCM.  Sees a "hormone guy" in [place].    Patient notes that he is doing well.   Since last visit notes that he won two more body building competitions . There are no interval hospital/ED visit.    No chest pain or pressure .  No SOB/DOE and no PND/Orthopnea.  No weight gain or leg swelling.  No palpitations or syncope .  Past Medical History:  Diagnosis Date   Diabetes mellitus without complication ([place])    Hyperlipidemia    Myocardial infarction ([place]) 07/26/2020   NSTEMI 07/26/2020    Past Surgical History:  Procedure Laterality Date   CORONARY ARTERY BYPASS GRAFT N/A 07/26/2020   Procedure: CORONARY ARTERY BYPASS GRAFTING (CABG) X THREE, USING LEFT INTERNAL MAMMARY ARTERY< RIGHT INTERNAL MAMMARY ARTERY AND LEFT RADIAL ARTERY CONDUITS.;  Surgeon: Wonda Olds, MD;  Location: [place];  Service: Open Heart Surgery;  Laterality: N/A;   INTERCOSTAL NERVE BLOCK  07/26/2020   Procedure: INTERCOSTAL NERVE BLOCK;  Surgeon: Wonda Olds, MD;  Location: [place] [facility];  Service: Open Heart Surgery;;   LEFT HEART CATH AND CORONARY ANGIOGRAPHY N/A 07/22/2020   Procedure: LEFT HEART CATH AND CORONARY ANGIOGRAPHY;  Surgeon: Lorretta Harp, MD;  Location: [place] [facility];  Service: Cardiovascular;  Laterality: N/A;   RADIAL  ARTERY HARVEST Left 07/26/2020   Procedure: RADIAL ARTERY HARVEST;  Surgeon: Wonda Olds, MD;  Location: [place];  Service: Open Heart Surgery;  Laterality: Left;   TEE WITHOUT CARDIOVERSION N/A 07/26/2020   Procedure: TRANSESOPHAGEAL ECHOCARDIOGRAM (TEE);  Surgeon: Wonda Olds, MD;  Location: [place];  Service: Open Heart Surgery;  Laterality: N/A;   TRICEPS TENDON REPAIR Left    years ago    Current Medications: Current Meds  Medication Sig   aspirin EC 81 MG EC tablet Take 1 tablet (81 mg total) by mouth daily. Swallow whole.   atorvastatin (LIPITOR) 80 MG tablet TAKE 1 TABLET(80 MG) BY MOUTH DAILY   carvedilol (COREG) 6.25 MG tablet TAKE 1 TABLET(6.25 MG) BY MOUTH TWICE DAILY   JARDIANCE 10 MG TABS tablet TAKE 1 TABLET(10 MG) BY MOUTH DAILY BEFORE BREAKFAST   losartan (COZAAR) 50 MG tablet TAKE 1 TABLET(50 MG) BY MOUTH DAILY   valACYclovir (VALTREX) 500 MG tablet Take 500 mg by mouth as needed. Per Patient only takes once a year     Allergies:   Prednisone   Social History   Socioeconomic History   Marital status: Single    Spouse name: Not on file   Number of children: Not on file   Years of education: Not on file   Highest education level: Not on file  Occupational History   Not on file  Tobacco Use  Smoking status: Never   Smokeless tobacco: Never  Substance and Sexual Activity   Alcohol use: Not Currently   Drug use: Never   Sexual activity: Not on file  Other Topics Concern   Not on file  Social History Narrative   Not on file   Social Determinants of Health   Financial Resource Strain: Not on file  Food Insecurity: Not on file  Transportation Needs: Not on file  Physical Activity: Not on file  Stress: Not on file  Social Connections: Not on file    Social: [employer] [place] in the past.  He recently won [employer] in [facility] (2023)  Family History: History of coronary artery disease notable for no members. History of heart failure notable for  no members. History of arrhythmia notable for no members. Denies family history of sudden cardiac death including drowning, car accidents, or unexplained deaths in the family. No history of bicuspid aortic valve or aortic aneurysm or dissection.   No history of cardiomyopathies including hypertrophic cardiomyopathy, left ventricular non-compaction, or arrhythmogenic right ventricular cardiomyopathy.   ROS:   Please see the history of present illness.     All other systems reviewed and are negative.  EKGs/Labs/Other Studies Reviewed:    The following studies were reviewed today:  EKG: 3/17/23SR LAFB  Cardiac Studies & Procedures   CARDIAC CATHETERIZATION  CARDIAC CATHETERIZATION 07/22/2020  Narrative Images from the original result were not included.   Prox LAD to Mid LAD lesion is 90% stenosed.  Prox Cx to Mid Cx lesion is 90% stenosed.  Mid RCA lesion is 50% stenosed.  Roberto Morrison is a 51 y.o. male   [id number] LOCATION:  FACILITY: [facility] PHYSICIAN: Quay Burow, M.D. 1970-06-27   DATE OF PROCEDURE:  07/22/2020  DATE OF DISCHARGE:     CARDIAC CATHETERIZATION    History obtained from chart review.51 y.o. male with no PMH who presented with chest pain after being at the gym and noted to have NSTEMI.  Impression Mr. Wenke was admitted with a non-STEMI.  He has three-vessel disease.  He has a long segmental mid LAD lesion and a moderately long mid AV groove circumflex lesion lesion with what appears to be a moderate though not significant distal RCA lesion.  His filling pressures were normal.  His EF looked preserved.  I believe he would be best served with coronary artery bypass grafting with bilateral IMA's.  Surgical consult has been called.  The sheath was removed and a TR band was placed on the right wrist to achieve patent hemostasis.  The patient left the lab in stable condition.  Plan will be to restart IV heparin 4 hours after sheath removal.  Quay Burow. MD, [facility] 07/22/2020 4:11 PM  Findings Coronary Findings Diagnostic  Dominance: Right  Left Anterior Descending Prox LAD to Mid LAD lesion is 90% stenosed.  Left Circumflex Prox Cx to Mid Cx lesion is 90% stenosed.  Right Coronary Artery Mid RCA lesion is 50% stenosed.  Intervention  No interventions have been documented.   STRESS TESTS  EXERCISE TOLERANCE TEST (ETT) 07/13/2021  Narrative   The ECG was negative for ischemia.   No ST deviation was noted.   Exercise capacity was excellent. Stage 4 was reached after exercising for 11 min and 39 sec. Maximum HR of 145 bpm. MPHR 85.0 %. Peak METS 13.7 .   Hypertensive blood pressure and normal heart rate response noted during stress.   ECHOCARDIOGRAM  ECHOCARDIOGRAM COMPLETE 05/01/2021  Narrative ECHOCARDIOGRAM REPORT    Patient Name:   Roberto Morrison Date of Exam: 05/01/2021 Medical Rec #:  [id number]     Height:       72.0 in Accession #:    [id number]    Weight:       284.0 lb Date of Birth:  Feb 27, 1970      BSA:          2.473 m Patient Age:    69 years      BP:           126/90 mmHg Patient Gender: M             HR:           63 bpm. Exam Location:  [place]  Procedure: 2D Echo, 3D Echo, Cardiac Doppler, Color Doppler and Strain Analysis  Indications:    I42 Asymmetric septal hypertrophy  History:        Patient has prior history of Echocardiogram examinations, most recent 10/18/2020. Previous Myocardial Infarction, Prior CABG; Risk Factors:Dyslipidemia. ASH.  Sonographer:    Basilia Jumbo BS, RDCS Referring Phys: [place]   1. Left ventricular ejection fraction, by estimation, is 50 to 55%. The left ventricle has low normal function. The left ventricle demonstrates regional wall motion abnormalities (see scoring diagram/findings for description). All basal-to-mid septal segments are hypokinetic.There is severe asymmetric left ventricular hypertrophy of the basal-septal segment.  Left ventricular diastolic parameters were normal. The average left ventricular global longitudinal strain is -20.4 %. The global longitudinal strain is normal. 2. Right ventricular systolic function is mildly reduced. The right ventricular size is normal. 3. The mitral valve is normal in structure. Mild mitral valve regurgitation. 4. The aortic valve is tricuspid. There is mild thickening of the aortic valve. Aortic valve regurgitation is mild. Aortic valve sclerosis is present, with no evidence of aortic valve stenosis. 5. Aortic dilatation noted. There is mild dilatation of the aortic root, measuring 39 mm. There is mild dilatation of the ascending aorta, measuring 37 mm.  Comparison(s): Compared to prior TTE on 10/18/20, the LVEF has improved to 50-55% (previously EF 40-45%). Otherwise, there is no significant change.  FINDINGS Left Ventricle: Left ventricular ejection fraction, by estimation, is 50 to 55%. Left ventricular ejection fraction by 3D volume is 54 %. The left ventricle has low normal function. The left ventricle demonstrates regional wall motion abnormalities. All basal-to-mid septal segments are hypokinetic. The average left ventricular global longitudinal strain is -20.4 %. The global longitudinal strain is normal. The left ventricular internal cavity size was normal in size. There is severe asymmetric left ventricular hypertrophy of the basal-septal segment. Abnormal (paradoxical) septal motion consistent with post-operative status. Left ventricular diastolic parameters were normal.  Right Ventricle: The right ventricular size is normal. No increase in right ventricular wall thickness. Right ventricular systolic function is mildly reduced.  Left Atrium: Left atrial size was normal in size.  Right Atrium: Right atrial size was normal in size.  Pericardium: There is no evidence of pericardial effusion.  Mitral Valve: The mitral valve is normal in structure. There is mild  thickening of the mitral valve leaflet(s). Mild mitral valve regurgitation.  Tricuspid Valve: The tricuspid valve is normal in structure. Tricuspid valve regurgitation is trivial.  Aortic Valve: The aortic valve is tricuspid. There is mild thickening of the aortic valve. Aortic valve regurgitation is mild. Aortic valve sclerosis is present, with no evidence of aortic valve stenosis.  Pulmonic Valve:  The pulmonic valve was normal in structure. Pulmonic valve regurgitation is trivial.  Aorta: Aortic dilatation noted. There is mild dilatation of the aortic root, measuring 39 mm. There is mild dilatation of the ascending aorta, measuring 37 mm.  IAS/Shunts: No atrial level shunt detected by color flow Doppler.   LEFT VENTRICLE PLAX 2D LVIDd:         5.90 cm         Diastology LVIDs:         4.20 cm         LV e' medial:    8.10 cm/s LV PW:         1.00 cm         LV E/e' medial:  10.2 LV IVS:        1.10 cm         LV e' lateral:   18.20 cm/s LVOT diam:     2.60 cm         LV E/e' lateral: 4.5 LV SV:         124 LV SV Index:   50              2D LVOT Area:     5.31 cm        Longitudinal Strain 2D Strain GLS  -17.2 % (A2C): 2D Strain GLS  -22.6 % (A3C): 2D Strain GLS  -21.3 % (A4C): 2D Strain GLS  -20.4 % Avg:  3D Volume EF LV 3D EF:    Left ventricul ar ejection fraction by 3D volume is 54 %.  3D Volume EF: 3D EF:        54 % LV EDV:       200 ml LV ESV:       92 ml LV SV:        108 ml  RIGHT VENTRICLE            IVC RV Basal diam:  4.20 cm    IVC diam: 1.60 cm RV S prime:     7.80 cm/s TAPSE (M-mode): 1.5 cm  LEFT ATRIUM             Index        RIGHT ATRIUM           Index LA diam:        5.30 cm 2.14 cm/m   RA Pressure: 3.00 mmHg LA Vol (A2C):   61.6 ml 24.91 ml/m  RA Area:     12.50 cm LA Vol (A4C):   38.5 ml 15.57 ml/m  RA Volume:   26.10 ml  10.55 ml/m LA Biplane Vol: 49.7 ml 20.10 ml/m AORTIC VALVE LVOT Vmax:   119.00 cm/s LVOT Vmean:  73.900  cm/s LVOT VTI:    0.233 m  AORTA Ao Root diam: 3.90 cm Ao Asc diam:  3.70 cm  MITRAL VALVE               TRICUSPID VALVE Estimated RAP:  3.00 mmHg MV Decel Time: 225 msec MV E velocity: 82.30 cm/s  SHUNTS MV A velocity: 66.50 cm/s  Systemic VTI:  0.23 m MV E/A ratio:  1.24        Systemic Diam: 2.60 cm  Gwyndolyn Kaufman MD Electronically signed by Gwyndolyn Kaufman MD Signature Date/Time: 05/01/2021/10:49:46 AM    Final   TEE  ECHO INTRAOPERATIVE TEE 07/26/2020  Narrative *INTRAOPERATIVE TRANSESOPHAGEAL REPORT *    Patient Name:   Luccas Rodell Perna  Date of Exam:  07/26/2020 Medical Rec #:  [id number]      Height:       72.0 in Accession #:    [id number]     Weight:       280.6 lb Date of Birth:  [birth year]       BSA:          2.46 m Patient Age:    32 years       BP:           135/85 mmHg Patient Gender: M              HR:           73 bpm. Exam Location:  Anesthesiology  Transesophogeal exam was perform intraoperatively during surgical procedure. Patient was closely monitored under general anesthesia during the entirety of examination.  Indications:     Coronary Artery Disease Performing Phys: [id number] WNIOEVO Z ATKINS Diagnosing Phys: Roberts Gaudy MD  Complications: No known complications during this procedure. PRE-OP FINDINGS Left Ventricle: The LV end-diastolic diameter was enlarged and measured 6.3 cm at the mid-papillary level in the trans-gastric short axis view. There was normal LV wall thicness and normal LV systolic function with the ejection fraction calculated at 55-60% using the 3DQ software. There were no regional wall motion abnormalities.  On the post-bypass view there was mild to moderate global LV dysfunction without regional wall motion abnormalities. The ejection fraction was calculated at 40-45% using the 2D Simpson's method.   Right Ventricle: The right ventricle has normal systolic function. The cavity was normal. There is no increase in right  ventricular wall thickness. The right ventricle was normal in size with normal systolic function.  On the post-bypass exam, the RV cavity was enlarged from the pre-bypass and there was mild-moderate RV systolic dysfunction.  Left Atrium: The left atrium was enlarged and measured 4.4 cm in diameter in the medial-lateral dimension. The left atrial appendage is well visualized and there is no evidence of thrombus present.  Right Atrium: Right atrial size was normal in size.  Interatrial Septum: No atrial level shunt detected by color flow Doppler. There is no evidence of a patent foramen ovale.  Pericardium: There is no evidence of pericardial effusion.  Mitral Valve: The mitral valve is normal in structure. Mitral valve regurgitation is trivial by color flow Doppler. There is No evidence of mitral stenosis.  Tricuspid Valve: The tricuspid valve was normal in structure. Tricuspid valve regurgitation is trivial by color flow Doppler. No evidence of tricuspid stenosis is present.  Aortic Valve: The aortic valve is tricuspid Aortic valve regurgitation is mild by color flow Doppler. The jet is centrally-directed. There is no stenosis of the aortic valve.   Pulmonic Valve: The pulmonic valve was normal in structure, with normal. No evidence of pulmonic stenosis. Pulmonic valve regurgitation is trivial by color flow Doppler.   Aorta: The aortic root and ascending aorta are normal in size and structure. The aortic root and proximal ascending aorta were normal in contour and diameter with normal wall thickness with no evidence of atherosclerotic disease.  The descending aorta was normal in diameter and free of atherosclerotic disease.  Aortic root: 3.4 cm Sinotubular junction: 2.5 cm Proximal ascending aorta: 3.3 cm Descending thoracic aorta: 2.8 cm.  Shunts: There is no evidence of an atrial septal defect.   Roberts Gaudy MD Electronically signed by Roberts Gaudy MD Signature Date/Time:  07/26/2020/5:18:27 PM    Final   MONITORS  LONG TERM MONITOR (3-14 DAYS)  07/15/2021  Narrative  Patient had a minimum heart rate of 30 bpm, maximum heart rate of 151 bpm, and average heart rate of 75 bpm.  Predominant underlying rhythm was sinus rhythm.  Isolated PACs were rare (<1.0%).  Isolated PVCs were rare (<1.0%).  One episode of Wenckebach heart block, asymptomatic, ~1030 AM.  Triggered and diary events associated with sinus rhythm.  No NSVT in the setting of ventricular hypertrophy.    CARDIAC MRI  MR CARDIAC MORPHOLOGY W WO CONTRAST 05/22/2021  Narrative CLINICAL DATA:  Clinical question of hypertrophic cardiomyopathy Study assumes HCT of 50 and BSA of 2.56 m2.  EXAM: CARDIAC MRI  TECHNIQUE: The patient was scanned on a 1.5 Tesla GE magnet. A dedicated cardiac coil was used. Functional imaging was done using Fiesta sequences. 2,3, and 4 chamber views were done to assess for RWMA's. Modified Simpson's rule using a short axis stack was used to calculate an ejection fraction on a dedicated work Conservation officer, nature. The patient received 10 cc of Gadavist. After 10 minutes inversion recovery sequences were used to assess for infiltration and scar tissue.  CONTRAST:  10 cc  of Gadavist  FINDINGS: 1. Mild left ventricular dilation, with LVEDD 66 mm, but LVEDVi 96 mL/m2.  Asymmetric septal hypertrophy, with intraventricular septal thickness of 17 mm, posterior wall thickness of 8 mm, and myocardial mass index of 92 g/m2, mildly elevated.  Moderately reduced left ventricular systolic function (LVEF =99%). Apical septal hypokinesis, mid septal hypokinesis, basal septal hypokinesis. There is no evidence of systolic anterior motion of the mitral valve or of LVOT Obstruction.  Left ventricular parametric mapping notable for normal native T1, T2 signal.  There is no late gadolinium enhancement in the left ventricular myocardium.  2. Normal right  ventricular size with RVEDVI 54 mL/m2.  Normal right ventricular thickness.  Normal right ventricular systolic function (RVEF =37%). There are no regional wall motion abnormalities or aneurysms.  3.  Normal left and right atrial size.  4. Normal size of the aortic root, ascending aorta and pulmonary artery.  5. Valve assessment:  Aortic Valve: Tri-leaflet. Qualitatively mild central regurgitation.  Pulmonic Valve: Qualitatively, there is no significant regurgitation.  Tricuspid Valve: Qualitatively mild central regurgitation.  Mitral Valve: Qualitatively, there is no significant regurgitation.  6.  Normal pericardium.  No pericardial effusion.  7. Grossly, no extracardiac findings. Prior median sternotomy scar noted with associated artifact. Recommended dedicated study if concerned for non-cardiac pathology.  8. Wrap around and breath hold artifacts noted. This decreased the sensitivity of volumetric evaluation of valve disease.  IMPRESSION: No evidence of infiltrative disease.  In lieu of system disease, study would be consistent with hypertrophic cardiomyopathy.  Because of wall motion related to prior ischemia, cannot use strain imaging to differentiate athlete's heart hypertrophy from pathogenic hypertrophy.  Rudean Haskell MD   Electronically Signed By: Rudean Haskell M.D. On: 05/22/2021 13:07          Recent Labs: 03/30/2021: BUN 14; Creatinine, Ser 1.30; Potassium 4.1; Sodium 140  Recent Lipid Panel    Component Value Date/Time   CHOL 113 09/12/2020 0816   TRIG 99 09/12/2020 0816   HDL 33 (L) 09/12/2020 0816   CHOLHDL 3.4 09/12/2020 0816   CHOLHDL 5.3 07/25/2020 0928   VLDL 14 07/25/2020 0928   LDLCALC 61 09/12/2020 0816        Physical Exam:    VS:  BP 139/89 Comment: Right arm 122/98  Pulse 83   Ht '6\' 3"'$  (  1.905 m)   Wt 255 lb (115.7 kg)   SpO2 97%   BMI 31.87 kg/m     Wt Readings from Last 3 Encounters:  12/27/21 255  lb (115.7 kg)  10/25/21 270 lb (122.5 kg)  09/25/21 270 lb (122.5 kg)    Gen: no distress    Neck: No JVD  Cardiac: No Rubs or Gallops, no murmur, RRR +2 Right radial pulse, L radial scar  Respiratory: Clear to auscultation bilaterally, normal effort, normal  respiratory rate GI: Soft, nontender, non-distended  MS: No  edema;  moves all extremities Integument: Skin feels warm Neuro:  At time of evaluation, alert and oriented to person/place/time/situation  Psych: Normal affect, patient feels well   ASSESSMENT:    1. Ischemic cardiomyopathy   2. 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  He has not been in support of this, we will screen him yearly looking for ectopy (either ziopatch or stress test) - we will get CMP today BNP today; given concerns for gynecomastia; would trial eplerenone if elevated  We have reviewed his CMR results (LV function) today.   We have discussed the role of genetic testing to clarify the diagnosis (would not be 100% diagnostic) and for screening of his brother. His brother has desire to get any health care.  He is not amenable to either option for testing for HCM Presently, will defer genetic testing  CAD s/p CABG (all arterial) will stop plavix and continue ASA - Mild AI next echo in 2026 - continue ASA and statin - CBC today   One year unless elevated BNP      Time Spent Directly with Patient:   I have spent a total of 40 minutes with the patient reviewing notes, imaging, EKGs, labs and examining the patient as well as establishing an assessment and plan that was discussed personally with the patient.  > 50% of time was spent in direct  patient care and reviewing imaging with patient.     Medication Adjustments/Labs and Tests Ordered: Current medicines are reviewed at length with the patient today.  Concerns regarding medicines are outlined above.  Orders Placed This Encounter  Procedures   Pro b natriuretic peptide (BNP)   CBC   Comprehensive metabolic panel   Magnesium   No orders of the defined types were placed in this encounter.   Patient Instructions  Medication Instructions:  Your physician recommends that you continue on your current medications as directed. Please refer to the Current Medication list given to you today.  *If you need a refill on your cardiac medications before your next appointment, please call your pharmacy*  Lab Work: TODAY: BNP, CMP, Magnesium, CBC If you have labs (blood work) drawn today and your tests are completely normal, you will receive your results only by: [place] (if you have MyChart) OR A paper copy in the mail If you have any lab test that is abnormal or we need to change your treatment, we will call you to review the results.  Follow-Up: At [facility], you and your health needs are our priority.  As part of our continuing mission to provide you with exceptional  heart care, we have created designated Provider Care Teams.  These Care Teams include your primary Cardiologist (physician) and Advanced Practice Providers (APPs -  Physician Assistants and Nurse Practitioners) who all work together to provide you with the care you need, when you need it.  Your next appointment:   1 year(s)  The format for your next appointment:   In Person  Provider:   Rudean Haskell, MD   Important Information About Sugar         Signed, Werner Lean, MD  12/27/2021 10:36 AM    [place]

## 2021-12-28 LAB — CBC
Hematocrit: 56.4 % — ABNORMAL HIGH (ref 37.5–51.0)
Hemoglobin: 19.1 g/dL — ABNORMAL HIGH (ref 13.0–17.7)
MCH: 33.2 pg — ABNORMAL HIGH (ref 26.6–33.0)
MCHC: 33.9 g/dL (ref 31.5–35.7)
MCV: 98 fL — ABNORMAL HIGH (ref 79–97)
Platelets: 207 10*3/uL (ref 150–450)
RBC: 5.75 x10E6/uL (ref 4.14–5.80)
RDW: 13.1 % (ref 11.6–15.4)
WBC: 6.5 10*3/uL (ref 3.4–10.8)

## 2021-12-28 LAB — COMPREHENSIVE METABOLIC PANEL
ALT: 51 IU/L — ABNORMAL HIGH (ref 0–44)
AST: 61 IU/L — ABNORMAL HIGH (ref 0–40)
Albumin/Globulin Ratio: 2 (ref 1.2–2.2)
Albumin: 4.4 g/dL (ref 4.1–5.1)
Alkaline Phosphatase: 68 IU/L (ref 44–121)
BUN/Creatinine Ratio: 17 (ref 9–20)
BUN: 24 mg/dL (ref 6–24)
Bilirubin Total: 0.7 mg/dL (ref 0.0–1.2)
CO2: 26 mmol/L (ref 20–29)
Calcium: 9.4 mg/dL (ref 8.7–10.2)
Chloride: 103 mmol/L (ref 96–106)
Creatinine, Ser: 1.42 mg/dL — ABNORMAL HIGH (ref 0.76–1.27)
Globulin, Total: 2.2 g/dL (ref 1.5–4.5)
Glucose: 58 mg/dL — ABNORMAL LOW (ref 70–99)
Potassium: 4.3 mmol/L (ref 3.5–5.2)
Sodium: 144 mmol/L (ref 134–144)
Total Protein: 6.6 g/dL (ref 6.0–8.5)
eGFR: 60 mL/min/{1.73_m2} (ref >59)

## 2021-12-28 LAB — MAGNESIUM: Magnesium: 2 mg/dL (ref 1.6–2.3)

## 2021-12-28 LAB — PRO B NATRIURETIC PEPTIDE: NT-Pro BNP: 77 pg/mL (ref 0–121)

## 2022-01-24 ENCOUNTER — Other Ambulatory Visit: Payer: Self-pay | Admitting: Cardiovascular Disease

## 2022-01-29 ENCOUNTER — Encounter (HOSPITAL_BASED_OUTPATIENT_CLINIC_OR_DEPARTMENT_OTHER): Payer: Self-pay | Admitting: Emergency Medicine

## 2022-01-29 ENCOUNTER — Emergency Department (HOSPITAL_BASED_OUTPATIENT_CLINIC_OR_DEPARTMENT_OTHER): Payer: 59

## 2022-01-29 ENCOUNTER — Emergency Department (HOSPITAL_BASED_OUTPATIENT_CLINIC_OR_DEPARTMENT_OTHER)
Admission: EM | Admit: 2022-01-29 | Discharge: 2022-01-29 | Disposition: A | Discharge status: Home / Self Care | Payer: 59 | Attending: Emergency Medicine | Admitting: Emergency Medicine

## 2022-01-29 ENCOUNTER — Other Ambulatory Visit: Payer: Self-pay

## 2022-01-29 DIAGNOSIS — I2510 Atherosclerotic heart disease of native coronary artery without angina pectoris: Secondary | ICD-10-CM | POA: Diagnosis not present

## 2022-01-29 DIAGNOSIS — R072 Precordial pain: Secondary | ICD-10-CM | POA: Insufficient documentation

## 2022-01-29 DIAGNOSIS — Z79899 Other long term (current) drug therapy: Secondary | ICD-10-CM | POA: Diagnosis not present

## 2022-01-29 DIAGNOSIS — Z7982 Long term (current) use of aspirin: Secondary | ICD-10-CM | POA: Diagnosis not present

## 2022-01-29 DIAGNOSIS — R0789 Other chest pain: Secondary | ICD-10-CM | POA: Diagnosis present

## 2022-01-29 DIAGNOSIS — Z951 Presence of aortocoronary bypass graft: Secondary | ICD-10-CM | POA: Insufficient documentation

## 2022-01-29 LAB — CBC
HCT: 50.5 % (ref 39.0–52.0)
Hemoglobin: 17.9 g/dL — ABNORMAL HIGH (ref 13.0–17.0)
MCH: 33.5 pg (ref 26.0–34.0)
MCHC: 35.4 g/dL (ref 30.0–36.0)
MCV: 94.4 fL (ref 80.0–100.0)
Platelets: 144 10*3/uL — ABNORMAL LOW (ref 150–400)
RBC: 5.35 MIL/uL (ref 4.22–5.81)
RDW: 13.9 % (ref 11.5–15.5)
WBC: 6.8 10*3/uL (ref 4.0–10.5)
nRBC: 0 % (ref 0.0–0.2)

## 2022-01-29 LAB — BASIC METABOLIC PANEL
Anion gap: 9 (ref 5–15)
BUN: 21 mg/dL — ABNORMAL HIGH (ref 6–20)
CO2: 24 mmol/L (ref 22–32)
Calcium: 9 mg/dL (ref 8.9–10.3)
Chloride: 108 mmol/L (ref 98–111)
Creatinine, Ser: 1.25 mg/dL — ABNORMAL HIGH (ref 0.61–1.24)
GFR, Estimated: >60 mL/min (ref >60)
Glucose, Bld: 151 mg/dL — ABNORMAL HIGH (ref 70–99)
Potassium: 3.6 mmol/L (ref 3.5–5.1)
Sodium: 141 mmol/L (ref 135–145)

## 2022-01-29 LAB — TROPONIN I (HIGH SENSITIVITY)
Troponin I (High Sensitivity): 12 ng/L (ref <18)
Troponin I (High Sensitivity): 11 ng/L (ref <18)

## 2022-01-29 NOTE — ED Provider Notes (Signed)
[facility] Provider Note   CSN: [id number] Arrival date & time: 01/29/22  0228     History  Chief Complaint  Patient presents with   Chest Pain    Roberto Morrison is a 51 y.o. male.  The history is provided by the patient and medical records.  Chest Pain Roberto Morrison is a 51 y.o. male who presents to the Emergency Department complaining of chest discomfort.  He presents to the emergency department for evaluation of a cramping slight discomfort in his chest that has been intermittent for the last 2 days.  Today when he went up and down the stairs he felt like his heart was beating stronger than usual.  No shortness of breath.  No prior similar symptoms.  No fever, cough, nausea, vomiting, diarrhea, leg swelling or pain.  He has a history of coronary artery disease status post CABG.      Home Medications Prior to Admission medications   Medication Sig Start Date End Date Taking? Authorizing Provider  aspirin EC 81 MG EC tablet Take 1 tablet (81 mg total) by mouth daily. Swallow whole. 07/31/20   Antony Odea, PA-C  atorvastatin (LIPITOR) 80 MG tablet TAKE 1 TABLET(80 MG) BY MOUTH DAILY 10/12/21   Lorretta Harp, MD  carvedilol (COREG) 6.25 MG tablet TAKE 1 TABLET(6.25 MG) BY MOUTH TWICE DAILY 01/24/22   Chandrasekhar, Mahesh A, MD  JARDIANCE 10 MG TABS tablet TAKE 1 TABLET(10 MG) BY MOUTH DAILY BEFORE BREAKFAST 10/17/21   Loel Dubonnet, NP  losartan (COZAAR) 50 MG tablet TAKE 1 TABLET(50 MG) BY MOUTH DAILY 09/06/21   Lorretta Harp, MD  valACYclovir (VALTREX) 500 MG tablet Take 500 mg by mouth as needed. Per Patient only takes once a year 09/07/21   [provider]      Allergies    Prednisone    Review of Systems   Review of Systems  Cardiovascular:  Positive for chest pain.  All other systems reviewed and are negative.   Physical Exam Updated Vital Signs BP (!) 143/96 (BP Location: Right Arm)   Pulse 97   Temp 97.6 F  (36.4 C) (Oral)   Resp 18   Ht 6' (1.829 m)   Wt 115.7 kg   SpO2 98%   BMI 34.58 kg/m  Physical Exam Vitals and nursing note reviewed.  Constitutional:      Appearance: He is well-developed.  HENT:     Head: Normocephalic and atraumatic.  Cardiovascular:     Rate and Rhythm: Normal rate and regular rhythm.     Heart sounds: No murmur heard. Pulmonary:     Effort: Pulmonary effort is normal. No respiratory distress.     Breath sounds: Normal breath sounds.  Musculoskeletal:        General: No swelling or tenderness.  Skin:    General: Skin is warm and dry.  Neurological:     Mental Status: He is alert and oriented to person, place, and time.  Psychiatric:        Behavior: Behavior normal.     ED Results / Procedures / Treatments   Labs (all labs ordered are listed, but only abnormal results are displayed) Labs Reviewed  BASIC METABOLIC PANEL - Abnormal; Notable for the following components:      Result Value   Glucose, Bld 151 (*)    BUN 21 (*)    Creatinine, Ser 1.25 (*)    All other components within normal limits  CBC - Abnormal; Notable for the following components:   Hemoglobin 17.9 (*)    Platelets 144 (*)    All other components within normal limits  TROPONIN I (HIGH SENSITIVITY)  TROPONIN I (HIGH SENSITIVITY)    EKG EKG Interpretation  Date/Time:  Monday January 29 2022 02:46:15 EST Ventricular Rate:  76 PR Interval:  162 QRS Duration: 118 QT Interval:  404 QTC Calculation: 161 R Axis:   196 Text Interpretation: Normal sinus rhythm Right superior axis deviation Non-specific intra-ventricular conduction delay Abnormal ECG No significant change since last tracing Confirmed by Quintella Reichert [phone]) on 01/29/2022 2:52:15 AM  Radiology DG Chest 2 View  Result Date: 01/29/2022 CLINICAL DATA:  Chest pain. EXAM: CHEST - 2 VIEW COMPARISON:  Chest radiograph dated 08/09/2020. FINDINGS: No focal consolidation, pleural effusion, pneumothorax. The cardiac  silhouette is within limits. Median sternotomy wires and CABG vascular clips. No acute osseous pathology. IMPRESSION: No active cardiopulmonary disease. Electronically Signed   By: Anner Crete M.D.   On: 01/29/2022 02:54    Procedures Procedures    Medications Ordered in ED Medications - No data to display  ED Course/ Medical Decision Making/ A&P                           Medical Decision Making Amount and/or Complexity of Data Reviewed Labs: ordered. Radiology: ordered.   Patient with history of coronary artery disease here for evaluation of a mild discomfort in his chest last 2 evenings with an increase sensation of his heart beating.  He is asymptomatic at time of ED presentation.  EKG is unchanged when compared to priors.  BMP with baseline renal function.  Troponins are negative x 2.  Presentation is atypical and unlikely for ACS.  Doubt PE or dissection.  Chest x-ray is without acute infiltrate, doubt pneumonia.  Images personally reviewed and agreed with radiologist interpretation.        Final Clinical Impression(s) / ED Diagnoses Final diagnoses:  Precordial pain    Rx / DC Orders ED Discharge Orders     None         Quintella Reichert, MD 01/29/22 [phone]

## 2022-01-29 NOTE — ED Triage Notes (Signed)
Pt state chest "feels off" states Hx of open heart surgery with triple bypass. X 2 days only at night when trying to sleep.

## 2022-01-31 ENCOUNTER — Telehealth: Payer: Self-pay | Admitting: General Practice

## 2022-01-31 NOTE — Telephone Encounter (Signed)
Transition Care Management Unsuccessful Follow-up Telephone Call  Date of discharge and from where:  01/29/22 [facility]  Attempts:  1st Attempt  Reason for unsuccessful TCM follow-up call:  Left voice message

## 2022-02-01 NOTE — Telephone Encounter (Signed)
Transition Care Management Unsuccessful Follow-up Telephone Call  Date of discharge and from where:  01/29/22 from [facility]  Attempts:  2nd Attempt  Reason for unsuccessful TCM follow-up call:  Left voice message

## 2022-02-06 NOTE — Telephone Encounter (Signed)
Transition Care Management Unsuccessful Follow-up Telephone Call  Date of discharge and from where:  01/29/22 from [facility]  Attempts:  3rd Attempt  Reason for unsuccessful TCM follow-up call:  Left voice message

## 2022-04-04 ENCOUNTER — Institutional Professional Consult (permissible substitution): Payer: 59 | Admitting: Plastic Surgery

## 2022-04-08 ENCOUNTER — Other Ambulatory Visit: Payer: Self-pay | Admitting: Cardiovascular Disease

## 2022-04-30 ENCOUNTER — Telehealth: Payer: Self-pay | Admitting: Medical-Surgical

## 2022-04-30 NOTE — Telephone Encounter (Signed)
He will need an appointment.  We do not have any record on file or in care everywhere of an elevated PSA.  In order to refer, we have to have documentation of results and any particular symptoms he may be having.

## 2022-04-30 NOTE — Telephone Encounter (Signed)
Received incoming voicemail from patient requesting referral for prostate exam due to PSA being elevated. Can you please place Urology referral? Please advise.

## 2022-05-02 NOTE — Telephone Encounter (Signed)
Spoke with patient and relayed information, patient states to disregard because he has an appointment on 06/05/2022 with Dr. Burman Nieves  with [facility].

## 2022-06-06 IMAGING — DX DG CHEST 1V PORT
1 series · 1 of 1 positions shown · non-contrast
Comparison: Chest x-ray 07/21/2020.

CLINICAL DATA: CABG.

EXAM:
PORTABLE CHEST 1 VIEW

[chest]
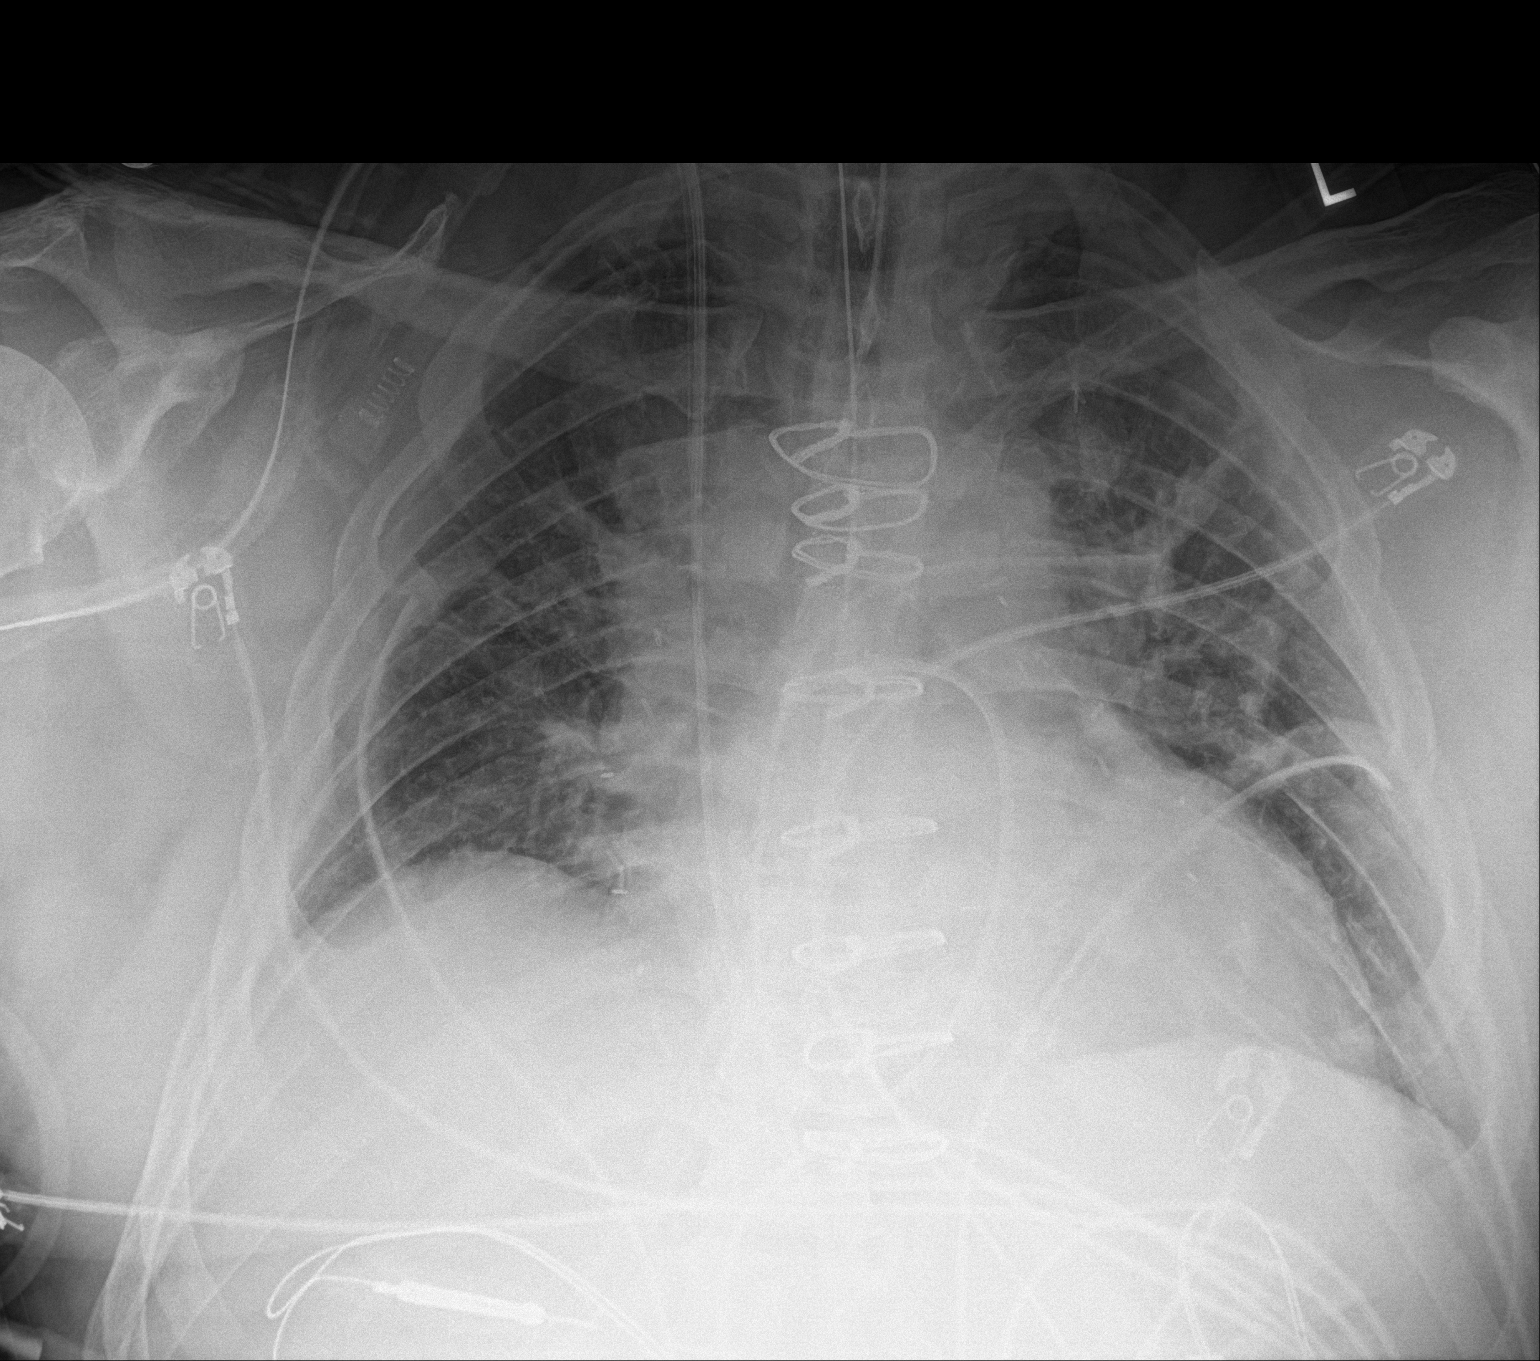

[1 of 1 positions shown; findings below may reference images not displayed]

FINDINGS: Endotracheal tube, NG tube, Swan-Ganz catheter, bilateral chest
tubes in good anatomic position. Prior CABG. Cardiomegaly. Low lung
volumes. Bilateral pulmonary interstitial prominence consistent with
interstitial edema. No scratched it tiny bilateral pleural effusions
cannot be excluded. No pneumothorax.
IMPRESSION: 1. Endotracheal tube, NG tube, Swan-Ganz catheter, bilateral chest
tubes in good anatomic position. No pneumothorax.

2. Prior CABG. Cardiomegaly. Bilateral interstitial prominence
consistent with interstitial edema. Tiny bilateral pleural effusions
cannot be excluded. No pneumothorax.

## 2022-06-08 IMAGING — DX DG CHEST 1V PORT
1 series · 1 of 1 positions shown · non-contrast
Comparison: 07/27/2020.

CLINICAL DATA: Chest tube.  Open-heart surgery.

EXAM:
PORTABLE CHEST 1 VIEW

[chest ap]
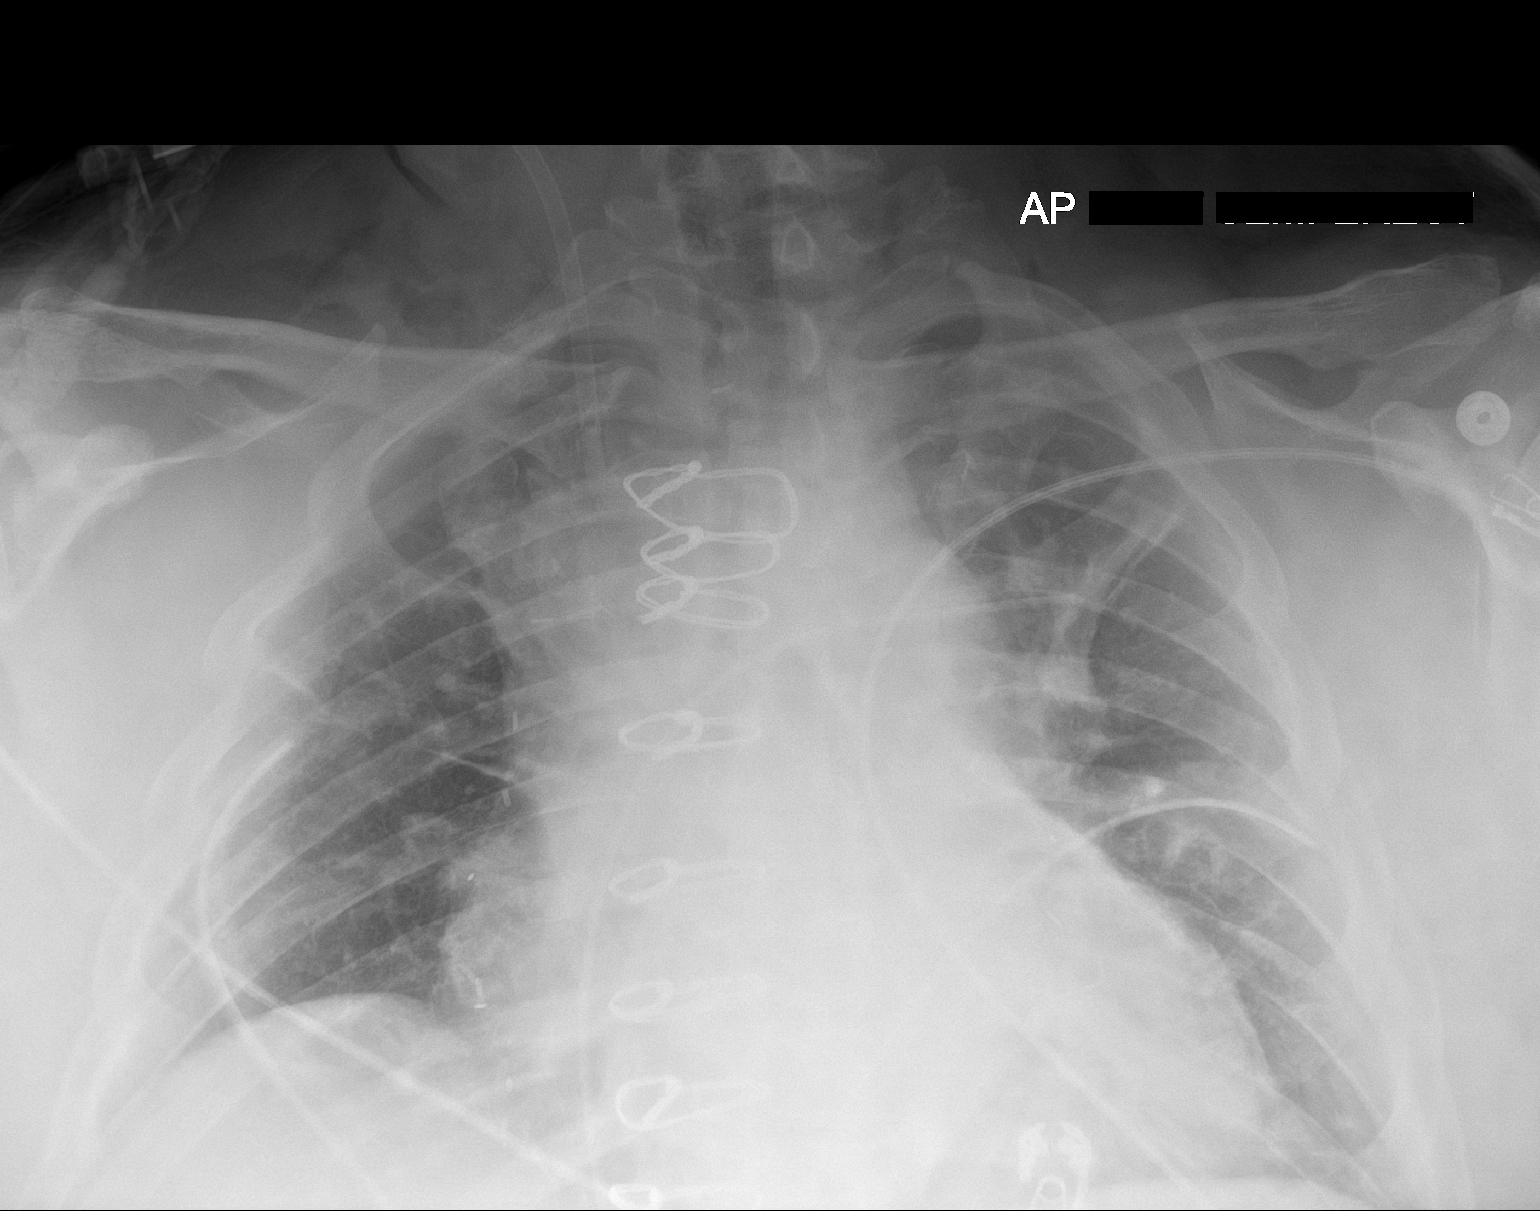

[1 of 1 positions shown; findings below may reference images not displayed]

FINDINGS: Right IJ sheath and bilateral chest tubes in stable position. No
pneumothorax. Prior CABG. Stable cardiomegaly. Low lung volumes with
bilateral subsegmental atelectasis again noted. Mild left base
infiltrate cannot be excluded.
IMPRESSION: 1. Right IJ sheath and bilateral chest tubes in stable position. No
pneumothorax.

2.  Prior CABG.  Stable cardiomegaly.

3. Bilateral subsegmental atelectasis again noted. Mild left base
infiltrate cannot be excluded on today's exam.

## 2022-06-13 IMAGING — DX DG CHEST 2V
2 series · 2 of 2 positions shown · non-contrast
Comparison: 07/29/2020

CLINICAL DATA: Status post CABG

EXAM:
CHEST - 2 VIEW

[dg chest 2 view (1 of 2)]
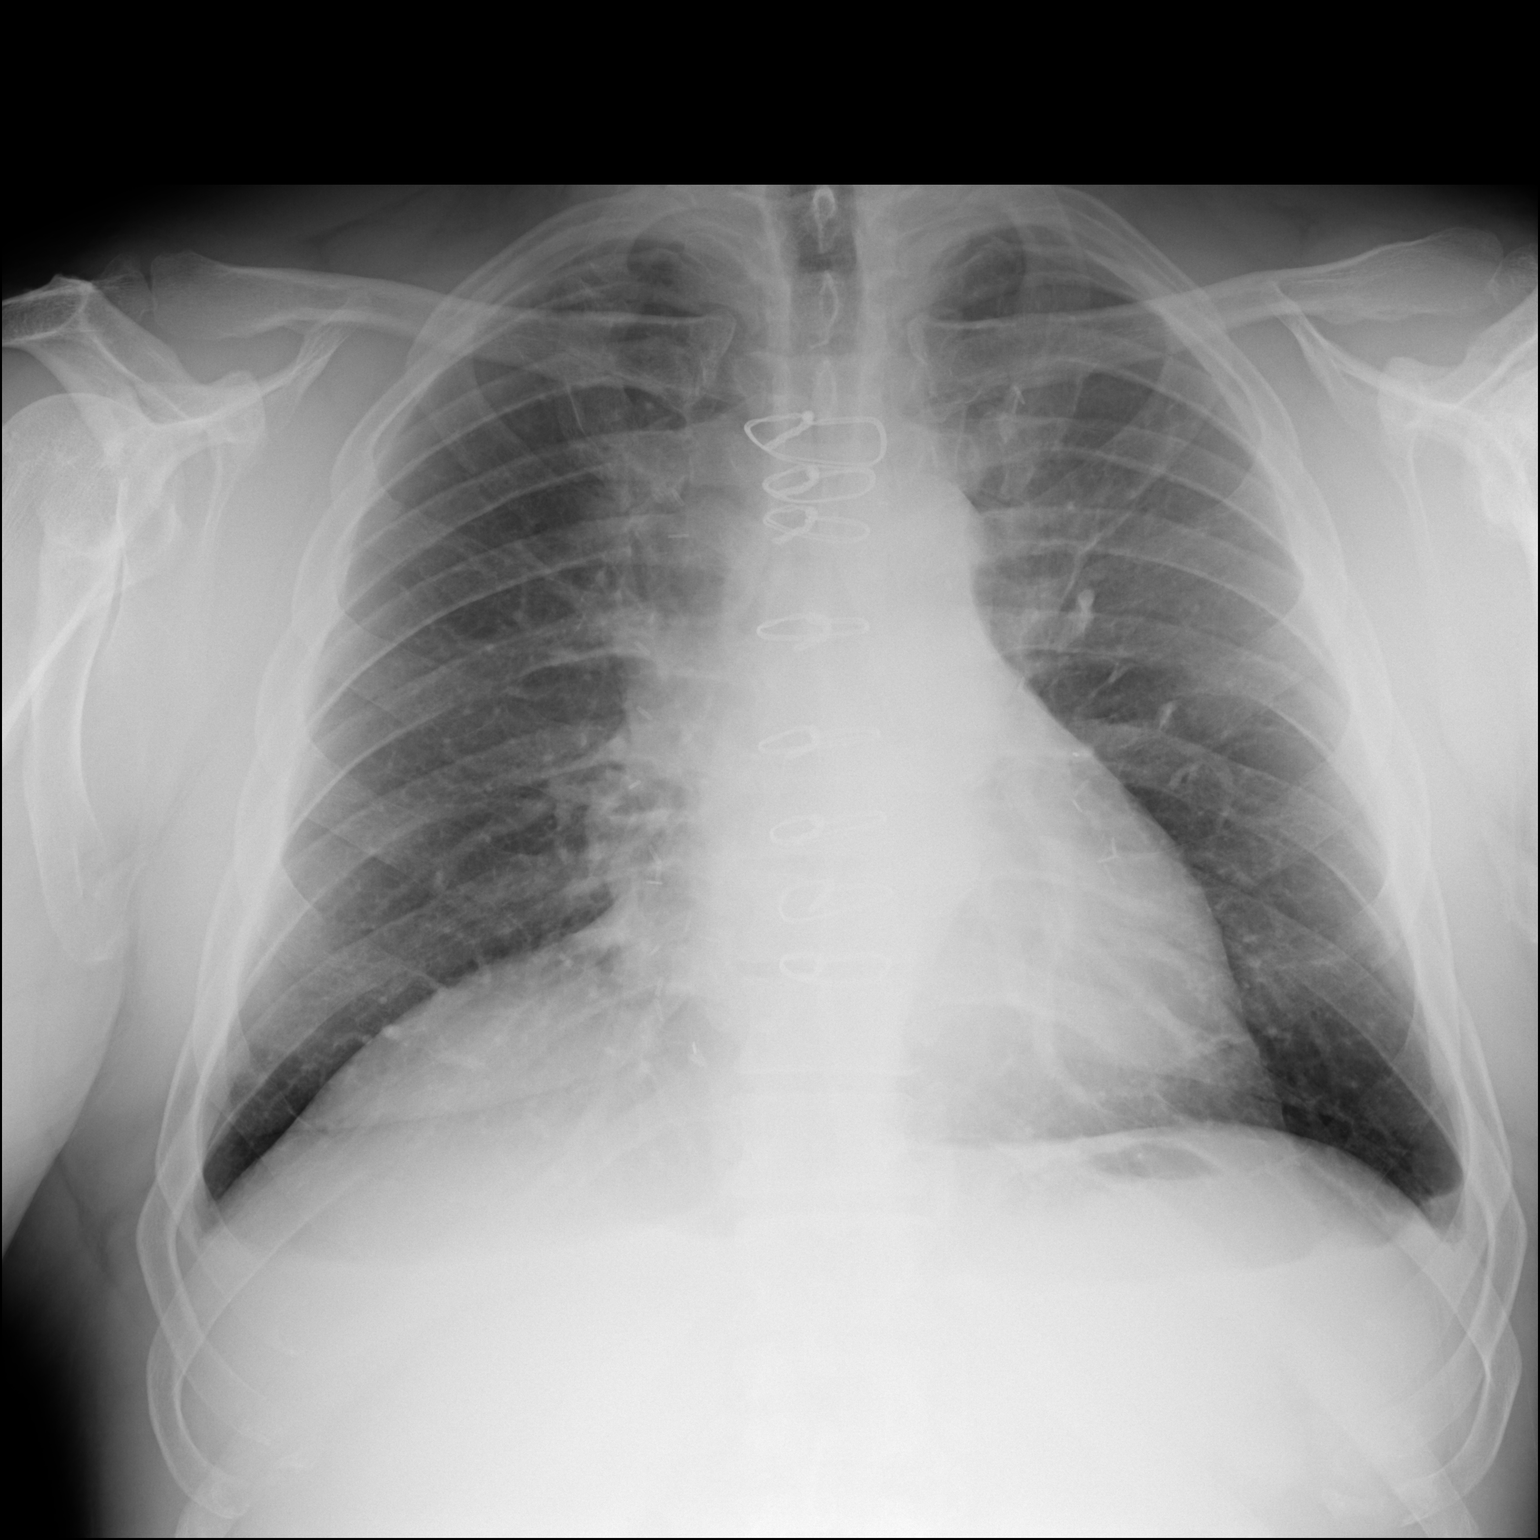

[dg chest 2 view (2 of 2)]
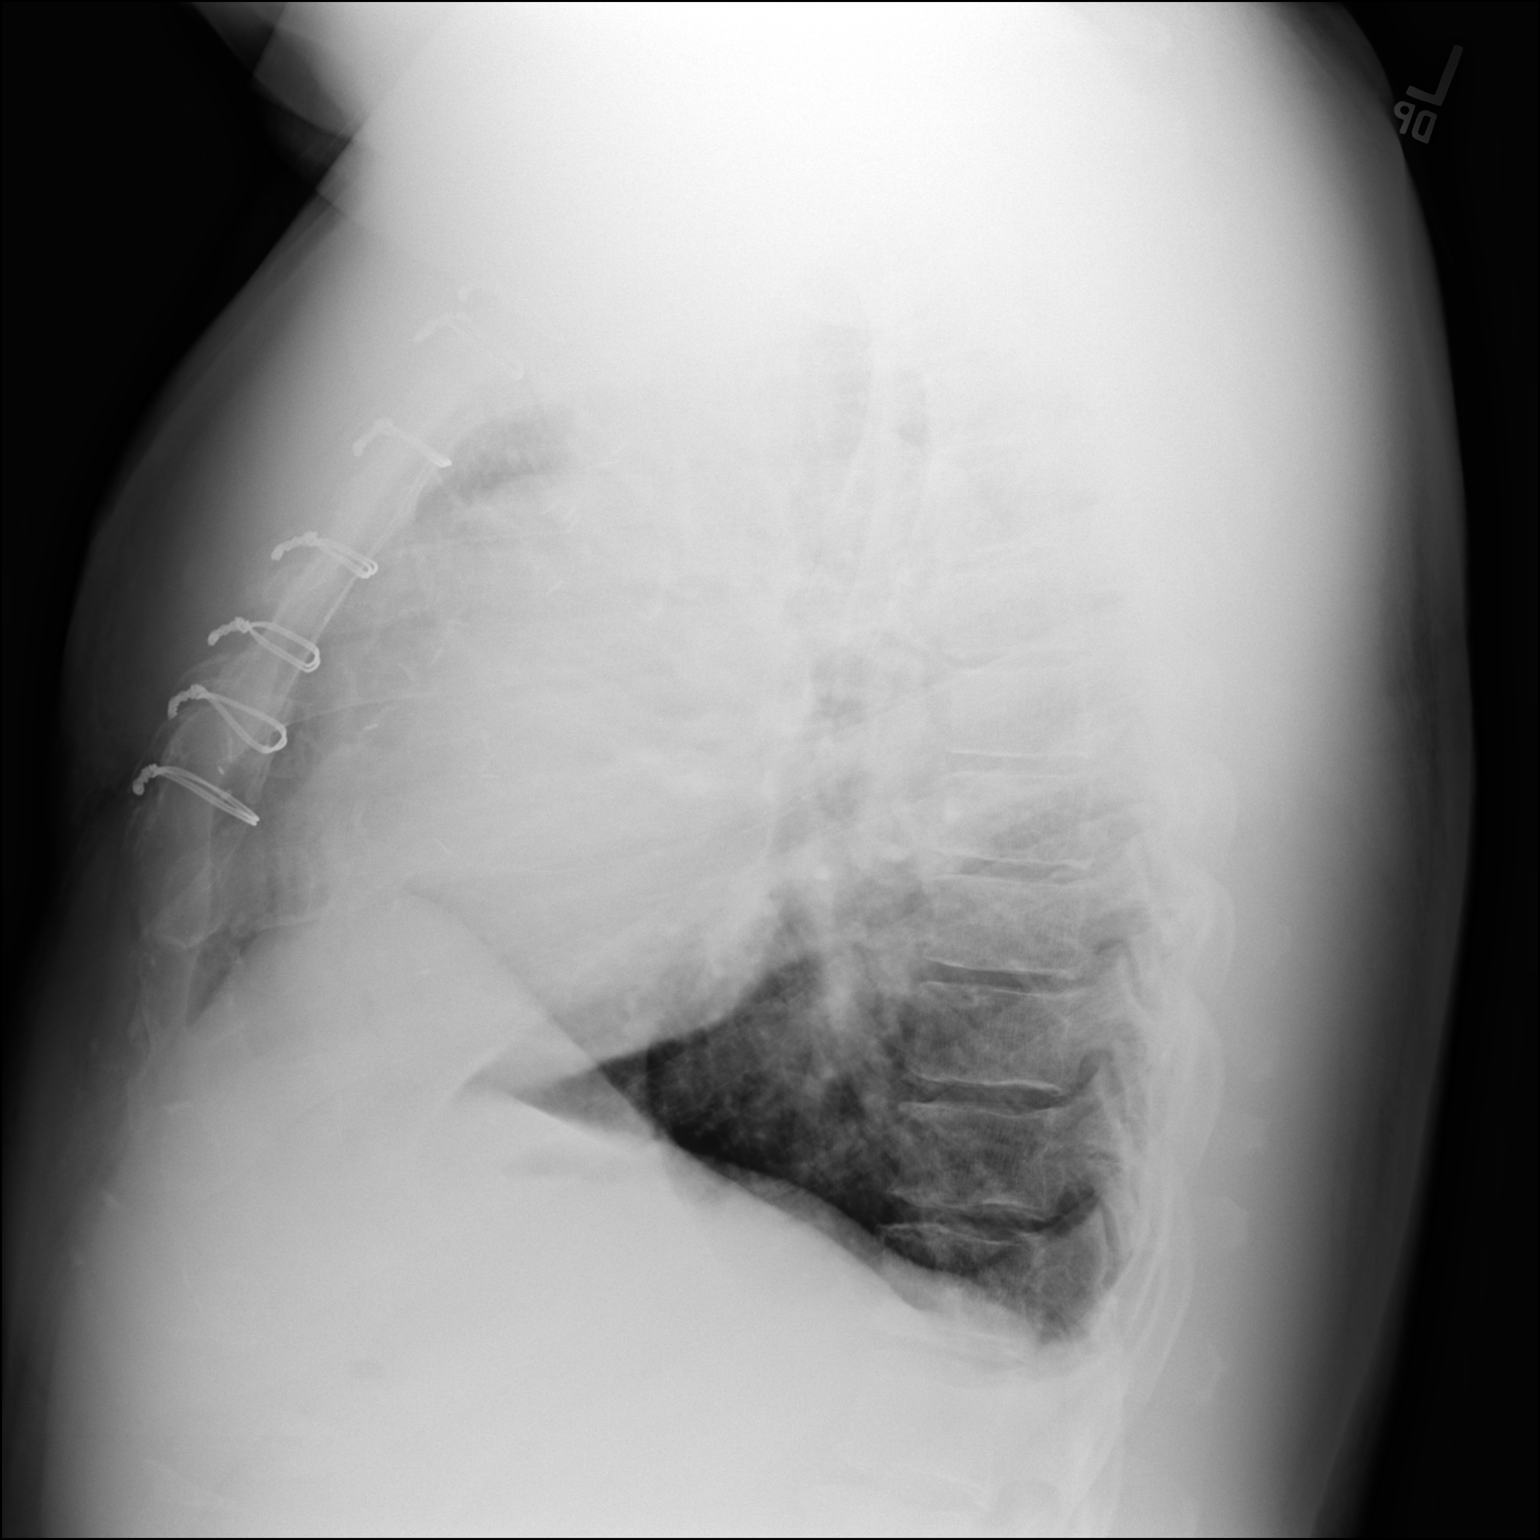

[2 of 2 positions shown; findings below may reference images not displayed]

FINDINGS: Lungs are clear. Trace bilateral pleural effusions. No pneumothorax.

The heart is normal in size. Postsurgical changes related to prior
CABG.

Median sternotomy.

Visualized osseous structures are within normal limits.
IMPRESSION: Trace bilateral pleural effusions.  No pneumothorax.

## 2022-06-19 ENCOUNTER — Encounter: Payer: 59 | Admitting: Medical-Surgical

## 2022-06-20 IMAGING — DX DG CHEST 2V
2 series · 2 of 2 positions shown · non-contrast
Comparison: August 02, 2020.

CLINICAL DATA: Status post coronary bypass graft.

EXAM:
CHEST - 2 VIEW

[dg chest 2 view (1 of 2)]
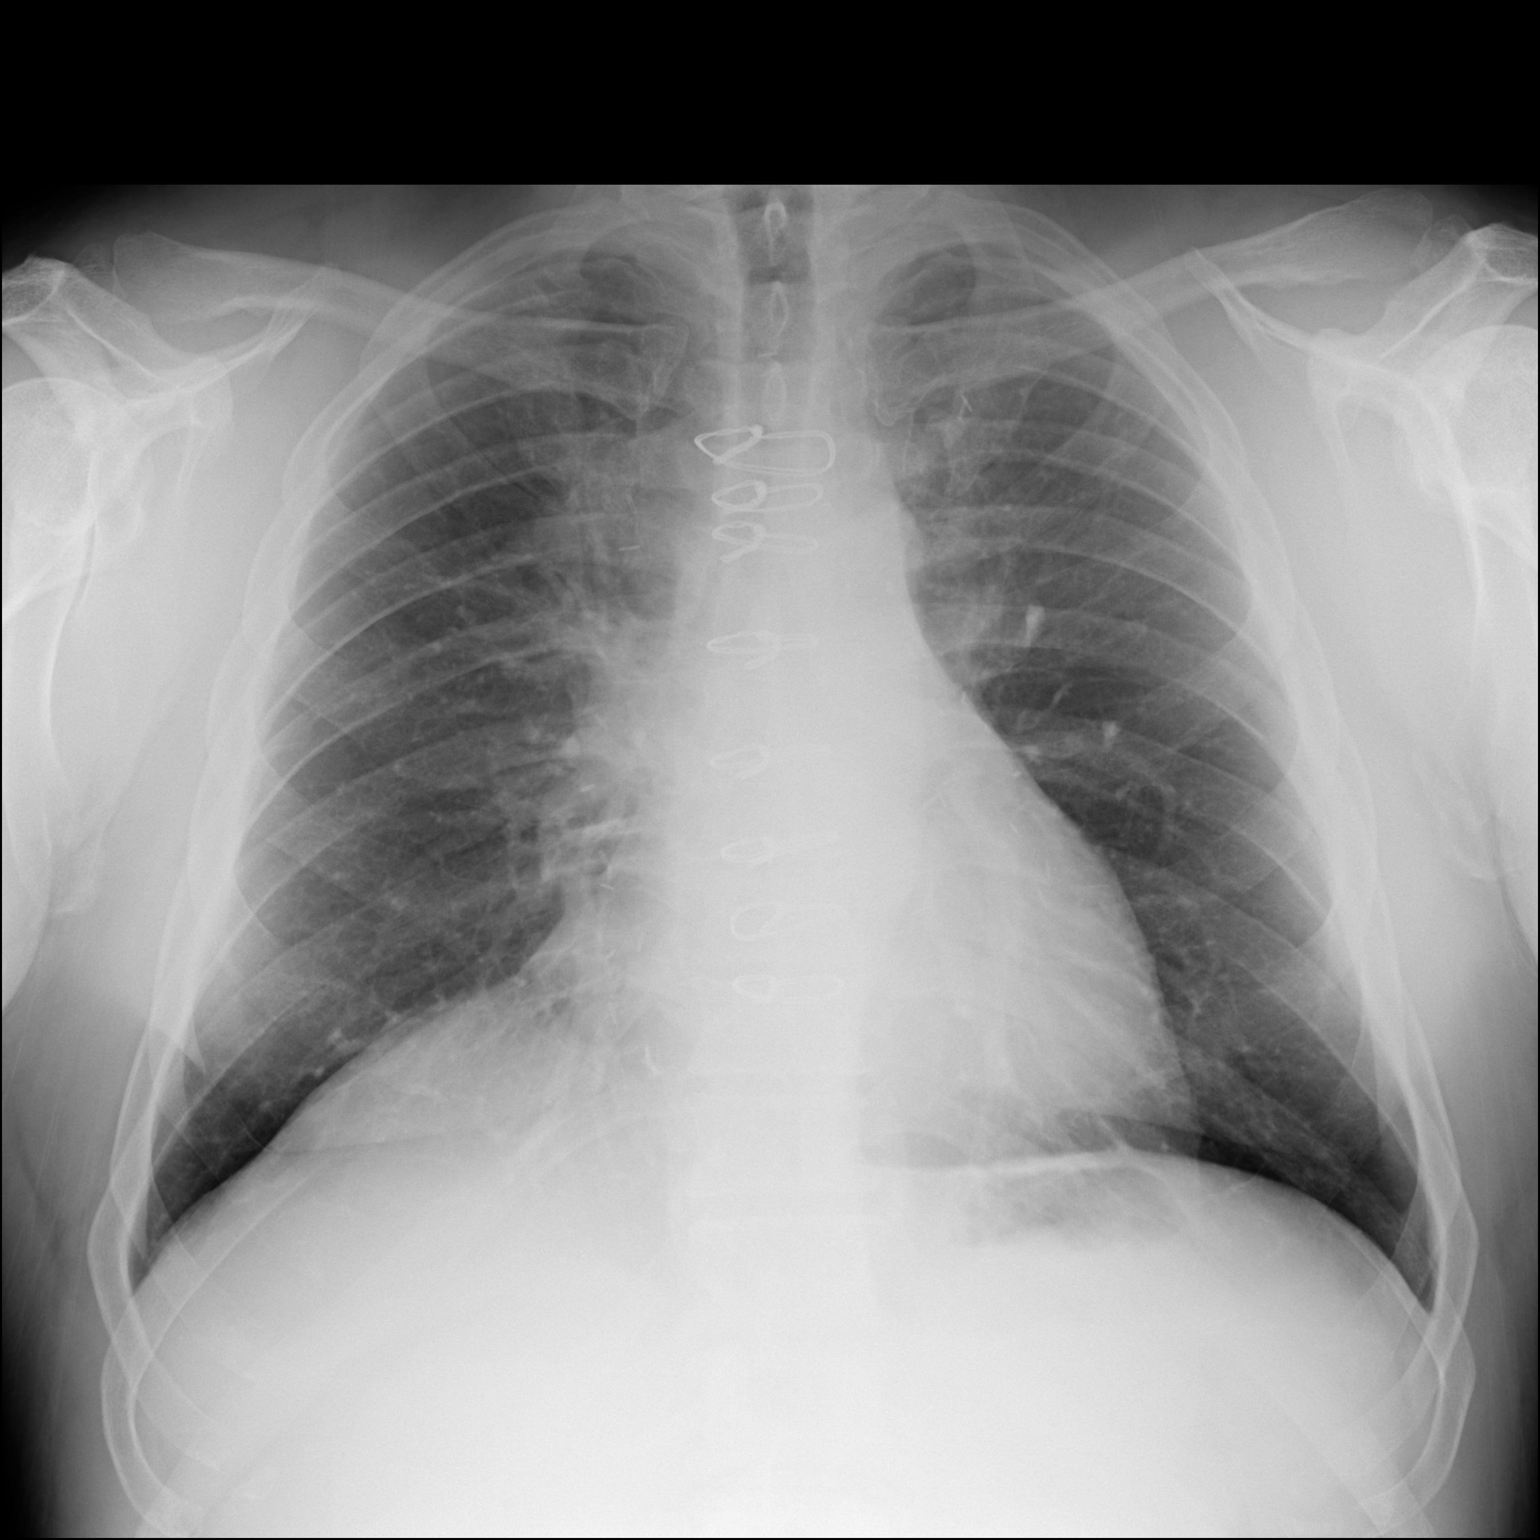

[dg chest 2 view (2 of 2)]
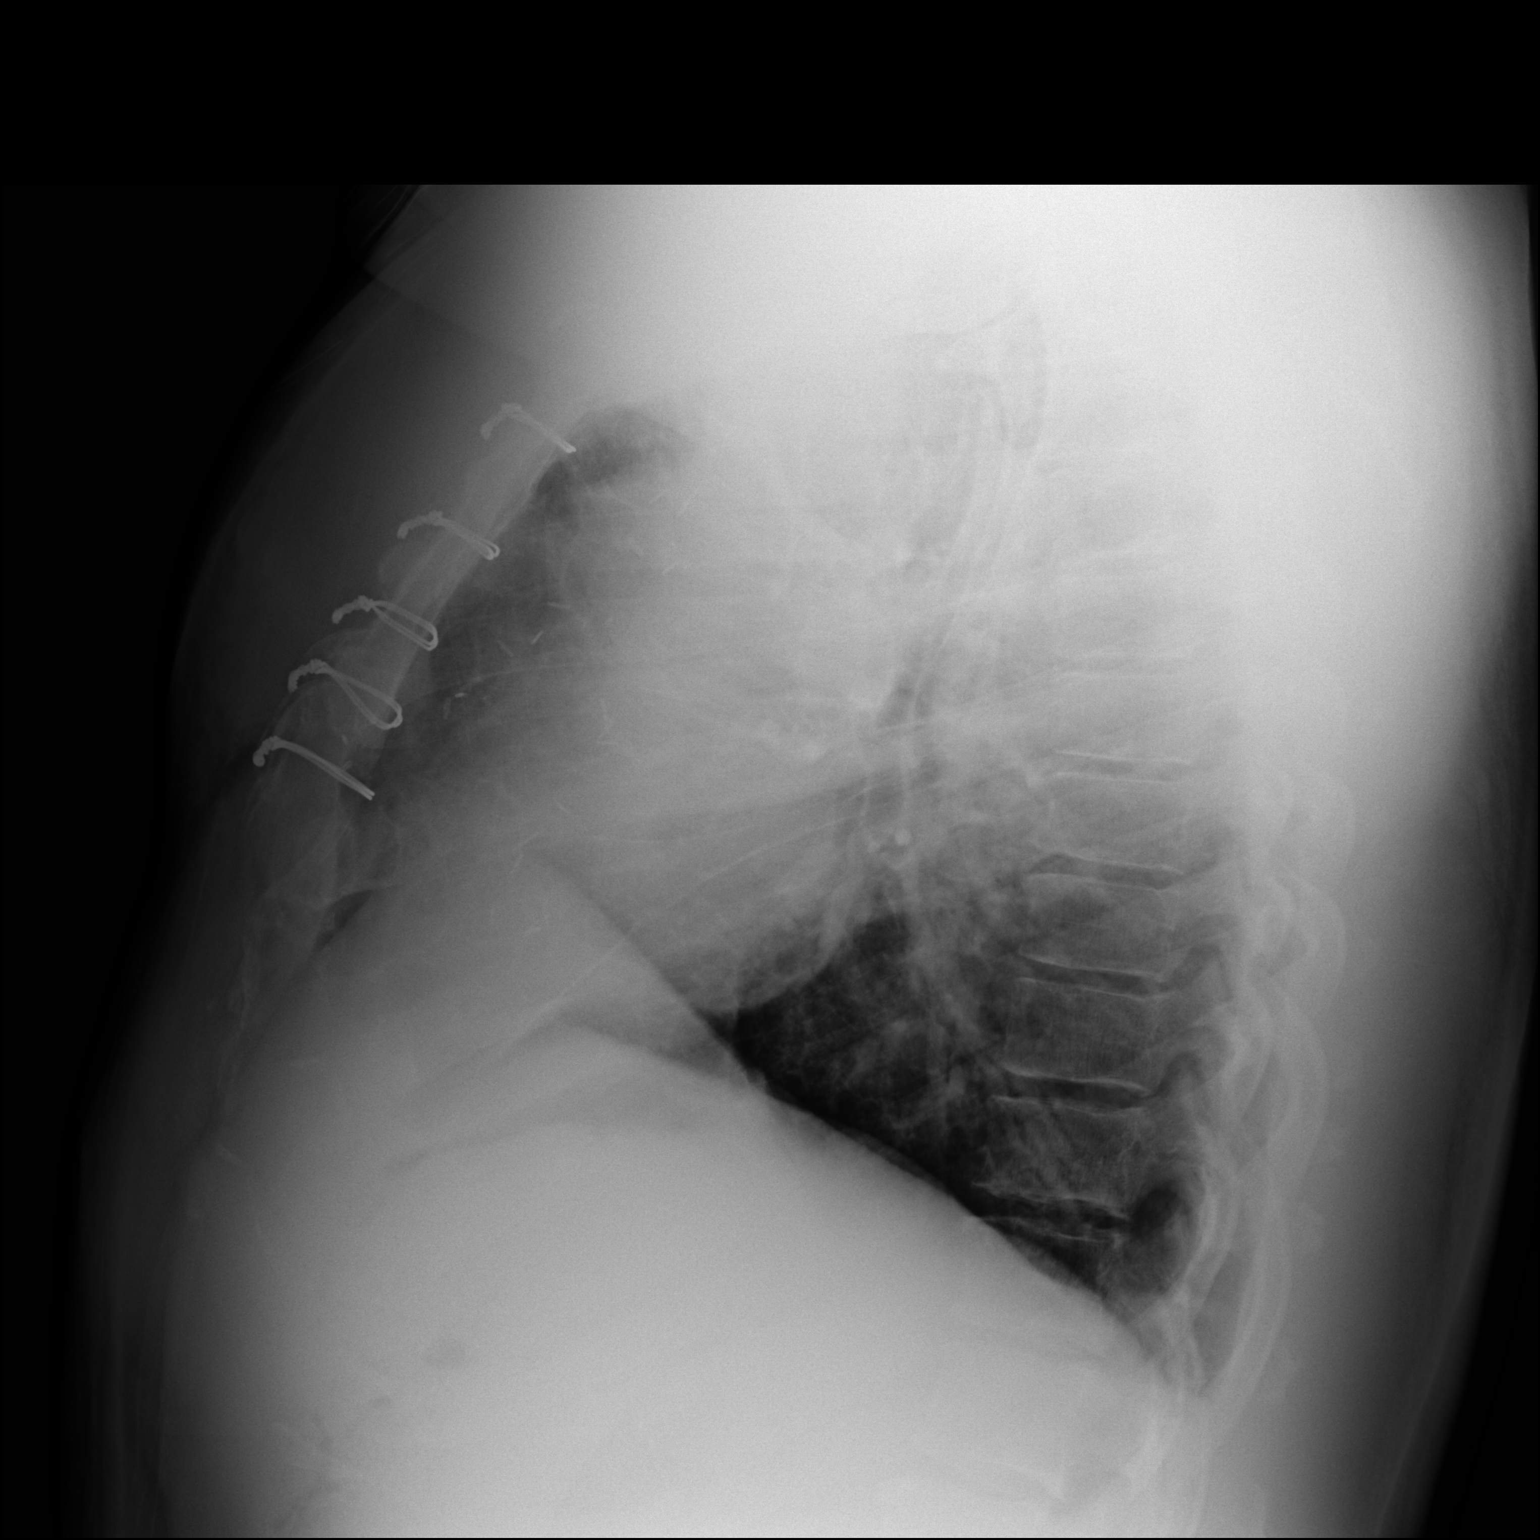

[2 of 2 positions shown; findings below may reference images not displayed]

FINDINGS: The heart size and mediastinal contours are within normal limits.
Both lungs are clear. Sternotomy wires are noted. The visualized
skeletal structures are unremarkable.
IMPRESSION: No active cardiopulmonary disease.

## 2022-06-25 ENCOUNTER — Encounter: Payer: Self-pay | Admitting: Medical-Surgical

## 2022-06-25 ENCOUNTER — Ambulatory Visit (INDEPENDENT_AMBULATORY_CARE_PROVIDER_SITE_OTHER): Payer: 59 | Admitting: Medical-Surgical

## 2022-06-25 VITALS — BP 125/77 | HR 69 | Resp 20 | Ht 72.0 in | Wt 269.1 lb

## 2022-06-25 DIAGNOSIS — E782 Mixed hyperlipidemia: Secondary | ICD-10-CM | POA: Diagnosis not present

## 2022-06-25 DIAGNOSIS — G479 Sleep disorder, unspecified: Secondary | ICD-10-CM

## 2022-06-25 DIAGNOSIS — Z0000 Encounter for general adult medical examination without abnormal findings: Secondary | ICD-10-CM

## 2022-06-25 MED ORDER — TRAZODONE HCL 50 MG PO TABS: 50.0000 mg | ORAL_TABLET | Freq: Every evening | ORAL | 3 refills | Status: DC | PRN

## 2022-06-25 NOTE — Progress Notes (Signed)
Complete physical exam  Patient: Roberto Morrison   DOB: 10/12/1970   52 y.o. Male  MRN: [id number]  Subjective:    Chief Complaint  Patient presents with   Annual Exam   Roberto Morrison is a 52 y.o. male who presents today for a complete physical exam. He reports consuming a general diet.  Exercises regularly and competes in bodybuilding events.  He generally feels well. He reports sleeping fairly well. He does not have additional problems to discuss today.    Most recent fall risk assessment:    06/25/2022    2:29 PM  Fall Risk   Falls in the past year? 0  Number falls in past yr: 0  Injury with Fall? 0  Risk for fall due to : No Fall Risks  Follow up Falls evaluation completed     Most recent depression screenings:    06/25/2022    2:30 PM 06/16/2021    2:50 PM  PHQ 2/9 Scores  PHQ - 2 Score 0 0  PHQ- 9 Score  0    Vision:Not within last year , Dental: No current dental problems and Receives regular dental care, STD: The patient denies history of sexually transmitted disease., and PSA: Declines PSA testing at this time     Patient Care Team: Christen Butter, NP as PCP - General (Nurse Practitioner) Runell Gess, MD as PCP - Cardiology (Cardiology)   Outpatient Medications Prior to Visit  Medication Sig   aspirin EC 81 MG EC tablet Take 1 tablet (81 mg total) by mouth daily. Swallow whole.   atorvastatin (LIPITOR) 80 MG tablet TAKE 1 TABLET(80 MG) BY MOUTH DAILY   carvedilol (COREG) 6.25 MG tablet TAKE 1 TABLET(6.25 MG) BY MOUTH TWICE DAILY   JARDIANCE 10 MG TABS tablet TAKE 1 TABLET(10 MG) BY MOUTH DAILY BEFORE BREAKFAST   losartan (COZAAR) 50 MG tablet TAKE 1 TABLET(50 MG) BY MOUTH DAILY   valACYclovir (VALTREX) 500 MG tablet Take 500 mg by mouth as needed. Per Patient only takes once a year   No facility-administered medications prior to visit.   Review of Systems  Constitutional:  Negative for chills, fever, malaise/fatigue and weight loss.  HENT:  Negative for  congestion, ear pain, hearing loss, sinus pain and sore throat.   Eyes:  Negative for blurred vision, photophobia and pain.  Respiratory:  Negative for cough, shortness of breath and wheezing.   Cardiovascular:  Negative for chest pain, palpitations and leg swelling.  Gastrointestinal:  Negative for abdominal pain, constipation, diarrhea, heartburn, nausea and vomiting.  Genitourinary:  Negative for dysuria, frequency and urgency.  Musculoskeletal:  Negative for falls and neck pain.  Skin:  Negative for itching and rash.  Neurological:  Negative for dizziness, weakness and headaches.  Endo/Heme/Allergies:  Negative for polydipsia. Does not bruise/bleed easily.  Psychiatric/Behavioral:  Negative for depression, substance abuse and suicidal ideas. The patient has insomnia. The patient is not nervous/anxious.      Objective:    BP 125/77 (BP Location: Right Arm, Cuff Size: Large)   Pulse 69   Resp 20   Ht 6' (1.829 m)   Wt 269 lb 1.3 oz (122.1 kg)   SpO2 97%   BMI 36.49 kg/m    Physical Exam Vitals reviewed.  Constitutional:      General: He is not in acute distress.    Appearance: Normal appearance. He is not ill-appearing.  HENT:     Head: Normocephalic and atraumatic.     Right  Ear: Tympanic membrane, ear canal and external ear normal. There is no impacted cerumen.     Left Ear: Tympanic membrane, ear canal and external ear normal. There is no impacted cerumen.     Nose: Nose normal. No congestion or rhinorrhea.     Mouth/Throat:     Mouth: Mucous membranes are moist.     Pharynx: No oropharyngeal exudate or posterior oropharyngeal erythema.  Eyes:     General: No scleral icterus.       Right eye: No discharge.        Left eye: No discharge.     Extraocular Movements: Extraocular movements intact.     Conjunctiva/sclera: Conjunctivae normal.     Pupils: Pupils are equal, round, and reactive to light.  Neck:     Thyroid: No thyromegaly.     Vascular: No carotid bruit or  JVD.     Trachea: Trachea normal.  Cardiovascular:     Rate and Rhythm: Normal rate and regular rhythm.     Pulses: Normal pulses.     Heart sounds: Normal heart sounds. No murmur heard.    No friction rub. No gallop.  Pulmonary:     Effort: Pulmonary effort is normal. No respiratory distress.     Breath sounds: Normal breath sounds. No wheezing.  Abdominal:     General: Bowel sounds are normal. There is no distension.     Palpations: Abdomen is soft.     Tenderness: There is no abdominal tenderness. There is no guarding.  Musculoskeletal:        General: Normal range of motion.     Cervical back: Normal range of motion and neck supple.  Lymphadenopathy:     Cervical: No cervical adenopathy.  Skin:    General: Skin is warm and dry.  Neurological:     Mental Status: He is alert and oriented to person, place, and time.     Cranial Nerves: No cranial nerve deficit.  Psychiatric:        Mood and Affect: Mood normal.        Behavior: Behavior normal.        Thought Content: Thought content normal.        Judgment: Judgment normal.    No results found for any visits on 06/25/22.     Assesment & Plan:    Routine Health Maintenance and Physical Exam  Immunization History  Administered Date(s) Administered   Influenza Split 01/28/2012    Health Maintenance  Topic Date Due   DTaP/Tdap/Td (1 - Tdap) Never done   COVID-19 Vaccine (1) 07/11/2022 (Originally 02/11/1976)   Zoster Vaccines- Shingrix (1 of 2) 09/25/2022 (Originally 02/10/1990)   Hepatitis C Screening  06/25/2023 (Originally 02/10/1989)   INFLUENZA VACCINE  09/06/2022   COLONOSCOPY (Pts 45-43yrs Insurance coverage will need to be confirmed)  10/26/2026   HIV Screening  Completed   HPV VACCINES  Aged Out    Discussed health benefits of physical activity, and encouraged him to engage in regular exercise appropriate for his age and condition.  1. Annual physical exam Recommend updating vision care.  Checking labs as  below.  Wellness information provided with AVS. - CBC with Differential/Platelet - COMPLETE METABOLIC PANEL WITH GFR - Lipid panel  2. Mixed hyperlipidemia Checking labs as below.  Currently on atorvastatin 80 mg daily, tolerating well. - COMPLETE METABOLIC PANEL WITH GFR - Lipid panel  3. Trouble in sleeping History of trouble with sleeping.  Occasionally tries melatonin which is helpful intermittently.  Has tried Advil PM which helps on the first night but the second night keeps him awake.  Adding trazodone 50-100 mg nightly as needed.  He will let me know if this works well for him or if he would like to try something different.  Return in about 1 year (around 06/25/2023) for annual physical exam.     Christen Butter, NP

## 2022-06-26 LAB — CBC WITH DIFFERENTIAL/PLATELET
Absolute Monocytes: 618 cells/uL (ref 200–950)
Basophils Absolute: 59 cells/uL (ref 0–200)
Basophils Relative: 0.9 %
Eosinophils Absolute: 189 cells/uL (ref 15–500)
Eosinophils Relative: 2.9 %
HCT: 52.4 % — ABNORMAL HIGH (ref 38.5–50.0)
Hemoglobin: 18 g/dL — ABNORMAL HIGH (ref 13.2–17.1)
Lymphs Abs: 1918 cells/uL (ref 850–3900)
MCH: 32.4 pg (ref 27.0–33.0)
MCHC: 34.4 g/dL (ref 32.0–36.0)
MCV: 94.2 fL (ref 80.0–100.0)
MPV: 11 fL (ref 7.5–12.5)
Monocytes Relative: 9.5 %
Neutro Abs: 3718 cells/uL (ref 1500–7800)
Neutrophils Relative %: 57.2 %
Platelets: 169 10*3/uL (ref 140–400)
RBC: 5.56 10*6/uL (ref 4.20–5.80)
RDW: 12.8 % (ref 11.0–15.0)
Total Lymphocyte: 29.5 %
WBC: 6.5 10*3/uL (ref 3.8–10.8)

## 2022-06-26 LAB — COMPLETE METABOLIC PANEL WITH GFR
AG Ratio: 1.9 (calc) (ref 1.0–2.5)
ALT: 53 U/L — ABNORMAL HIGH (ref 9–46)
AST: 42 U/L — ABNORMAL HIGH (ref 10–35)
Albumin: 4.2 g/dL (ref 3.6–5.1)
Alkaline phosphatase (APISO): 55 U/L (ref 35–144)
BUN/Creatinine Ratio: 20 (calc) (ref 6–22)
BUN: 26 mg/dL — ABNORMAL HIGH (ref 7–25)
CO2: 26 mmol/L (ref 20–32)
Calcium: 9.5 mg/dL (ref 8.6–10.3)
Chloride: 105 mmol/L (ref 98–110)
Creat: 1.33 mg/dL — ABNORMAL HIGH (ref 0.70–1.30)
Globulin: 2.2 g/dL (calc) (ref 1.9–3.7)
Glucose, Bld: 92 mg/dL (ref 65–99)
Potassium: 4.1 mmol/L (ref 3.5–5.3)
Sodium: 141 mmol/L (ref 135–146)
Total Bilirubin: 1.1 mg/dL (ref 0.2–1.2)
Total Protein: 6.4 g/dL (ref 6.1–8.1)
eGFR: 65 mL/min/{1.73_m2} (ref >=60)

## 2022-06-26 LAB — LIPID PANEL
Cholesterol: 126 mg/dL (ref <200)
HDL: 37 mg/dL — ABNORMAL LOW (ref >=40)
LDL Cholesterol (Calc): 61 mg/dL (calc)
Non-HDL Cholesterol (Calc): 89 mg/dL (calc) (ref <130)
Total CHOL/HDL Ratio: 3.4 (calc) (ref <5.0)
Triglycerides: 211 mg/dL — ABNORMAL HIGH (ref <150)

## 2022-07-05 ENCOUNTER — Other Ambulatory Visit (HOSPITAL_BASED_OUTPATIENT_CLINIC_OR_DEPARTMENT_OTHER): Payer: Self-pay | Admitting: Family

## 2022-07-05 ENCOUNTER — Other Ambulatory Visit (HOSPITAL_BASED_OUTPATIENT_CLINIC_OR_DEPARTMENT_OTHER): Payer: Self-pay | Admitting: Cardiovascular Disease

## 2022-07-05 DIAGNOSIS — I255 Ischemic cardiomyopathy: Secondary | ICD-10-CM

## 2022-07-05 NOTE — Telephone Encounter (Signed)
Patient of Dr. Chandrasekhar. Please review for refill. Thank you!  

## 2022-07-17 ENCOUNTER — Telehealth: Payer: Self-pay | Admitting: Internal Medicine

## 2022-07-17 NOTE — Telephone Encounter (Signed)
   Pre-operative Risk Assessment    Patient Name: Roberto Morrison  DOB: 1970/10/20 MRN: [id number]      Request for Surgical Clearance    Procedure:   DISTAL TRICEPS REPAIR  Date of Surgery:  Clearance TBD                                 Surgeon:  DR. Ramond Marrow Surgeon's Group or Practice Name:  [facility] Phone number:  [phone] ATTN: Silvestre Mesi Fax number:  [phone]   Type of Clearance Requested:   - Medical ; ASA    Type of Anesthesia:   CHOICE   Additional requests/questions:    Elpidio Anis   07/17/2022, 11:20 AM

## 2022-07-17 NOTE — Telephone Encounter (Signed)
Spoke with patient who states that his procedure is scheduled for 6/20. He has been scheduled for an appointment on 6/18 with Dr. Ronette Deter. Patient verbalized understanding and thanked me for the call.

## 2022-07-17 NOTE — Telephone Encounter (Signed)
   Name: MARTISE WADDELL  DOB: 04/22/1970  MRN: [id number]  Primary Cardiologist: Nanetta Batty, MD  Chart reviewed as part of pre-operative protocol coverage. Because of LISSANDRO DILORENZO past medical history and time since last visit, he will require a follow-up in-office visit in order to better assess preoperative cardiovascular risk.  Pre-op covering staff: - Please schedule appointment and call patient to inform them. If patient already had an upcoming appointment within acceptable timeframe, please add "pre-op clearance" to the appointment notes so provider is aware. - Please contact requesting surgeon's office via preferred method (i.e, phone, fax) to inform them of need for appointment prior to surgery.  Patient can hold ASA x 5-7 days if asymptomatic at the time of his office visit.  Sharlene Dory, PA-C  07/17/2022, 12:33 PM

## 2022-07-17 NOTE — Telephone Encounter (Signed)
Patient stated Roberto Morrison and Roberto Morrison will be faxing request for clearance for him to have surgery on torn tricep.

## 2022-07-24 ENCOUNTER — Encounter: Payer: Self-pay | Admitting: Internal Medicine

## 2022-07-24 ENCOUNTER — Ambulatory Visit: Payer: No Typology Code available for payment source | Attending: Internal Medicine | Admitting: Internal Medicine

## 2022-07-24 ENCOUNTER — Ambulatory Visit: Payer: No Typology Code available for payment source

## 2022-07-24 VITALS — BP 118/88 | HR 76 | Ht 72.0 in | Wt 269.4 lb

## 2022-07-24 DIAGNOSIS — I25709 Atherosclerosis of coronary artery bypass graft(s), unspecified, with unspecified angina pectoris: Secondary | ICD-10-CM | POA: Diagnosis not present

## 2022-07-24 DIAGNOSIS — I422 Other hypertrophic cardiomyopathy: Secondary | ICD-10-CM | POA: Diagnosis not present

## 2022-07-24 DIAGNOSIS — I441 Atrioventricular block, second degree: Secondary | ICD-10-CM

## 2022-07-24 NOTE — Progress Notes (Signed)
Cardiology Office Note:    Date:  07/24/2022   ID:  Roberto Morrison, DOB Dec 20, 1970, MRN [id number]  PCP:  Christen Butter, NP   [facility] Providers Cardiologist:  Nanetta Batty, MD     Referring MD: Christen Butter, NP   CC:HCM f/u  History of Present Illness:    Roberto Morrison is a 52 y.o. male with a hx CAD s/p CABG LIMA to the LAD, pedicle RIMA to the PDA and a left radial to the obtuse marginal branch. Ischemic cardiomyopathy. Thought his imaging found to have elevated LV thickening with VL dilation.  Seen for evaluation of HCM vs Athlete's Heart. 2023: unable to confirm HCM was not amenable to deconditioning.  No ectopy on subsequent testing; no family history of HCM.  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 2024: no further competitions. He has skin surgery under [facility] with no issues.  He had a ruptured triceps and is pending surgery.   Patient notes that he is doing well Since last visit notes that he is still not interested in deconditioning.. There are no interval hospital/ED visit.    No chest pain or pressure .  No SOB/DOE and no PND/Orthopnea.  No weight gain or leg swelling.  No palpitations or syncope .     Past Medical History:  Diagnosis Date   Diabetes mellitus without complication (HCC)    Hyperlipidemia    Myocardial infarction (HCC) 07/26/2020   NSTEMI 07/26/2020    Past Surgical History:  Procedure Laterality Date   CORONARY ARTERY BYPASS GRAFT N/A 07/26/2020   Procedure: CORONARY ARTERY BYPASS GRAFTING (CABG) X THREE, USING LEFT INTERNAL MAMMARY ARTERY< RIGHT INTERNAL MAMMARY ARTERY AND LEFT RADIAL ARTERY CONDUITS.;  Surgeon: Linden Dolin, MD;  Location: [facility] OR;  Service: Open Heart Surgery;  Laterality: N/A;   INTERCOSTAL NERVE BLOCK  07/26/2020   Procedure: INTERCOSTAL NERVE BLOCK;  Surgeon: Linden Dolin, MD;  Location: [facility] OR;  Service: Open Heart Surgery;;   LEFT HEART CATH AND CORONARY ANGIOGRAPHY N/A 07/22/2020   Procedure: LEFT HEART CATH AND CORONARY  ANGIOGRAPHY;  Surgeon: Runell Gess, MD;  Location: [facility] INVASIVE CV LAB;  Service: Cardiovascular;  Laterality: N/A;   RADIAL ARTERY HARVEST Left 07/26/2020   Procedure: RADIAL ARTERY HARVEST;  Surgeon: Linden Dolin, MD;  Location: [facility] OR;  Service: Open Heart Surgery;  Laterality: Left;   TEE WITHOUT CARDIOVERSION N/A 07/26/2020   Procedure: TRANSESOPHAGEAL ECHOCARDIOGRAM (TEE);  Surgeon: Linden Dolin, MD;  Location: [facility] OR;  Service: Open Heart Surgery;  Laterality: N/A;   TRICEPS TENDON REPAIR Left    years ago    Current Medications: Current Meds  Medication Sig   aspirin EC 81 MG EC tablet Take 1 tablet (81 mg total) by mouth daily. Swallow whole.   atorvastatin (LIPITOR) 80 MG tablet TAKE 1 TABLET(80 MG) BY MOUTH DAILY   carvedilol (COREG) 6.25 MG tablet TAKE 1 TABLET(6.25 MG) BY MOUTH TWICE DAILY   empagliflozin (JARDIANCE) 10 MG TABS tablet Take 1 tablet (10 mg total) by mouth daily.   losartan (COZAAR) 50 MG tablet TAKE 1 TABLET(50 MG) BY MOUTH DAILY   traZODone (DESYREL) 50 MG tablet Take 1-2 tablets (50-100 mg total) by mouth at bedtime as needed for sleep.   valACYclovir (VALTREX) 500 MG tablet Take 500 mg by mouth as needed. Per Patient only takes once a year     Allergies:   Prednisone   Social History   Socioeconomic History   Marital status:  Single    Spouse name: Not on file   Number of children: Not on file   Years of education: Not on file   Highest education level: Not on file  Occupational History   Not on file  Tobacco Use   Smoking status: Never   Smokeless tobacco: Never  Substance and Sexual Activity   Alcohol use: Not Currently   Drug use: Never   Sexual activity: Not on file  Other Topics Concern   Not on file  Social History Narrative   Not on file   Social Determinants of Health   Financial Resource Strain: Not on file  Food Insecurity: Not on file  Transportation Needs: Not on file  Physical Activity: Not on file  Stress:  Not on file  Social Connections: Not on file    Social: [employer] [place] in the past.  He recently won [employer] [facility] (2023)  Family History: History of coronary artery disease notable for no members. History of heart failure notable for no members. History of arrhythmia notable for no members. Denies family history of sudden cardiac death including drowning, car accidents, or unexplained deaths in the family. No history of bicuspid aortic valve or aortic aneurysm or dissection.   No history of cardiomyopathies including hypertrophic cardiomyopathy, left ventricular non-compaction, or arrhythmogenic right ventricular cardiomyopathy.   ROS:   Please see the history of present illness.     All other systems reviewed and are negative.  EKGs/Labs/Other Studies Reviewed:    The following studies were reviewed today:  EKG: 3/17/23SR LAFB  Cardiac Studies & Procedures   CARDIAC CATHETERIZATION  CARDIAC CATHETERIZATION 07/22/2020  Narrative Images from the original result were not included.   Prox LAD to Mid LAD lesion is 90% stenosed.  Prox Cx to Mid Cx lesion is 90% stenosed.  Mid RCA lesion is 50% stenosed.  Roberto Morrison is a 52 y.o. male   [id number] LOCATION:  FACILITY: [facility] PHYSICIAN: Nanetta Batty, M.D. October 12, 1970   DATE OF PROCEDURE:  07/22/2020  DATE OF DISCHARGE:     CARDIAC CATHETERIZATION    History obtained from chart review.52 y.o. male with no PMH who presented with chest pain after being at the gym and noted to have NSTEMI.  Impression Mr. Errante was admitted with a non-STEMI.  He has three-vessel disease.  He has a long segmental mid LAD lesion and a moderately long mid AV groove circumflex lesion lesion with what appears to be a moderate though not significant distal RCA lesion.  His filling pressures were normal.  His EF looked preserved.  I believe he would be best served with coronary artery bypass grafting with bilateral IMA's.   Surgical consult has been called.  The sheath was removed and a TR band was placed on the right wrist to achieve patent hemostasis.  The patient left the lab in stable condition.  Plan will be to restart IV heparin 4 hours after sheath removal.  Nanetta Batty. MD, [facility] 07/22/2020 4:11 PM  Findings Coronary Findings Diagnostic  Dominance: Right  Left Anterior Descending Prox LAD to Mid LAD lesion is 90% stenosed.  Left Circumflex Prox Cx to Mid Cx lesion is 90% stenosed.  Right Coronary Artery Mid RCA lesion is 50% stenosed.  Intervention  No interventions have been documented.   STRESS TESTS  EXERCISE TOLERANCE TEST (ETT) 07/13/2021  Narrative   The ECG was negative for ischemia.   No ST deviation was noted.   Exercise capacity was  excellent. Stage 4 was reached after exercising for 11 min and 39 sec. Maximum HR of 145 bpm. MPHR 85.0 %. Peak METS 13.7 .   Hypertensive blood pressure and normal heart rate response noted during stress.   ECHOCARDIOGRAM  ECHOCARDIOGRAM COMPLETE 05/01/2021  Narrative ECHOCARDIOGRAM REPORT    Patient Name:   Kyrie ANASTASIO BONSELL Date of Exam: 05/01/2021 Medical Rec #:  [id number]     Height:       72.0 in Accession #:    [id number]    Weight:       284.0 lb Date of Birth:  26-Aug-1970      BSA:          2.473 m Patient Age:    51 years      BP:           126/90 mmHg Patient Gender: M             HR:           63 bpm. Exam Location:  [address]  Procedure: 2D Echo, 3D Echo, Cardiac Doppler, Color Doppler and Strain Analysis  Indications:    I42 Asymmetric septal hypertrophy  History:        Patient has prior history of Echocardiogram examinations, most recent 10/18/2020. Previous Myocardial Infarction, Prior CABG; Risk Factors:Dyslipidemia. ASH.  Sonographer:    Jorje Guild BS, RDCS Referring Phys: [phone] JONATHAN J BERRY  IMPRESSIONS   1. Left ventricular ejection fraction, by estimation, is 50 to 55%. The left ventricle has low normal  function. The left ventricle demonstrates regional wall motion abnormalities (see scoring diagram/findings for description). All basal-to-mid septal segments are hypokinetic.There is severe asymmetric left ventricular hypertrophy of the basal-septal segment. Left ventricular diastolic parameters were normal. The average left ventricular global longitudinal strain is -20.4 %. The global longitudinal strain is normal. 2. Right ventricular systolic function is mildly reduced. The right ventricular size is normal. 3. The mitral valve is normal in structure. Mild mitral valve regurgitation. 4. The aortic valve is tricuspid. There is mild thickening of the aortic valve. Aortic valve regurgitation is mild. Aortic valve sclerosis is present, with no evidence of aortic valve stenosis. 5. Aortic dilatation noted. There is mild dilatation of the aortic root, measuring 39 mm. There is mild dilatation of the ascending aorta, measuring 37 mm.  Comparison(s): Compared to prior TTE on 10/18/20, the LVEF has improved to 50-55% (previously EF 40-45%). Otherwise, there is no significant change.  FINDINGS Left Ventricle: Left ventricular ejection fraction, by estimation, is 50 to 55%. Left ventricular ejection fraction by 3D volume is 54 %. The left ventricle has low normal function. The left ventricle demonstrates regional wall motion abnormalities. All basal-to-mid septal segments are hypokinetic. The average left ventricular global longitudinal strain is -20.4 %. The global longitudinal strain is normal. The left ventricular internal cavity size was normal in size. There is severe asymmetric left ventricular hypertrophy of the basal-septal segment. Abnormal (paradoxical) septal motion consistent with post-operative status. Left ventricular diastolic parameters were normal.  Right Ventricle: The right ventricular size is normal. No increase in right ventricular wall thickness. Right ventricular systolic function is  mildly reduced.  Left Atrium: Left atrial size was normal in size.  Right Atrium: Right atrial size was normal in size.  Pericardium: There is no evidence of pericardial effusion.  Mitral Valve: The mitral valve is normal in structure. There is mild thickening of the mitral valve leaflet(s). Mild mitral valve regurgitation.  Tricuspid Valve: The tricuspid  valve is normal in structure. Tricuspid valve regurgitation is trivial.  Aortic Valve: The aortic valve is tricuspid. There is mild thickening of the aortic valve. Aortic valve regurgitation is mild. Aortic valve sclerosis is present, with no evidence of aortic valve stenosis.  Pulmonic Valve: The pulmonic valve was normal in structure. Pulmonic valve regurgitation is trivial.  Aorta: Aortic dilatation noted. There is mild dilatation of the aortic root, measuring 39 mm. There is mild dilatation of the ascending aorta, measuring 37 mm.  IAS/Shunts: No atrial level shunt detected by color flow Doppler.   LEFT VENTRICLE PLAX 2D LVIDd:         5.90 cm         Diastology LVIDs:         4.20 cm         LV e' medial:    8.10 cm/s LV PW:         1.00 cm         LV E/e' medial:  10.2 LV IVS:        1.10 cm         LV e' lateral:   18.20 cm/s LVOT diam:     2.60 cm         LV E/e' lateral: 4.5 LV SV:         124 LV SV Index:   50              2D LVOT Area:     5.31 cm        Longitudinal Strain 2D Strain GLS  -17.2 % (A2C): 2D Strain GLS  -22.6 % (A3C): 2D Strain GLS  -21.3 % (A4C): 2D Strain GLS  -20.4 % Avg:  3D Volume EF LV 3D EF:    Left ventricul ar ejection fraction by 3D volume is 54 %.  3D Volume EF: 3D EF:        54 % LV EDV:       200 ml LV ESV:       92 ml LV SV:        108 ml  RIGHT VENTRICLE            IVC RV Basal diam:  4.20 cm    IVC diam: 1.60 cm RV S prime:     7.80 cm/s TAPSE (M-mode): 1.5 cm  LEFT ATRIUM             Index        RIGHT ATRIUM           Index LA diam:        5.30 cm 2.14 cm/m    RA Pressure: 3.00 mmHg LA Vol (A2C):   61.6 ml 24.91 ml/m  RA Area:     12.50 cm LA Vol (A4C):   38.5 ml 15.57 ml/m  RA Volume:   26.10 ml  10.55 ml/m LA Biplane Vol: 49.7 ml 20.10 ml/m AORTIC VALVE LVOT Vmax:   119.00 cm/s LVOT Vmean:  73.900 cm/s LVOT VTI:    0.233 m  AORTA Ao Root diam: 3.90 cm Ao Asc diam:  3.70 cm  MITRAL VALVE               TRICUSPID VALVE Estimated RAP:  3.00 mmHg MV Decel Time: 225 msec MV E velocity: 82.30 cm/s  SHUNTS MV A velocity: 66.50 cm/s  Systemic VTI:  0.23 m MV E/A ratio:  1.24        Systemic Diam: 2.60 cm  Laurance Flatten MD Electronically signed by Laurance Flatten MD Signature Date/Time: 05/01/2021/10:49:46 AM    Final   TEE  ECHO INTRAOPERATIVE TEE 07/26/2020  Narrative *INTRAOPERATIVE TRANSESOPHAGEAL REPORT *    Patient Name:   Ferry KHORY MAZZARESE  Date of Exam: 07/26/2020 Medical Rec #:  [id number]      Height:       72.0 in Accession #:    [id number]     Weight:       280.6 lb Date of Birth:  04/06/1970       BSA:          2.46 m Patient Age:    50 years       BP:           135/85 mmHg Patient Gender: M              HR:           73 bpm. Exam Location:  Anesthesiology  Transesophogeal exam was perform intraoperatively during surgical procedure. Patient was closely monitored under general anesthesia during the entirety of examination.  Indications:     Coronary Artery Disease Performing Phys: [id number] ATKINS Diagnosing Phys: Kipp Brood MD  Complications: No known complications during this procedure. PRE-OP FINDINGS Left Ventricle: The LV end-diastolic diameter was enlarged and measured 6.3 cm at the mid-papillary level in the trans-gastric short axis view. There was normal LV wall thicness and normal LV systolic function with the ejection fraction calculated at 55-60% using the 3DQ software. There were no regional wall motion abnormalities.  On the post-bypass view there was mild to moderate global LV  dysfunction without regional wall motion abnormalities. The ejection fraction was calculated at 40-45% using the 2D Simpson's method.   Right Ventricle: The right ventricle has normal systolic function. The cavity was normal. There is no increase in right ventricular wall thickness. The right ventricle was normal in size with normal systolic function.  On the post-bypass exam, the RV cavity was enlarged from the pre-bypass and there was mild-moderate RV systolic dysfunction.  Left Atrium: The left atrium was enlarged and measured 4.4 cm in diameter in the medial-lateral dimension. The left atrial appendage is well visualized and there is no evidence of thrombus present.  Right Atrium: Right atrial size was normal in size.  Interatrial Septum: No atrial level shunt detected by color flow Doppler. There is no evidence of a patent foramen ovale.  Pericardium: There is no evidence of pericardial effusion.  Mitral Valve: The mitral valve is normal in structure. Mitral valve regurgitation is trivial by color flow Doppler. There is No evidence of mitral stenosis.  Tricuspid Valve: The tricuspid valve was normal in structure. Tricuspid valve regurgitation is trivial by color flow Doppler. No evidence of tricuspid stenosis is present.  Aortic Valve: The aortic valve is tricuspid Aortic valve regurgitation is mild by color flow Doppler. The jet is centrally-directed. There is no stenosis of the aortic valve.   Pulmonic Valve: The pulmonic valve was normal in structure, with normal. No evidence of pulmonic stenosis. Pulmonic valve regurgitation is trivial by color flow Doppler.   Aorta: The aortic root and ascending aorta are normal in size and structure. The aortic root and proximal ascending aorta were normal in contour and diameter with normal wall thickness with no evidence of atherosclerotic disease.  The descending aorta was normal in diameter and free of atherosclerotic disease.  Aortic  root: 3.4 cm Sinotubular junction: 2.5 cm Proximal ascending aorta: 3.3  cm Descending thoracic aorta: 2.8 cm.  Shunts: There is no evidence of an atrial septal defect.   Kipp Brood MD Electronically signed by Kipp Brood MD Signature Date/Time: 07/26/2020/5:18:27 PM    Final   MONITORS  LONG TERM MONITOR (3-14 DAYS) 07/15/2021  Narrative  Patient had a minimum heart rate of 30 bpm, maximum heart rate of 151 bpm, and average heart rate of 75 bpm.  Predominant underlying rhythm was sinus rhythm.  Isolated PACs were rare (<1.0%).  Isolated PVCs were rare (<1.0%).  One episode of Wenckebach heart block, asymptomatic, ~1030 AM.  Triggered and diary events associated with sinus rhythm.  No NSVT in the setting of ventricular hypertrophy.    CARDIAC MRI  MR CARDIAC MORPHOLOGY W WO CONTRAST 05/22/2021  Narrative CLINICAL DATA:  Clinical question of hypertrophic cardiomyopathy Study assumes HCT of 50 and BSA of 2.56 m2.  EXAM: CARDIAC MRI  TECHNIQUE: The patient was scanned on a 1.5 Tesla GE magnet. A dedicated cardiac coil was used. Functional imaging was done using Fiesta sequences. 2,3, and 4 chamber views were done to assess for RWMA's. Modified Simpson's rule using a short axis stack was used to calculate an ejection fraction on a dedicated work [employer]. The patient received 10 cc of Gadavist. After 10 minutes inversion recovery sequences were used to assess for infiltration and scar tissue.  CONTRAST:  10 cc  of Gadavist  FINDINGS: 1. Mild left ventricular dilation, with LVEDD 66 mm, but LVEDVi 96 mL/m2.  Asymmetric septal hypertrophy, with intraventricular septal thickness of 17 mm, posterior wall thickness of 8 mm, and myocardial mass index of 92 g/m2, mildly elevated.  Moderately reduced left ventricular systolic function (LVEF =39%). Apical septal hypokinesis, mid septal hypokinesis, basal septal hypokinesis. There is no  evidence of systolic anterior motion of the mitral valve or of LVOT Obstruction.  Left ventricular parametric mapping notable for normal native T1, T2 signal.  There is no late gadolinium enhancement in the left ventricular myocardium.  2. Normal right ventricular size with RVEDVI 54 mL/m2.  Normal right ventricular thickness.  Normal right ventricular systolic function (RVEF =48%). There are no regional wall motion abnormalities or aneurysms.  3.  Normal left and right atrial size.  4. Normal size of the aortic root, ascending aorta and pulmonary artery.  5. Valve assessment:  Aortic Valve: Tri-leaflet. Qualitatively mild central regurgitation.  Pulmonic Valve: Qualitatively, there is no significant regurgitation.  Tricuspid Valve: Qualitatively mild central regurgitation.  Mitral Valve: Qualitatively, there is no significant regurgitation.  6.  Normal pericardium.  No pericardial effusion.  7. Grossly, no extracardiac findings. Prior median sternotomy scar noted with associated artifact. Recommended dedicated study if concerned for non-cardiac pathology.  8. Wrap around and breath hold artifacts noted. This decreased the sensitivity of volumetric evaluation of valve disease.  IMPRESSION: No evidence of infiltrative disease.  In lieu of system disease, study would be consistent with hypertrophic cardiomyopathy.  Because of wall motion related to prior ischemia, cannot use strain imaging to differentiate athlete's heart hypertrophy from pathogenic hypertrophy.  Riley Lam MD   Electronically Signed By: Riley Lam M.D. On: 05/22/2021 13:07          Recent Labs: 12/27/2021: Magnesium 2.0; NT-Pro BNP 77 06/25/2022: ALT 53; BUN 26; Creat 1.33; Hemoglobin 18.0; Platelets 169; Potassium 4.1; Sodium 141  Recent Lipid Panel    Component Value Date/Time   CHOL 126 06/25/2022 1453   CHOL 113 09/12/2020 0816   TRIG  211 (H) 06/25/2022 1453    HDL 37 (L) 06/25/2022 1453   HDL 33 (L) 09/12/2020 0816   CHOLHDL 3.4 06/25/2022 1453   VLDL 14 07/25/2020 0928   LDLCALC 61 06/25/2022 1453        Physical Exam:    VS:  BP 118/88   Pulse 76   Ht 6' (1.829 m)   Wt 269 lb 6.4 oz (122.2 kg)   SpO2 95%   BMI 36.54 kg/m     Wt Readings from Last 3 Encounters:  07/24/22 269 lb 6.4 oz (122.2 kg)  06/25/22 269 lb 1.3 oz (122.1 kg)  01/29/22 255 lb (115.7 kg)    Gen: no distress    Neck: No JVD  Cardiac: No Rubs or Gallops, no murmur, RRR +2 Right radial pulse, L radial scar  Respiratory: Clear to auscultation bilaterally, normal effort, normal  respiratory rate GI: Soft, nontender, non-distended  MS: No  edema;  moves all extremities Integument: Skin feels warm Neuro:  At time of evaluation, alert and oriented to person/place/time/situation  Psych: Normal affect, patient feels well   ASSESSMENT:    1. Coronary artery disease involving coronary bypass graft of native heart with angina pectoris (HCC)   2. AV block, Mobitz 1   3. Asymmetric septal hypertrophy (HCC)      PLAN:    Hypertrophic Cardiomyopathy versus Athlete's Heart Ischemic cardiomyopathy  - NYHA Class I - controlled on Jardiance 10, coreg 6.25 mg PO BID, and 25 mg PO daily losartan, LVEF 39% - we have discussed , in the past deconditioning and re-imaging.  He has not been in support of this, we will screen him yearly looking for ectopy; he had Mobitz Type 1 HB; getting Zio patch - SCD risk  is increased in the setting of LVEF < 50%; potential Athlete's Heart, inability to confirm with strain; unamenable to decondition. He has had no NSVT - we have discussed that if he has syncope we will need to rediscussed ICD; we have shown him a subcutaneous ICD today - I recommend genetic testing as this would help [place] in clarifying his diagnosis; based on this we would consider having him meet Dr. Karrie Doffing for risk consultation.  He is moderate risk for low risk  procedure and did well with recent low risk procedure: proceeding with his triceps surgery is reasonabl  CAD s/p CABG (all arterial) will stop plavix and continue ASA - Mild AI next echo in 2026 - continue ASA and statin   Keep November Apt          Medication Adjustments/Labs and Tests Ordered: Current medicines are reviewed at length with the patient today.  Concerns regarding medicines are outlined above.  Orders Placed This Encounter  Procedures   Ambulatory referral to Genetics   LONG TERM MONITOR (3-14 DAYS)   EKG 12-Lead   No orders of the defined types were placed in this encounter.   Patient Instructions  Medication Instructions:  Your physician recommends that you continue on your current medications as directed. Please refer to the Current Medication list given to you today.  *If you need a refill on your cardiac medications before your next appointment, please call your pharmacy*   Lab Work: NONE If you have labs (blood work) drawn today and your tests are completely normal, you will receive your results only by: MyChart Message (if you have MyChart) OR A paper copy in the mail If you have any lab test that is abnormal or  we need to change your treatment, we will call you to review the results.   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    Follow-Up:As scheduled At [facility], you and your health needs are our priority.  As part of our continuing mission to provide you with exceptional heart care, we have created designated Provider Care Teams.  These Care Teams include your primary Cardiologist (physician) and Advanced Practice Providers (APPs -  Physician Assistants and Nurse Practitioners) who all work together to provide you with the care you need, when you need it.   Provider:   Riley Lam, MD  Other Instructions Christena Deem- Long Term Monitor  Instructions  Your physician has requested you wear a ZIO patch monitor for 7 days.  This is a single patch monitor. [name] supplies one patch monitor per enrollment. Additional stickers are not available. Please do not apply patch if you will be having a Nuclear Stress Test,   Cardiac CT, MRI, or Chest Xray during the period you would be wearing the  monitor. The patch cannot be worn during these tests. You cannot remove and re-apply the  [name] patch monitor.  Your ZIO patch monitor will be mailed 3 day USPS to your address on file. It may take 3-5 days  to receive your monitor after you have been enrolled.  Once you have received your monitor, please review the enclosed instructions. Your monitor  has already been registered assigning a specific monitor serial # to you.  Billing and Patient Assistance Program Information  We have supplied [name] with any of your insurance information on file for billing purposes. [name] offers a sliding scale Patient Assistance Program for patients that do not have  insurance, or whose insurance does not completely cover the cost of the ZIO monitor.  You must apply for the Patient Assistance Program to qualify for this discounted rate.  To apply, please call [name] at [phone], select option 4, select option 2, ask to apply for  Patient Assistance Program. [name] will ask your household income, and how many people  are in your household. They will quote your out-of-pocket cost based on that information.  [name] will also be able to set up a 36-month, interest-free payment plan if needed.  Applying the monitor   Shave hair from upper left chest.  Hold abrader disc by orange tab. Rub abrader in 40 strokes over the upper left chest as  indicated in your monitor instructions.  Clean area with 4 enclosed alcohol pads. Let dry.  Apply patch as indicated in monitor instructions. Patch will be placed under collarbone on left  side of chest with  arrow pointing upward.  Rub patch adhesive wings for 2 minutes. Remove white label marked "1". Remove the white  label marked "2". Rub patch adhesive wings for 2 additional minutes.  While looking in a mirror, press and release button in center of patch. A small green light will  flash 3-4 times. This will be your only indicator that the monitor has been turned on.  Do not shower for the first 24 hours. You may shower after the first 24 hours.  Press the button if you feel a symptom. You will hear a small click. Record Date, Time and  Symptom in the Patient Logbook.  When you are ready to remove the patch, follow instructions on the last 2 pages of Patient  Logbook. Stick patch monitor onto the last page of  Patient Logbook.  Place Patient Logbook in the blue and white box. Use locking tab on box and tape box closed  securely. The blue and white box has prepaid postage on it. Please place it in the mailbox as  soon as possible. Your physician should have your test results approximately 7 days after the  monitor has been mailed back to [facility].  Call [facility] Customer Care at [phone] if you have questions regarding  your [name] patch monitor. Call them immediately if you see an orange light blinking on your  monitor.  If your monitor falls off in less than 4 days, contact our Monitor department at [phone].  If your monitor becomes loose or falls off after 4 days call [name] at [phone] for  suggestions on securing your monitor     Signed, Christell Constant, MD  07/24/2022 1:02 PM    [facility]

## 2022-07-24 NOTE — H&P (Signed)
PREOPERATIVE H&P  Chief Complaint: RIGHT TRICEPS RUPTURE  HPI: Roberto Morrison is a 52 y.o. male who is scheduled for, Procedure(s): TRICEPS TENDON REPAIR.   Patient has a past medical history significant for DM, HLD, MI.   Roberto Morrison is a 52 year old gentleman had an injury to the right elbow when weightlifting on 07/03/22. Acute onset pain. He was doing 135 dumbell presses .  He has torn his left triceps before and had surgery on that.   Symptoms are rated as moderate to severe, and have been worsening.  This is significantly impairing activities of daily living.    Please see clinic note for further details on this patient's care.    He has elected for surgical management.   Past Medical History:  Diagnosis Date   Diabetes mellitus without complication (HCC)    Hyperlipidemia    Myocardial infarction (HCC) 07/26/2020   NSTEMI 07/26/2020   Past Surgical History:  Procedure Laterality Date   CORONARY ARTERY BYPASS GRAFT N/A 07/26/2020   Procedure: CORONARY ARTERY BYPASS GRAFTING (CABG) X THREE, USING LEFT INTERNAL MAMMARY ARTERY< RIGHT INTERNAL MAMMARY ARTERY AND LEFT RADIAL ARTERY CONDUITS.;  Surgeon: Linden Dolin, MD;  Location: [facility] OR;  Service: Open Heart Surgery;  Laterality: N/A;   INTERCOSTAL NERVE BLOCK  07/26/2020   Procedure: INTERCOSTAL NERVE BLOCK;  Surgeon: Linden Dolin, MD;  Location: [facility] OR;  Service: Open Heart Surgery;;   LEFT HEART CATH AND CORONARY ANGIOGRAPHY N/A 07/22/2020   Procedure: LEFT HEART CATH AND CORONARY ANGIOGRAPHY;  Surgeon: Runell Gess, MD;  Location: [facility] INVASIVE CV LAB;  Service: Cardiovascular;  Laterality: N/A;   RADIAL ARTERY HARVEST Left 07/26/2020   Procedure: RADIAL ARTERY HARVEST;  Surgeon: Linden Dolin, MD;  Location: [facility] OR;  Service: Open Heart Surgery;  Laterality: Left;   TEE WITHOUT CARDIOVERSION N/A 07/26/2020   Procedure: TRANSESOPHAGEAL ECHOCARDIOGRAM (TEE);  Surgeon: Linden Dolin, MD;  Location: [facility];   Service: Open Heart Surgery;  Laterality: N/A;   TRICEPS TENDON REPAIR Left    years ago   Social History   Socioeconomic History   Marital status: Single    Spouse name: Not on file   Number of children: Not on file   Years of education: Not on file   Highest education level: Not on file  Occupational History   Not on file  Tobacco Use   Smoking status: Never   Smokeless tobacco: Never  Substance and Sexual Activity   Alcohol use: Not Currently   Drug use: Never   Sexual activity: Not on file  Other Topics Concern   Not on file  Social History Narrative   Not on file   Social Determinants of Health   Financial Resource Strain: Not on file  Food Insecurity: Not on file  Transportation Needs: Not on file  Physical Activity: Not on file  Stress: Not on file  Social Connections: Not on file   Family History  Problem Relation Age of Onset   Healthy Mother    Healthy Father    Colon cancer Neg Hx    Colon polyps Neg Hx    Esophageal cancer Neg Hx    Stomach cancer Neg Hx    Rectal cancer Neg Hx    Allergies  Allergen Reactions   Prednisone Other (See Comments)    "Hiccups"    Prior to Admission medications   Medication Sig Start Date End Date Taking? Authorizing Provider  aspirin EC 81  MG EC tablet Take 1 tablet (81 mg total) by mouth daily. Swallow whole. 07/31/20   Leary Roca, PA-C  atorvastatin (LIPITOR) 80 MG tablet TAKE 1 TABLET(80 MG) BY MOUTH DAILY 07/05/22   Runell Gess, MD  carvedilol (COREG) 6.25 MG tablet TAKE 1 TABLET(6.25 MG) BY MOUTH TWICE DAILY 01/24/22   Chandrasekhar, Mahesh A, MD  empagliflozin (JARDIANCE) 10 MG TABS tablet Take 1 tablet (10 mg total) by mouth daily. 07/05/22   Christell Constant, MD  losartan (COZAAR) 50 MG tablet TAKE 1 TABLET(50 MG) BY MOUTH DAILY 04/10/22   Runell Gess, MD  traZODone (DESYREL) 50 MG tablet Take 1-2 tablets (50-100 mg total) by mouth at bedtime as needed for sleep. 06/25/22   Christen Butter,  NP  valACYclovir (VALTREX) 500 MG tablet Take 500 mg by mouth as needed. Per Patient only takes once a year 09/07/21   [provider]    ROS: All other systems have been reviewed and were otherwise negative with the exception of those mentioned in the HPI and as above.  Physical Exam: General: Alert, no acute distress Cardiovascular: No pedal edema Respiratory: No cyanosis, no use of accessory musculature GI: No organomegaly, abdomen is soft and non-tender Skin: No lesions in the area of chief complaint Neurologic: Sensation intact distally Psychiatric: Patient is competent for consent with normal mood and affect Lymphatic: No axillary or cervical lymphadenopathy  MUSCULOSKELETAL:  On examination of the right triceps he has a obvious palpable defect. He has an obvious bruising and swelling. He has a full range of motion with his symmetric motion being about 3 degrees short of full extension to 130 degrees of flexion.  Imaging: MRI demonstrates a complete triceps rupture with retraction.    Assessment: RIGHT TRICEPS RUPTURE  Plan: Plan for Procedure(s): TRICEPS TENDON REPAIR  The risks benefits and alternatives were discussed with the patient including but not limited to the risks of nonoperative treatment, versus surgical intervention including infection, bleeding, nerve injury,  blood clots, cardiopulmonary complications, morbidity, mortality, among others, and they were willing to proceed.   The patient acknowledged the explanation, agreed to proceed with the plan and consent was signed.   Patient received cardiac clearance prior to surgery.   Operative Plan: Right distal triceps repair Discharge Medications: standard DVT Prophylaxis: none Physical Therapy: outpatient PT Special Discharge needs: Splint. Sling.    Vernetta Honey, PA-C  07/24/2022 9:19 PM

## 2022-07-24 NOTE — Progress Notes (Unsigned)
Enrolled patient for a 7 day [name] monitor to be mailed to patients home.  

## 2022-07-24 NOTE — Telephone Encounter (Signed)
Will forward to pre op APP to confirm if pt has been cleared. Pt was seen in the office today by Dr. Izora Ribas.

## 2022-07-24 NOTE — Patient Instructions (Signed)
Medication Instructions:  Your physician recommends that you continue on your current medications as directed. Please refer to the Current Medication list given to you today.  *If you need a refill on your cardiac medications before your next appointment, please call your pharmacy*   Lab Work: NONE If you have labs (blood work) drawn today and your tests are completely normal, you will receive your results only by: MyChart Message (if you have MyChart) OR A paper copy in the mail If you have any lab test that is abnormal or we need to change your treatment, we will call you to review the results.   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    Follow-Up:As scheduled At [name], you and your health needs are our priority.  As part of our continuing mission to provide you with exceptional heart care, we have created designated Provider Care Teams.  These Care Teams include your primary Cardiologist (physician) and Advanced Practice Providers (APPs -  Physician Assistants and Nurse Practitioners) who all work together to provide you with the care you need, when you need it.   Provider:   Riley Lam, MD  Other Instructions Roberto Morrison- Long Term Monitor Instructions  Your physician has requested you wear a ZIO patch monitor for 7 days.  This is a single patch monitor. [name] supplies one patch monitor per enrollment. Additional stickers are not available. Please do not apply patch if you will be having a Nuclear Stress Test,   Cardiac CT, MRI, or Chest Xray during the period you would be wearing the  monitor. The patch cannot be worn during these tests. You cannot remove and re-apply the  [name] patch monitor.  Your ZIO patch monitor will be mailed 3 day USPS to your address on file. It may take 3-5 days  to receive your monitor after you have been enrolled.  Once you have received your monitor,  please review the enclosed instructions. Your monitor  has already been registered assigning a specific monitor serial # to you.  Billing and Patient Assistance Program Information  We have supplied [name] with any of your insurance information on file for billing purposes. [name] offers a sliding scale Patient Assistance Program for patients that do not have  insurance, or whose insurance does not completely cover the cost of the ZIO monitor.  You must apply for the Patient Assistance Program to qualify for this discounted rate.  To apply, please call [name] at [phone], select option 4, select option 2, ask to apply for  Patient Assistance Program. Roberto Morrison will ask your household income, and how many people  are in your household. They will quote your out-of-pocket cost based on that information.  [name] will also be able to set up a 87-month, interest-free payment plan if needed.  Applying the monitor   Shave hair from upper left chest.  Hold abrader disc by orange tab. Rub abrader in 40 strokes over the upper left chest as  indicated in your monitor instructions.  Clean area with 4 enclosed alcohol pads. Let dry.  Apply patch as indicated in monitor instructions. Patch will be placed under collarbone on left  side of chest with arrow pointing upward.  Rub patch adhesive wings for 2 minutes. Remove white label marked "1". Remove the white  label marked "2". Rub patch adhesive wings for 2 additional minutes.  While looking in a mirror, press and release button in center of patch.  A small green light will  flash 3-4 times. This will be your only indicator that the monitor has been turned on.  Do not shower for the first 24 hours. You may shower after the first 24 hours.  Press the button if you feel a symptom. You will hear a small click. Record Date, Time and  Symptom in the Patient Logbook.  When you are ready to remove the patch, follow instructions on the last 2 pages of  Patient  Logbook. Stick patch monitor onto the last page of Patient Logbook.  Place Patient Logbook in the blue and white box. Use locking tab on box and tape box closed  securely. The blue and white box has prepaid postage on it. Please place it in the mailbox as  soon as possible. Your physician should have your test results approximately 7 days after the  monitor has been mailed back to [name].  Call [name] Customer Care at [phone] if you have questions regarding  your [name] patch monitor. Call them immediately if you see an orange light blinking on your  monitor.  If your monitor falls off in less than 4 days, contact our Monitor department at [phone].  If your monitor becomes loose or falls off after 4 days call [name] at [phone] for  suggestions on securing your monitor

## 2022-07-24 NOTE — Telephone Encounter (Signed)
Roberto Morrison called in to f/u on Clearance. Pt was seen today.. note is not finished yet, but she just wanted to ensure that clearance was addressed today.

## 2022-07-25 ENCOUNTER — Encounter (HOSPITAL_BASED_OUTPATIENT_CLINIC_OR_DEPARTMENT_OTHER): Payer: Self-pay | Admitting: Orthopaedic Surgery

## 2022-07-25 ENCOUNTER — Other Ambulatory Visit: Payer: Self-pay

## 2022-07-25 NOTE — Discharge Instructions (Signed)
Ramond Marrow MD, MPH Alfonse Alpers, PA-C [facility] [address] [phone] (tel)   [phone] (fax)   POST-OPERATIVE INSTRUCTIONS   WOUND CARE Please keep splint clean dry and intact until followup.  You may shower on Post-Op Day #3.  You must keep splint dry during this process and may find that a plastic bag taped around the extremity or alternatively a towel based bath may be a better option.   If you get your splint wet or if it is damaged please contact our clinic.  EXERCISES Due to your splint being in place you will not be able to bear weight through your extremity.    You may use a sling for comfort   It is normal for your fingers/hand to become more swollen after surgery and discolored from bruising.   This will resolve over the first few weeks usually after surgery. Please continue to ambulate and do not stay sitting or lying for too long.  Perform foot and wrist pumps to assist in circulation.   REGIONAL ANESTHESIA (NERVE BLOCKS) The anesthesia team may have performed a nerve block for you this is a great tool used to minimize pain.   The block may start wearing off overnight (between 8-24 hours postop) When the block wears off, your pain may go from nearly zero to the pain you would have had postop without the block. This is an abrupt transition but nothing dangerous is happening.   This can be a challenging period but utilize your as needed pain medications to try and manage this period. We suggest you use the pain medication the first night prior to going to bed, to ease this transition.  You may take an extra dose of narcotic when this happens if needed   POST-OP MEDICATIONS- Multimodal approach to pain control In general your pain will be controlled with a combination of substances.  Prescriptions unless otherwise discussed are electronically sent to your pharmacy.  This is a carefully made plan we use to minimize  narcotic use.     Gabapentin -  this is to help with nerve based pain, take on a scheduled basis Acetaminophen - Non-narcotic pain medicine taken on a scheduled basis  Oxycodone - This is a strong narcotic, to be used only on an "as needed" basis for SEVERE pain. Zofran - take as needed for nausea   FOLLOW-UP If you develop a Fever (>101.5), Redness or Drainage from the surgical incision site, please call our office to arrange for an evaluation. Please call the office to schedule a follow-up appointment for your incision check if you do not already have one, 7-10 days post-operatively.   HELPFUL INFORMATION    You may be more comfortable sleeping in a semi-seated position the first few nights following surgery.  Keep a pillow propped under the elbow and forearm for comfort.  If you have a recliner type of chair it might be beneficial.  If not that is fine too, but it would be helpful to sleep propped up with pillows behind your operated shoulder as well under your elbow and forearm.  This will reduce pulling on the suture lines.   When dressing, put your operative arm in the sleeve first.  When getting undressed, take your operative arm out last.  Loose fitting, button-down shirts are recommended.  Often in the first days after surgery you may be more comfortable keeping your operative arm under your shirt and not through the sleeve.  You may return to work/school in the next couple of days when you feel up to it.  Desk work and typing in the sling is fine.   We suggest you use the pain medication the first night prior to going to bed, in order to ease any pain when the anesthesia wears off. You should avoid taking pain medications on an empty stomach as it will make you nauseous.   You should wean off your narcotic medicines as soon as you are able.  Most patients will be off narcotics before their first postop appointment.    Do not drink alcoholic beverages or take illicit drugs when  taking pain medications.   It is against the law to drive while taking narcotics.  In some states it is against the law to drive while your arm is in a sling.    Pain medication may make you constipated.  Below are a few solutions to try in this order:   - Decrease the amount of pain medication if you aren't having pain.   - Drink lots of decaffeinated fluids.   - Drink prune juice and/or each dried prunes   If the first 3 don't work start with additional solutions   - Take Colace - an over-the-counter stool softener   - Take Senokot - an over-the-counter laxative   - Take Miralax - a stronger over-the-counter laxative   For more information including helpful videos and documents visit our website:   [email or web address]    Post Anesthesia Home Care Instructions  Activity: Get plenty of rest for the remainder of the day. A responsible individual must stay with you for 24 hours following the procedure.  For the next 24 hours, DO NOT: -Drive a car -Advertising copywriter -Drink alcoholic beverages -Take any medication unless instructed by your physician -Make any legal decisions or sign important papers.  Meals: Start with liquid foods such as gelatin or soup. Progress to regular foods as tolerated. Avoid greasy, spicy, heavy foods. If nausea and/or vomiting occur, drink only clear liquids until the nausea and/or vomiting subsides. Call your physician if vomiting continues.  Special Instructions/Symptoms: Your throat may feel dry or sore from the anesthesia or the breathing tube placed in your throat during surgery. If this causes discomfort, gargle with warm salt water. The discomfort should disappear within 24 hours.     Regional Anesthesia Blocks  1. Numbness or the inability to move the "blocked" extremity may last from 3-48 hours after placement. The length of time depends on the medication injected and your individual response to the medication.  If the numbness is not going away after 48 hours, call your surgeon.  2. The extremity that is blocked will need to be protected until the numbness is gone and the  Strength has returned. Because you cannot feel it, you will need to take extra care to avoid injury. Because it may be weak, you may have difficulty moving it or using it. You may not know what position it is in without looking at it while the block is in effect.  3. For blocks in the legs and feet, returning to weight bearing and walking needs to be done carefully. You will need to wait until the numbness is entirely gone and the strength has returned. You should be able to move your leg and foot normally before you try and bear weight or walk. You will need someone to be with you when you first try to ensure you do not fall  and possibly risk injury.  4. Bruising and tenderness at the needle site are common side effects and will resolve in a few days.  5. Persistent numbness or new problems with movement should be communicated to the surgeon or the [facility] [phone] [facility] [phone]).  Next dose Tylenol can be taken at 5pm today if needed.

## 2022-07-25 NOTE — Anesthesia Preprocedure Evaluation (Addendum)
Anesthesia Evaluation  Patient identified by MRN, date of birth, ID band Patient awake    Reviewed: Allergy & Precautions, NPO status , Patient's Chart, lab work & pertinent test results  Airway Mallampati: III  TM Distance: >3 FB Neck ROM: Full    Dental  (+) Teeth Intact, Dental Advisory Given   Pulmonary    breath sounds clear to auscultation       Cardiovascular + CAD, + Past MI and + CABG  + dysrhythmias  Rhythm:Regular Rate:Normal     Neuro/Psych    GI/Hepatic   Endo/Other  diabetes    Renal/GU      Musculoskeletal   Abdominal   Peds  Hematology   Anesthesia Other Findings   Reproductive/Obstetrics                             Anesthesia Physical Anesthesia Plan  ASA: 3  Anesthesia Plan: General and Regional   Post-op Pain Management: Regional block*   Induction: Intravenous  PONV Risk Score and Plan: 2 and Ondansetron, Dexamethasone and TIVA  Airway Management Planned: Oral ETT and LMA  Additional Equipment: None  Intra-op Plan:   Post-operative Plan: Extubation in OR  Informed Consent: I have reviewed the patients History and Physical, chart, labs and discussed the procedure including the risks, benefits and alternatives for the proposed anesthesia with the patient or authorized representative who has indicated his/her understanding and acceptance.     Dental advisory given  Plan Discussed with: CRNA and Anesthesiologist  Anesthesia Plan Comments: (Discussed both nerve block for pain relief post-op and GA; including NV, sore throat, dental injury, and pulmonary complications)        Anesthesia Quick Evaluation

## 2022-07-26 ENCOUNTER — Other Ambulatory Visit: Payer: Self-pay

## 2022-07-26 ENCOUNTER — Ambulatory Visit (HOSPITAL_BASED_OUTPATIENT_CLINIC_OR_DEPARTMENT_OTHER)
Admission: RE | Admit: 2022-07-26 | Discharge: 2022-07-26 | Disposition: A | Discharge status: Home / Self Care | Payer: 59 | Attending: Orthopaedic Surgery | Admitting: Orthopaedic Surgery

## 2022-07-26 ENCOUNTER — Encounter (HOSPITAL_BASED_OUTPATIENT_CLINIC_OR_DEPARTMENT_OTHER)
Admission: RE | Disposition: A | Discharge status: Home / Self Care | Payer: Self-pay | Source: Home / Self Care | Attending: Orthopaedic Surgery

## 2022-07-26 ENCOUNTER — Ambulatory Visit (HOSPITAL_BASED_OUTPATIENT_CLINIC_OR_DEPARTMENT_OTHER): Payer: 59

## 2022-07-26 ENCOUNTER — Ambulatory Visit (HOSPITAL_BASED_OUTPATIENT_CLINIC_OR_DEPARTMENT_OTHER): Payer: 59 | Admitting: Anesthesiology

## 2022-07-26 ENCOUNTER — Encounter (HOSPITAL_BASED_OUTPATIENT_CLINIC_OR_DEPARTMENT_OTHER): Payer: Self-pay | Admitting: Orthopaedic Surgery

## 2022-07-26 DIAGNOSIS — I2510 Atherosclerotic heart disease of native coronary artery without angina pectoris: Secondary | ICD-10-CM | POA: Insufficient documentation

## 2022-07-26 DIAGNOSIS — Z7984 Long term (current) use of oral hypoglycemic drugs: Secondary | ICD-10-CM | POA: Diagnosis not present

## 2022-07-26 DIAGNOSIS — E785 Hyperlipidemia, unspecified: Secondary | ICD-10-CM | POA: Diagnosis not present

## 2022-07-26 DIAGNOSIS — X500XXA Overexertion from strenuous movement or load, initial encounter: Secondary | ICD-10-CM | POA: Diagnosis not present

## 2022-07-26 DIAGNOSIS — Z951 Presence of aortocoronary bypass graft: Secondary | ICD-10-CM

## 2022-07-26 DIAGNOSIS — S46311A Strain of muscle, fascia and tendon of triceps, right arm, initial encounter: Secondary | ICD-10-CM | POA: Diagnosis present

## 2022-07-26 DIAGNOSIS — I252 Old myocardial infarction: Secondary | ICD-10-CM | POA: Diagnosis not present

## 2022-07-26 DIAGNOSIS — E119 Type 2 diabetes mellitus without complications: Secondary | ICD-10-CM | POA: Diagnosis not present

## 2022-07-26 HISTORY — PX: TRICEPS TENDON REPAIR: SHX2577

## 2022-07-26 LAB — GLUCOSE, CAPILLARY
Glucose-Capillary: 95 mg/dL (ref 70–99)
Glucose-Capillary: 96 mg/dL (ref 70–99)

## 2022-07-26 SURGERY — REPAIR, TENDON, TRICEPS
Anesthesia: Regional | Site: Arm Upper | Laterality: Right

## 2022-07-26 MED ORDER — GABAPENTIN 300 MG PO CAPS
ORAL_CAPSULE | ORAL | Status: AC
  Filled 2022-07-26: qty 1

## 2022-07-26 MED ORDER — ROCURONIUM BROMIDE 100 MG/10ML IV SOLN
INTRAVENOUS | Status: DC | PRN
  Administered 2022-07-26: 10 mg via INTRAVENOUS

## 2022-07-26 MED ORDER — OXYCODONE HCL 5 MG PO TABS: ORAL_TABLET | ORAL | 0 refills | Status: AC

## 2022-07-26 MED ORDER — MIDAZOLAM HCL 2 MG/2ML IJ SOLN
2.0000 mg | Freq: Once | INTRAMUSCULAR | Status: AC
  Administered 2022-07-26: 2 mg via INTRAVENOUS

## 2022-07-26 MED ORDER — FENTANYL CITRATE (PF) 100 MCG/2ML IJ SOLN
INTRAMUSCULAR | Status: AC
  Filled 2022-07-26: qty 2

## 2022-07-26 MED ORDER — DEXAMETHASONE SODIUM PHOSPHATE 10 MG/ML IJ SOLN
INTRAMUSCULAR | Status: DC | PRN
  Administered 2022-07-26: 10 mg via INTRAVENOUS

## 2022-07-26 MED ORDER — FENTANYL CITRATE (PF) 100 MCG/2ML IJ SOLN
INTRAMUSCULAR | Status: DC | PRN
  Administered 2022-07-26: 100 ug via INTRAVENOUS

## 2022-07-26 MED ORDER — GABAPENTIN 100 MG PO CAPS: 100.0000 mg | ORAL_CAPSULE | Freq: Three times a day (TID) | ORAL | 0 refills | Status: AC

## 2022-07-26 MED ORDER — ACETAMINOPHEN 325 MG PO TABS: 325.0000 mg | ORAL_TABLET | ORAL | Status: DC | PRN

## 2022-07-26 MED ORDER — MEPERIDINE HCL 25 MG/ML IJ SOLN: 6.2500 mg | INTRAMUSCULAR | Status: DC | PRN

## 2022-07-26 MED ORDER — GABAPENTIN 300 MG PO CAPS
300.0000 mg | ORAL_CAPSULE | Freq: Once | ORAL | Status: AC
  Administered 2022-07-26: 300 mg via ORAL

## 2022-07-26 MED ORDER — PROPOFOL 10 MG/ML IV BOLUS
INTRAVENOUS | Status: AC
  Filled 2022-07-26: qty 20

## 2022-07-26 MED ORDER — ONDANSETRON HCL 4 MG PO TABS: 4.0000 mg | ORAL_TABLET | Freq: Three times a day (TID) | ORAL | 0 refills | Status: AC | PRN

## 2022-07-26 MED ORDER — SUCCINYLCHOLINE CHLORIDE 200 MG/10ML IV SOSY
PREFILLED_SYRINGE | INTRAVENOUS | Status: DC | PRN
  Administered 2022-07-26: 200 mg via INTRAVENOUS

## 2022-07-26 MED ORDER — ACETAMINOPHEN 500 MG PO TABS: 1000.0000 mg | ORAL_TABLET | Freq: Three times a day (TID) | ORAL | 0 refills | Status: AC

## 2022-07-26 MED ORDER — ACETAMINOPHEN 160 MG/5ML PO SOLN: 325.0000 mg | ORAL | Status: DC | PRN

## 2022-07-26 MED ORDER — ONDANSETRON HCL 4 MG/2ML IJ SOLN
INTRAMUSCULAR | Status: DC | PRN
  Administered 2022-07-26: 4 mg via INTRAVENOUS

## 2022-07-26 MED ORDER — FENTANYL CITRATE (PF) 100 MCG/2ML IJ SOLN
25.0000 ug | INTRAMUSCULAR | Status: DC | PRN
  Administered 2022-07-26: 50 ug via INTRAVENOUS

## 2022-07-26 MED ORDER — 0.9 % SODIUM CHLORIDE (POUR BTL) OPTIME
TOPICAL | Status: DC | PRN
  Administered 2022-07-26: 1000 mL

## 2022-07-26 MED ORDER — LACTATED RINGERS IV SOLN: INTRAVENOUS | Status: DC

## 2022-07-26 MED ORDER — OXYCODONE HCL 5 MG PO TABS: 5.0000 mg | ORAL_TABLET | Freq: Once | ORAL | Status: DC | PRN

## 2022-07-26 MED ORDER — FENTANYL CITRATE (PF) 100 MCG/2ML IJ SOLN
100.0000 ug | Freq: Once | INTRAMUSCULAR | Status: AC
  Administered 2022-07-26: 100 ug via INTRAVENOUS

## 2022-07-26 MED ORDER — EPHEDRINE SULFATE (PRESSORS) 50 MG/ML IJ SOLN
INTRAMUSCULAR | Status: DC | PRN
  Administered 2022-07-26: 10 mg via INTRAVENOUS

## 2022-07-26 MED ORDER — ONDANSETRON HCL 4 MG/2ML IJ SOLN: 4.0000 mg | Freq: Once | INTRAMUSCULAR | Status: DC | PRN

## 2022-07-26 MED ORDER — MIDAZOLAM HCL 2 MG/2ML IJ SOLN
INTRAMUSCULAR | Status: AC
  Filled 2022-07-26: qty 2

## 2022-07-26 MED ORDER — OXYCODONE HCL 5 MG/5ML PO SOLN: 5.0000 mg | Freq: Once | ORAL | Status: DC | PRN

## 2022-07-26 MED ORDER — LIDOCAINE 2% (20 MG/ML) 5 ML SYRINGE
INTRAMUSCULAR | Status: DC | PRN
  Administered 2022-07-26: 100 mg via INTRAVENOUS

## 2022-07-26 MED ORDER — PROPOFOL 10 MG/ML IV BOLUS
INTRAVENOUS | Status: DC | PRN
  Administered 2022-07-26: 200 mg via INTRAVENOUS

## 2022-07-26 MED ORDER — BUPIVACAINE LIPOSOME 1.3 % IJ SUSP
INTRAMUSCULAR | Status: DC | PRN
  Administered 2022-07-26: 10 mL via PERINEURAL

## 2022-07-26 MED ORDER — CEFAZOLIN IN SODIUM CHLORIDE 3-0.9 GM/100ML-% IV SOLN
3.0000 g | INTRAVENOUS | Status: AC
  Administered 2022-07-26: 3 g via INTRAVENOUS

## 2022-07-26 MED ORDER — BUPIVACAINE HCL (PF) 0.5 % IJ SOLN
INTRAMUSCULAR | Status: DC | PRN
  Administered 2022-07-26: 15 mL via PERINEURAL

## 2022-07-26 MED ORDER — ALBUTEROL SULFATE HFA 108 (90 BASE) MCG/ACT IN AERS
INHALATION_SPRAY | RESPIRATORY_TRACT | Status: DC | PRN
  Administered 2022-07-26: 2 via RESPIRATORY_TRACT

## 2022-07-26 MED ORDER — VANCOMYCIN HCL 1000 MG IV SOLR
INTRAVENOUS | Status: DC | PRN
  Administered 2022-07-26: 1000 mg via TOPICAL

## 2022-07-26 MED ORDER — EPHEDRINE 5 MG/ML INJ
INTRAVENOUS | Status: AC
  Filled 2022-07-26: qty 5

## 2022-07-26 MED ORDER — CEFAZOLIN IN SODIUM CHLORIDE 3-0.9 GM/100ML-% IV SOLN
INTRAVENOUS | Status: AC
  Filled 2022-07-26: qty 100

## 2022-07-26 MED ORDER — ACETAMINOPHEN 500 MG PO TABS
ORAL_TABLET | ORAL | Status: AC
  Filled 2022-07-26: qty 2

## 2022-07-26 MED ORDER — ACETAMINOPHEN 500 MG PO TABS
1000.0000 mg | ORAL_TABLET | Freq: Once | ORAL | Status: AC
  Administered 2022-07-26: 1000 mg via ORAL

## 2022-07-26 SURGICAL SUPPLY — 65 items
APL PRP STRL LF DISP 70% ISPRP (MISCELLANEOUS) ×1
BLADE HEX COATED 2.75 (ELECTRODE) ×1 IMPLANT
BLADE SURG 10 STRL SS (BLADE) ×1 IMPLANT
BLADE SURG 15 STRL LF DISP TIS (BLADE) ×1 IMPLANT
BLADE SURG 15 STRL SS (BLADE) ×1
BNDG CMPR 5X4 KNIT ELC UNQ LF (GAUZE/BANDAGES/DRESSINGS) ×1
BNDG ELASTIC 4INX 5YD STR LF (GAUZE/BANDAGES/DRESSINGS) ×1 IMPLANT
CHLORAPREP W/TINT 26 (MISCELLANEOUS) ×1 IMPLANT
CLSR STERI-STRIP ANTIMIC 1/2X4 (GAUZE/BANDAGES/DRESSINGS) ×1 IMPLANT
CUFF TOURN SGL QUICK 18X3 (MISCELLANEOUS) IMPLANT
CUFF TOURN SGL QUICK 24 (TOURNIQUET CUFF)
CUFF TRNQT CYL 24X4X16.5-23 (TOURNIQUET CUFF) IMPLANT
DRAPE EXTREMITY T 121X128X90 (DISPOSABLE) ×1 IMPLANT
DRAPE INCISE IOBAN 66X45 STRL (DRAPES) ×1 IMPLANT
DRAPE OEC MINIVIEW 54X84 (DRAPES) IMPLANT
DRAPE U-SHAPE 47X51 STRL (DRAPES) ×1 IMPLANT
ELECT REM PT RETURN 9FT ADLT (ELECTROSURGICAL) ×1
ELECTRODE REM PT RTRN 9FT ADLT (ELECTROSURGICAL) ×1 IMPLANT
GAUZE SPONGE 4X4 12PLY STRL (GAUZE/BANDAGES/DRESSINGS) ×1 IMPLANT
GLOVE BIO SURGEON STRL SZ 6.5 (GLOVE) ×1 IMPLANT
GLOVE BIOGEL PI IND STRL 6.5 (GLOVE) ×1 IMPLANT
GLOVE BIOGEL PI IND STRL 8 (GLOVE) ×1 IMPLANT
GLOVE ECLIPSE 8.0 STRL XLNG CF (GLOVE) ×1 IMPLANT
GOWN STRL REUS W/ TWL LRG LVL3 (GOWN DISPOSABLE) ×2 IMPLANT
GOWN STRL REUS W/TWL LRG LVL3 (GOWN DISPOSABLE) ×2
GOWN STRL REUS W/TWL XL LVL3 (GOWN DISPOSABLE) ×1 IMPLANT
GRAFT TISS 40X70 3 THK DERM (Tissue) IMPLANT
IMP SYS 2ND FIX PEEK 4.75X19.1 (Miscellaneous) ×1 IMPLANT
IMPL SYS 2ND FX PEEK 4.75X19.1 (Miscellaneous) IMPLANT
NDL SUT 6 .5 CRC .975X.05 MAYO (NEEDLE) IMPLANT
NEEDLE MAYO TAPER (NEEDLE) ×1
NS IRRIG 1000ML POUR BTL (IV SOLUTION) ×1 IMPLANT
PACK ARTHROSCOPY DSU (CUSTOM PROCEDURE TRAY) ×1 IMPLANT
PACK BASIN DAY SURGERY FS (CUSTOM PROCEDURE TRAY) ×1 IMPLANT
PAD CAST 4YDX4 CTTN HI CHSV (CAST SUPPLIES) ×1 IMPLANT
PADDING CAST COTTON 4X4 STRL (CAST SUPPLIES) ×1
PENCIL SMOKE EVACUATOR (MISCELLANEOUS) ×1 IMPLANT
RETRIEVER SUT HEWSON (MISCELLANEOUS) IMPLANT
SHEET MEDIUM DRAPE 40X70 STRL (DRAPES) ×1 IMPLANT
SLEEVE SCD COMPRESS KNEE MED (STOCKING) ×1 IMPLANT
SLING ARM FOAM STRAP LRG (SOFTGOODS) ×1 IMPLANT
SPIKE FLUID TRANSFER (MISCELLANEOUS) IMPLANT
SPLINT PLASTER CAST FAST 5X30 (CAST SUPPLIES) ×10 IMPLANT
SPONGE T-LAP 18X18 ~~LOC~~+RFID (SPONGE) ×1 IMPLANT
STAPLER VISISTAT 35W (STAPLE) IMPLANT
SUCTION TUBE FRAZIER 10FR DISP (SUCTIONS) IMPLANT
SUT FIBERWIRE #2 38 T-5 BLUE (SUTURE) ×2
SUT FIBERWIRE #5 38 CONV NDL (SUTURE)
SUT MNCRL AB 4-0 PS2 18 (SUTURE) ×1 IMPLANT
SUT VIC AB 0 CT1 27 (SUTURE) ×1
SUT VIC AB 0 CT1 27XBRD ANBCTR (SUTURE) ×1 IMPLANT
SUT VIC AB 1 CT1 27 (SUTURE)
SUT VIC AB 1 CT1 27XBRD ANBCTR (SUTURE) IMPLANT
SUT VIC AB 2-0 CT1 27 (SUTURE)
SUT VIC AB 2-0 CT1 TAPERPNT 27 (SUTURE) IMPLANT
SUT VIC AB 2-0 SH 27 (SUTURE) ×1
SUT VIC AB 2-0 SH 27XBRD (SUTURE) ×1 IMPLANT
SUT VIC AB 3-0 SH 27 (SUTURE)
SUT VIC AB 3-0 SH 27X BRD (SUTURE) IMPLANT
SUTURE FIBERWR #2 38 T-5 BLUE (SUTURE) IMPLANT
SUTURE FIBERWR #5 38 CONV NDL (SUTURE) IMPLANT
SYR BULB EAR ULCER 3OZ GRN STR (SYRINGE) ×1 IMPLANT
TISSUE ARTHOFLEX THICK 3MM (Tissue) ×1 IMPLANT
TOWEL GREEN STERILE FF (TOWEL DISPOSABLE) ×2 IMPLANT
TUBE SUCTION HIGH CAP CLEAR NV (SUCTIONS) ×1 IMPLANT

## 2022-07-26 NOTE — Op Note (Signed)
Orthopaedic Surgery Operative Note (CSN: [id number])  Roberto Morrison  1970-12-14 Date of Surgery: 07/26/2022   Diagnoses:  RIGHT TRICEPS RUPTURE  Procedure: Right triceps repair with augmentation with a dermal allograft   Operative Finding Successful completion of the planned procedure.  Patient's tendon quality was extremely poor and we reexcised the enthesophyte within the tendon.  There was significant tendinopathic tissue and I worried that his repair considering his strength and his overall size would not hold up without augmentation.  I was able to use a dermal allograft to augment the tissue and improve the fixation while avoiding damage to the ulnar nerve.  I was pleased with the final construct.  He was stable to about 30 degrees of flexion.  Post-operative plan: The patient will be nonweightbearing in a splint for a week.  The patient will be discharged home.  DVT prophylaxis aspirin secondary to patient's baseline use of aspirin.   Pain control with PRN pain medication preferring oral medicines.  Follow up plan will be scheduled in approximately 7 days for incision check and XR.  Post-Op Diagnosis: Same Surgeons:Primary: Bjorn Pippin, MD Assistants:Caroline McBane PA-C Location: [facility] OR ROOM 6 Anesthesia: General with regional anesthesia Antibiotics: Ancef 3 g with local vancomycin 1 g at the surgical site Tourniquet time: None Estimated Blood Loss: 50 mL Complications: None Specimens: None Implants: Implant Name Type Inv. Item Serial No. Manufacturer Lot No. LRB No. Used Action  IMP SYS 2ND FIX PEEK 4.75X19.1 - [id number] Miscellaneous IMP SYS 2ND FIX PEEK 4.75X19.1  [employer] [id number] Right 1 Implanted  TISSUE ARTHOFLEX THICK - [id number] Tissue TISSUE ARTHOFLEX THICK [id number] [facility] [id number] Right 1 Implanted    Indications for Surgery:   Roberto Morrison is a 52 y.o. male with distal triceps rupture with previous history of contralateral distal  triceps rupture.  Benefits and risks of operative and nonoperative management were discussed prior to surgery with patient/guardian(s) and informed consent form was completed.  Specific risks including infection, need for additional surgery, rerupture, stiffness, neurovascular damage amongst others.   Procedure:   The patient was identified properly. Informed consent was obtained and the surgical site was marked. The patient was taken up to suite where general anesthesia was induced.  The patient was positioned lateral on a beanbag.  The right elbow was prepped and draped in the usual sterile fashion.  Timeout was performed before the beginning of the case.  We began by making a incision over the posterior aspect of the triceps extending over the proximal 4 to 5 cm of the olecranon.  We are at this incision more lateral to avoid any ulnar nerve on the medial aspect of the incision.  We dissected down through skin sharply achieving hemostasis as we progressed.  Full-thickness skin flaps were raised and we identified the distal aspect of the triceps at this point.  We mobilized medially and laterally and freed the triceps tendon to the best of our ability.  We then removed and enthesophyte within the tendon.  The tendon itself was quite tendinopathic and I excised a significant amount of unhealthy tissue.   This point we proceeded with a repair.  We used high-strength nonabsorbable suture in the form of #2 FiberWire and suture tape to pass in a Krakw fashion up both the medial and lateral aspects of the tendon exiting on the deep surface of the tendon distally.  We had good purchase of the tendon itself without any concerns for cut through.  Took great care when sewing the medial side to avoid passes that would put the ulnar nerve at risk.  This point we passed a Arthrex fiber link from superficial to deep both medially and laterally at the same exit point of our Krackow sutures in the tendon.  The loops  were superficial and kept proximal to avoid pulling through.  At this point we identified the distal aspect of the olecranon and used a rondure and curette to create a bony bleeding bed for healing of the tendon.  We then used a 2 mm drill to create to extra-articular bone tunnels and shuttled sutures medially and laterally corresponding through each bone tunnel.  We then used the fiber link suture from each side to shuttle alternating limbs of her higher strength suture to create a double row type repair.  At this point we used a knotless 4.75 mm swivel lock anchor that was placed after predrilling and tapping the olecranon about 3 to 4 cm distal to the joint to tension or repair and create good compression of the triceps itself.  We had good compression of the triceps to the olecranon.   I was able to get tendon to bone however the overall construct did not feel as strong as I would hope due to the tendon quality itself.  We felt that a dermal allograft would help this.  We used a 3 x 5 cm dermal Arthro Flex allograft and tied it with interrupted FiberWire minimally and multiple multiple 0 Vicryl in a figure-of-eight fashion proximally.  We then used the safety sutures from the distal swivel lock and tied the graft into the ulna.  We tied all the borders of the graft in the central part of the graft with Vicryl.  We had a strong construct in the end of the case.  We irrigated before starting this and irrigated again after.  Incision was irrigated copiously before repair was reinforced with figure-of-eight 0 Vicryl.  Vancomycin powder was placed.  We then closed the incision in a multilayer fashion with a absorbable sutures.  A sterile dressing bulky in nature and a extension splint were placed.  The patient was awoken and taken to the cover area in stable condition.   Alfonse Alpers, PA-C, present and scrubbed throughout the case, critical for completion in a timely fashion, and for retraction,  instrumentation, closure.

## 2022-07-26 NOTE — Interval H&P Note (Signed)
All questions answered, patient wants to proceed with procedure. ? ?

## 2022-07-26 NOTE — Anesthesia Postprocedure Evaluation (Signed)
Anesthesia Post Note  Patient: Roberto Morrison  Procedure(s) Performed: TRICEPS TENDON REPAIR (Right: Arm Upper)     Patient location during evaluation: PACU Anesthesia Type: Regional and General Level of consciousness: awake and alert Pain management: pain level controlled Vital Signs Assessment: post-procedure vital signs reviewed and stable Respiratory status: spontaneous breathing, nonlabored ventilation, respiratory function stable and patient connected to nasal cannula oxygen Cardiovascular status: blood pressure returned to baseline and stable Postop Assessment: no apparent nausea or vomiting Anesthetic complications: no   No notable events documented.  Last Vitals:  Vitals:   07/26/22 1415 07/26/22 1427  BP: 114/75 126/87  Pulse: (!) 59 61  Resp: 20 20  Temp:  (!) 36.1 C  SpO2: 97% 93%    Last Pain:  Vitals:   07/26/22 1427  TempSrc:   PainSc: 4                  Mckynlie Vanderslice

## 2022-07-26 NOTE — Transfer of Care (Signed)
Immediate Anesthesia Transfer of Care Note  Patient: Roberto Morrison  Procedure(s) Performed: TRICEPS TENDON REPAIR (Right: Arm Upper)  Patient Location: PACU  Anesthesia Type:General and regional  Level of Consciousness: awake, alert , and patient cooperative  Airway & Oxygen Therapy: Patient Spontanous Breathing and Patient connected to face mask oxygen  Post-op Assessment: Report given to RN and Post -op Vital signs reviewed and stable  Post vital signs: Reviewed and stable  Last Vitals:  Vitals Value Taken Time  BP    Temp    Pulse 63 07/26/22 1338  Resp    SpO2 96 % 07/26/22 1338  Vitals shown include unvalidated device data.  Last Pain:  Vitals:   07/26/22 1037  TempSrc: Oral  PainSc: 0-No pain         Complications: No notable events documented.

## 2022-07-26 NOTE — Anesthesia Procedure Notes (Addendum)
Anesthesia Regional Block: Supraclavicular block   Pre-Anesthetic Checklist: , timeout performed,  Correct Patient, Correct Site, Correct Laterality,  Correct Procedure, Correct Position, site marked,  Risks and benefits discussed,  Surgical consent,  Pre-op evaluation,  At surgeon's request and post-op pain management  Laterality: Right  Prep: chloraprep       Needles:  Injection technique: Single-shot  Needle Type: Echogenic Stimulator Needle     Needle Length: 5cm  Needle Gauge: 22     Additional Needles:   Procedures:, nerve stimulator,,, ultrasound used (permanent image in chart),,     Nerve Stimulator or Paresthesia:  Response: hand, 0.45 mA  Additional Responses:   Narrative:  Start time: 07/26/2022 11:20 AM End time: 07/26/2022 11:24 AM Injection made incrementally with aspirations every 5 mL.  Performed by: Personally  Anesthesiologist: Bethena Midget, MD  Additional Notes: Functioning IV was confirmed and monitors were applied.  A 50mm 22ga Arrow echogenic stimulator needle was used. Sterile prep and drape,hand hygiene and sterile gloves were used. Ultrasound guidance: relevant anatomy identified, needle position confirmed, local anesthetic spread visualized around nerve(s)., vascular puncture avoided.  Image printed for medical record. Negative aspiration and negative test dose prior to incremental administration of local anesthetic. The patient tolerated the procedure well.

## 2022-07-26 NOTE — Progress Notes (Signed)
Assisted Dr. Oddono with right, supraclavicular, ultrasound guided block. Side rails up, monitors on throughout procedure. See vital signs in flow sheet. Tolerated Procedure well. 

## 2022-07-26 NOTE — Anesthesia Procedure Notes (Addendum)
Procedure Name: Intubation Date/Time: 07/26/2022 11:47 AM  Performed by: Lauralyn Primes, CRNAPre-anesthesia Checklist: Patient identified, Emergency Drugs available, Suction available and Patient being monitored Patient Re-evaluated:Patient Re-evaluated prior to induction Oxygen Delivery Method: Circle system utilized Preoxygenation: Pre-oxygenation with 100% oxygen Induction Type: IV induction and Cricoid Pressure applied Ventilation: Mask ventilation without difficulty Laryngoscope Size: Mac, 4 and Glidescope Grade View: Grade II Tube type: Oral Tube size: 7.5 mm Number of attempts: 1 Airway Equipment and Method: Stylet and Oral airway Placement Confirmation: ETT inserted through vocal cords under direct vision, positive ETCO2 and breath sounds checked- equal and bilateral Secured at: 23 cm Tube secured with: Tape Dental Injury: Teeth and Oropharynx as per pre-operative assessment

## 2022-07-27 ENCOUNTER — Encounter (HOSPITAL_BASED_OUTPATIENT_CLINIC_OR_DEPARTMENT_OTHER): Payer: Self-pay | Admitting: Orthopaedic Surgery

## 2022-10-16 ENCOUNTER — Ambulatory Visit: Payer: 59 | Attending: Genetic Counselor | Admitting: Genetic Counselor

## 2022-11-09 ENCOUNTER — Encounter: Payer: Self-pay | Admitting: Internal Medicine

## 2022-11-19 ENCOUNTER — Other Ambulatory Visit: Payer: Self-pay

## 2022-11-19 ENCOUNTER — Encounter (HOSPITAL_COMMUNITY): Payer: Self-pay

## 2022-11-19 ENCOUNTER — Emergency Department (HOSPITAL_COMMUNITY)
Admission: EM | Admit: 2022-11-19 | Discharge: 2022-11-19 | Disposition: A | Discharge status: Home / Self Care | Payer: 59 | Attending: Emergency Medicine | Admitting: Emergency Medicine

## 2022-11-19 ENCOUNTER — Emergency Department (HOSPITAL_COMMUNITY): Payer: 59

## 2022-11-19 DIAGNOSIS — Z7982 Long term (current) use of aspirin: Secondary | ICD-10-CM | POA: Insufficient documentation

## 2022-11-19 DIAGNOSIS — R202 Paresthesia of skin: Secondary | ICD-10-CM | POA: Diagnosis present

## 2022-11-19 DIAGNOSIS — I2510 Atherosclerotic heart disease of native coronary artery without angina pectoris: Secondary | ICD-10-CM | POA: Insufficient documentation

## 2022-11-19 DIAGNOSIS — Z951 Presence of aortocoronary bypass graft: Secondary | ICD-10-CM | POA: Diagnosis not present

## 2022-11-19 LAB — BASIC METABOLIC PANEL
Anion gap: 13 (ref 5–15)
BUN: 13 mg/dL (ref 6–20)
CO2: 24 mmol/L (ref 22–32)
Calcium: 9.3 mg/dL (ref 8.9–10.3)
Chloride: 103 mmol/L (ref 98–111)
Creatinine, Ser: 1.4 mg/dL — ABNORMAL HIGH (ref 0.61–1.24)
GFR, Estimated: >60 mL/min (ref >60)
Glucose, Bld: 122 mg/dL — ABNORMAL HIGH (ref 70–99)
Potassium: 3.9 mmol/L (ref 3.5–5.1)
Sodium: 140 mmol/L (ref 135–145)

## 2022-11-19 LAB — CBC
HCT: 50.4 % (ref 39.0–52.0)
Hemoglobin: 17.1 g/dL — ABNORMAL HIGH (ref 13.0–17.0)
MCH: 31.1 pg (ref 26.0–34.0)
MCHC: 33.9 g/dL (ref 30.0–36.0)
MCV: 91.6 fL (ref 80.0–100.0)
Platelets: 175 10*3/uL (ref 150–400)
RBC: 5.5 MIL/uL (ref 4.22–5.81)
RDW: 14.8 % (ref 11.5–15.5)
WBC: 5.5 10*3/uL (ref 4.0–10.5)
nRBC: 0 % (ref 0.0–0.2)

## 2022-11-19 LAB — TROPONIN I (HIGH SENSITIVITY)
Troponin I (High Sensitivity): 9 ng/L (ref <18)
Troponin I (High Sensitivity): 13 ng/L (ref <18)

## 2022-11-19 NOTE — ED Triage Notes (Addendum)
Pt complaining of tingling in his left arm this morning when he woke up.  Denies any chest pain but he has had open heart surgery last year. He wants to make sure that he is not having a heart attack.

## 2022-11-19 NOTE — ED Provider Notes (Signed)
[place] EMERGENCY DEPARTMENT AT [facility] Provider Note   CSN: [id number] Arrival date & time: 11/19/22  4098     History  Chief Complaint  Patient presents with   Numbness    Roberto Morrison is a 52 y.o. male.  Patient is a 52 year old male with a past medical history of CAD status post CABG presenting to the emergency department with paresthesias in his left arm.  The patient states that one of his friends was recently admitted to the hospital requiring a CABG and had complications with a stroke and this is and making him feel more anxious recently.  He states that when he woke up this morning his left arm was tingling like it was asleep and he was concerned it could be related to his heart.  He states that now only his fingertips are tingling.  He denies any pain in his arm or his chest, shortness of breath, nausea, vomiting or lower extremity swelling.  He states he has been taking his medications as prescribed.  He denies any associated weakness or tingling in his leg or face.  The history is provided by the patient.       Home Medications Prior to Admission medications   Medication Sig Start Date End Date Taking? Authorizing Provider  aspirin EC 81 MG EC tablet Take 1 tablet (81 mg total) by mouth daily. Swallow whole. 07/31/20   Leary Roca, PA-C  atorvastatin (LIPITOR) 80 MG tablet TAKE 1 TABLET(80 MG) BY MOUTH DAILY 07/05/22   Runell Gess, MD  carvedilol (COREG) 6.25 MG tablet TAKE 1 TABLET(6.25 MG) BY MOUTH TWICE DAILY 01/24/22   Chandrasekhar, Mahesh A, MD  empagliflozin (JARDIANCE) 10 MG TABS tablet Take 1 tablet (10 mg total) by mouth daily. 07/05/22   Christell Constant, MD  losartan (COZAAR) 50 MG tablet TAKE 1 TABLET(50 MG) BY MOUTH DAILY 04/10/22   Runell Gess, MD  traZODone (DESYREL) 50 MG tablet Take 1-2 tablets (50-100 mg total) by mouth at bedtime as needed for sleep. 06/25/22   Christen Butter, NP  valACYclovir (VALTREX) 500 MG  tablet Take 500 mg by mouth as needed. Per Patient only takes once a year 09/07/21   [provider]      Allergies    Prednisone    Review of Systems   Review of Systems  Physical Exam Updated Vital Signs BP (!) 124/92   Pulse 67   Temp 98 F (36.7 C)   Resp 19   Ht 6' (1.829 m)   Wt 121.1 kg   SpO2 100%   BMI 36.21 kg/m  Physical Exam Vitals and nursing note reviewed.  Constitutional:      General: He is not in acute distress.    Appearance: Normal appearance.  HENT:     Head: Normocephalic and atraumatic.     Nose: Nose normal.     Mouth/Throat:     Mouth: Mucous membranes are moist.     Pharynx: Oropharynx is clear.  Eyes:     Extraocular Movements: Extraocular movements intact.     Conjunctiva/sclera: Conjunctivae normal.  Cardiovascular:     Rate and Rhythm: Normal rate and regular rhythm.     Heart sounds: Normal heart sounds.  Pulmonary:     Effort: Pulmonary effort is normal.     Breath sounds: Normal breath sounds.  Abdominal:     General: Abdomen is flat.     Palpations: Abdomen is soft.  Tenderness: There is no abdominal tenderness.  Musculoskeletal:        General: Normal range of motion.     Cervical back: Normal range of motion.  Skin:    General: Skin is warm and dry.  Neurological:     General: No focal deficit present.     Mental Status: He is alert and oriented to person, place, and time.     Sensory: No sensory deficit.     Motor: No weakness.  Psychiatric:        Mood and Affect: Mood normal.        Behavior: Behavior normal.     ED Results / Procedures / Treatments   Labs (all labs ordered are listed, but only abnormal results are displayed) Labs Reviewed  BASIC METABOLIC PANEL - Abnormal; Notable for the following components:      Result Value   Glucose, Bld 122 (*)    Creatinine, Ser 1.40 (*)    All other components within normal limits  CBC - Abnormal; Notable for the following components:   Hemoglobin 17.1 (*)     All other components within normal limits  TROPONIN I (HIGH SENSITIVITY)  TROPONIN I (HIGH SENSITIVITY)    EKG EKG Interpretation Date/Time:  Monday November 19 2022 04:57:31 EDT Ventricular Rate:  80 PR Interval:  164 QRS Duration:  122 QT Interval:  402 QTC Calculation: 463 R Axis:   -51  Text Interpretation: Normal sinus rhythm RSR' or QR pattern in V1 suggests right ventricular conduction delay Left anterior fascicular block Minimal voltage criteria for LVH, may be normal variant ( Cornell product ) Abnormal ECG No significant change since last tracing Confirmed by Elayne Snare ([id number]) on 11/19/2022 8:05:21 AM  Radiology DG Chest 2 View  Result Date: 11/19/2022 CLINICAL DATA:  Provided history: Chest tightness. Additional history provided: Tingling in left arm upon awakening. EXAM: CHEST - 2 VIEW COMPARISON:  Prior chest radiographs 01/29/2022 and earlier. FINDINGS: Prior median sternotomy/CABG. Heart size within normal limits. No appreciable airspace consolidation or pulmonary edema. No evidence of pleural effusion or pneumothorax. No acute osseous abnormality identified. IMPRESSION: No evidence of an acute cardiopulmonary abnormality. Electronically Signed   By: Jackey Loge D.O.   On: 11/19/2022 07:05    Procedures Procedures    Medications Ordered in ED Medications - No data to display  ED Course/ Medical Decision Making/ A&P Clinical Course as of 11/19/22 0946  Mon Nov 19, 2022  0920 Repeat troponin negative. Patient is stable for discharge home. Does have cardiology follow up scheduled for next month. He is stable for discharge home and was given strict return precautions. [VK]    Clinical Course User Index [VK] Rexford Maus, DO                                 Medical Decision Making This patient presents to the ED with chief complaint(s) of arm paresthesias with pertinent past medical history of CAD status post CABG which further complicates the  presenting complaint. The complaint involves an extensive differential diagnosis and also carries with it a high risk of complications and morbidity.    The differential diagnosis includes referral neuropathy, with only inclusion of the arm and no associated weakness and symptoms now resolved making a CVA, TIA, ICH or mass effect unlikely, no associated chest or arm pain making ACS or dissection less likely, considering electrolyte abnormality  Additional history obtained:  Additional history obtained from N/A Records reviewed outpatient cardiology records  ED Course and Reassessment: On patient's arrival he is hemodynamically stable in no acute distress.  Was initially evaluated by triage and had EKG, labs and chest x-ray performed.  EKG showed normal sinus rhythm without acute ischemic changes.  Labs are at baseline including initial normal troponin, chest x-ray is without acute disease.  He has no neurologic deficits on exam.  With symptoms starting just prior to arrival will need repeat troponin.  He will be closely reassessed.  Independent labs interpretation:  The following labs were independently interpreted: at baseline/within normal range  Independent visualization of imaging: - I independently visualized the following imaging with scope of interpretation limited to determining acute life threatening conditions related to emergency care: CXR, which revealed no acute disease  Consultation: - Consulted or discussed management/test interpretation w/ external professional: N/A  Consideration for admission or further workup: Patient has no emergent conditions requiring admission or further work-up at this time and is stable for discharge home with primary care follow-up  Social Determinants of health: N/A    Amount and/or Complexity of Data Reviewed Labs: ordered. Radiology: ordered.          Final Clinical Impression(s) / ED Diagnoses Final diagnoses:  Paresthesia    Rx  / DC Orders ED Discharge Orders     None         Rexford Maus, DO 11/19/22 [phone]

## 2022-11-19 NOTE — Discharge Instructions (Signed)
You were seen in the emergency department for your arm tingling.  Your workup showed no signs of heart attack or stroke.  It is possible that you could have compressed one of your nerves so you are sleeping however you should follow-up with your primary doctor and your cardiologist as planned to have your symptoms rechecked.  You should return to the emergency department if you have numbness that involves the entire left side of your body, you have associated weakness, you have chest pain or arm pain or any other new or concerning symptoms.

## 2022-12-10 NOTE — Progress Notes (Deleted)
Cardiology Office Note:    Date:  12/10/2022   ID:  Roberto Morrison, DOB 03-13-1970, MRN [id number]  PCP:  Roberto Butter, NP   [facility] Providers Cardiologist:  Roberto Batty, MD     Referring MD: Roberto Butter, NP   CC:HCM f/u  History of Present Illness:    Roberto Morrison is a 52 y.o. male with a hx CAD s/p CABG LIMA to the LAD, pedicle RIMA to the PDA and a left radial to the obtuse marginal branch. Ischemic cardiomyopathy. Thought his imaging found to have elevated LV thickening with VL dilation.  Seen for evaluation of HCM vs Athlete's Heart. 2023: unable to confirm HCM was not amenable to deconditioning.  No ectopy on subsequent testing; no family history of HCM.  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 2024: no further competitions. He has skin surgery under [place] with no issues.  He had a ruptured triceps and is pending surgery. *** Heart monitor not performed   Patient notes that (s)he is doing ***.   Since last visit notes *** . There are no*** interval hospital/ED visit.   EKG showed ***  No chest pain or pressure ***.  No SOB/DOE*** and no PND/Orthopnea***.  No weight gain or leg swelling***.  No palpitations or syncope ***.  Ambulatory blood pressure ***.    Past Medical History:  Diagnosis Date   Diabetes mellitus without complication (HCC)    Hyperlipidemia    Myocardial infarction (HCC) 07/26/2020   NSTEMI 07/26/2020    Past Surgical History:  Procedure Laterality Date   CORONARY ARTERY BYPASS GRAFT N/A 07/26/2020   Procedure: CORONARY ARTERY BYPASS GRAFTING (CABG) X THREE, USING LEFT INTERNAL MAMMARY ARTERY< RIGHT INTERNAL MAMMARY ARTERY AND LEFT RADIAL ARTERY CONDUITS.;  Surgeon: Roberto Dolin, MD;  Location: [facility] OR;  Service: Open Heart Surgery;  Laterality: N/A;   INTERCOSTAL NERVE BLOCK  07/26/2020   Procedure: INTERCOSTAL NERVE BLOCK;  Surgeon: Roberto Dolin, MD;  Location: [facility] OR;  Service: Open Heart Surgery;;   LEFT HEART CATH AND CORONARY ANGIOGRAPHY  N/A 07/22/2020   Procedure: LEFT HEART CATH AND CORONARY ANGIOGRAPHY;  Surgeon: Roberto Gess, MD;  Location: [facility] INVASIVE CV LAB;  Service: Cardiovascular;  Laterality: N/A;   RADIAL ARTERY HARVEST Left 07/26/2020   Procedure: RADIAL ARTERY HARVEST;  Surgeon: Roberto Dolin, MD;  Location: [facility] OR;  Service: Open Heart Surgery;  Laterality: Left;   TEE WITHOUT CARDIOVERSION N/A 07/26/2020   Procedure: TRANSESOPHAGEAL ECHOCARDIOGRAM (TEE);  Surgeon: Roberto Dolin, MD;  Location[facility];  Service: Open Heart Surgery;  Laterality: N/A;   TRICEPS TENDON REPAIR Left    years ago   TRICEPS TENDON REPAIR Right 07/26/2022   Procedure: TRICEPS TENDON REPAIR;  Surgeon: Roberto Pippin, MD;  Location: [facility];  Service: Orthopedics;  Laterality: Right;    Current Medications: No outpatient medications have been marked as taking for the 12/12/22 encounter (Appointment) with Christell Constant, MD.     Allergies:   Prednisone   Social History   Socioeconomic History   Marital status: Single    Spouse name: Not on file   Number of children: Not on file   Years of education: Not on file   Highest education level: Not on file  Occupational History   Not on file  Tobacco Use   Smoking status: Never   Smokeless tobacco: Never  Substance and Sexual Activity   Alcohol use: Not Currently   Drug use:  Never   Sexual activity: Not on file  Other Topics Concern   Not on file  Social History Narrative   Not on file   Social Determinants of Health   Financial Resource Strain: Not on file  Food Insecurity: No Food Insecurity (03/02/2020)   Received [facility], [facility]   Hunger Vital Sign    Worried About Running Out of Food in the Last Year: Never true    Ran Out of Food in the Last Year: Never true  Transportation Needs: Not on file  Physical Activity: Not on file  Stress: Not on file  Social Connections: Unknown (06/18/2021)   Received from [facility], [facility]   Social Network    Social Network: Not on file    Social: [employer] [place] in the past.  He recently won [employer] [place] (2023)  ROS:   Please see the history of present illness.      EKGs/Labs/Other Studies Reviewed:    The following studies were reviewed today:  EKG: 3/17/23SR LAFB  Cardiac Studies & Procedures   CARDIAC CATHETERIZATION  CARDIAC CATHETERIZATION 07/22/2020  Narrative Images from the original result were not included.   Prox LAD to Mid LAD lesion is 90% stenosed.  Prox Cx to Mid Cx lesion is 90% stenosed.  Mid RCA lesion is 50% stenosed.  Roberto Morrison is a 52 y.o. male   [id number] LOCATION:  FACILITY: [facility] PHYSICIAN: Roberto Morrison, M.D. 1970/08/10   DATE OF PROCEDURE:  07/22/2020  DATE OF DISCHARGE:     CARDIAC CATHETERIZATION    History obtained from chart review.52 y.o. male with no PMH who presented with chest pain after being at the gym and noted to have NSTEMI.  Impression Mr. Nicoson was admitted with a non-STEMI.  He has three-vessel disease.  He has a long segmental mid LAD lesion and a moderately long mid AV groove circumflex lesion lesion with what appears to be a moderate though not significant distal RCA lesion.  His filling pressures were normal.  His EF looked preserved.  I believe he would be best served with coronary artery bypass grafting with bilateral IMA's.  Surgical consult has been called.  The sheath was removed and a TR band was placed on the right wrist to achieve patent hemostasis.  The patient left the lab in stable condition.  Plan will be to restart IV heparin 4 hours after sheath removal.  Roberto Morrison. MD, [facility] 07/22/2020 4:11 PM  Findings Coronary Findings Diagnostic  Dominance: Right  Left Anterior Descending Prox LAD to Mid LAD lesion is 90% stenosed.  Left Circumflex Prox Cx to Mid Cx lesion is 90% stenosed.  Right Coronary Artery Mid RCA lesion is 50%  stenosed.  Intervention  No interventions have been documented.   STRESS TESTS  EXERCISE TOLERANCE TEST (ETT) 07/11/2021  Interpretation Summary   The ECG was negative for ischemia.   No ST deviation was noted.   Exercise capacity was excellent. Stage 4 was reached after exercising for 11 min and 39 sec. Maximum HR of 145 bpm. MPHR 85.0 %. Peak METS 13.7 .   Hypertensive blood pressure and normal heart rate response noted during stress.   ECHOCARDIOGRAM  ECHOCARDIOGRAM COMPLETE 05/01/2021  Narrative ECHOCARDIOGRAM REPORT    Patient Name:   Roberto Morrison Date of Exam: 05/01/2021 Medical Rec #:  [id number]     Height:       72.0 in Accession #:    [id number]  Weight:       284.0 lb Date of Birth:  Apr 21, 1970      BSA:          2.473 m Patient Age:    51 years      BP:           126/90 mmHg Patient Gender: M             HR:           63 bpm. Exam Location:  [address]  Procedure: 2D Echo, 3D Echo, Cardiac Doppler, Color Doppler and Strain Analysis  Indications:    I42 Asymmetric septal hypertrophy  History:        Patient has prior history of Echocardiogram examinations, most recent 10/18/2020. Previous Myocardial Infarction, Prior CABG; Risk Factors:Dyslipidemia. ASH.  Sonographer:    Jorje Guild BS, RDCS Referring Phys: [phone] JONATHAN J BERRY  IMPRESSIONS   1. Left ventricular ejection fraction, by estimation, is 50 to 55%. The left ventricle has low normal function. The left ventricle demonstrates regional wall motion abnormalities (see scoring diagram/findings for description). All basal-to-mid septal segments are hypokinetic.There is severe asymmetric left ventricular hypertrophy of the basal-septal segment. Left ventricular diastolic parameters were normal. The average left ventricular global longitudinal strain is -20.4 %. The global longitudinal strain is normal. 2. Right ventricular systolic function is mildly reduced. The right ventricular size is normal. 3. The  mitral valve is normal in structure. Mild mitral valve regurgitation. 4. The aortic valve is tricuspid. There is mild thickening of the aortic valve. Aortic valve regurgitation is mild. Aortic valve sclerosis is present, with no evidence of aortic valve stenosis. 5. Aortic dilatation noted. There is mild dilatation of the aortic root, measuring 39 mm. There is mild dilatation of the ascending aorta, measuring 37 mm.  Comparison(s): Compared to prior TTE on 10/18/20, the LVEF has improved to 50-55% (previously EF 40-45%). Otherwise, there is no significant change.  FINDINGS Left Ventricle: Left ventricular ejection fraction, by estimation, is 50 to 55%. Left ventricular ejection fraction by 3D volume is 54 %. The left ventricle has low normal function. The left ventricle demonstrates regional wall motion abnormalities. All basal-to-mid septal segments are hypokinetic. The average left ventricular global longitudinal strain is -20.4 %. The global longitudinal strain is normal. The left ventricular internal cavity size was normal in size. There is severe asymmetric left ventricular hypertrophy of the basal-septal segment. Abnormal (paradoxical) septal motion consistent with post-operative status. Left ventricular diastolic parameters were normal.  Right Ventricle: The right ventricular size is normal. No increase in right ventricular wall thickness. Right ventricular systolic function is mildly reduced.  Left Atrium: Left atrial size was normal in size.  Right Atrium: Right atrial size was normal in size.  Pericardium: There is no evidence of pericardial effusion.  Mitral Valve: The mitral valve is normal in structure. There is mild thickening of the mitral valve leaflet(s). Mild mitral valve regurgitation.  Tricuspid Valve: The tricuspid valve is normal in structure. Tricuspid valve regurgitation is trivial.  Aortic Valve: The aortic valve is tricuspid. There is mild thickening of the aortic  valve. Aortic valve regurgitation is mild. Aortic valve sclerosis is present, with no evidence of aortic valve stenosis.  Pulmonic Valve: The pulmonic valve was normal in structure. Pulmonic valve regurgitation is trivial.  Aorta: Aortic dilatation noted. There is mild dilatation of the aortic root, measuring 39 mm. There is mild dilatation of the ascending aorta, measuring 37 mm.  IAS/Shunts: No atrial level  shunt detected by color flow Doppler.   LEFT VENTRICLE PLAX 2D LVIDd:         5.90 cm         Diastology LVIDs:         4.20 cm         LV e' medial:    8.10 cm/s LV PW:         1.00 cm         LV E/e' medial:  10.2 LV IVS:        1.10 cm         LV e' lateral:   18.20 cm/s LVOT diam:     2.60 cm         LV E/e' lateral: 4.5 LV SV:         124 LV SV Index:   50              2D LVOT Area:     5.31 cm        Longitudinal Strain 2D Strain GLS  -17.2 % (A2C): 2D Strain GLS  -22.6 % (A3C): 2D Strain GLS  -21.3 % (A4C): 2D Strain GLS  -20.4 % Avg:  3D Volume EF LV 3D EF:    Left ventricul ar ejection fraction by 3D volume is 54 %.  3D Volume EF: 3D EF:        54 % LV EDV:       200 ml LV ESV:       92 ml LV SV:        108 ml  RIGHT VENTRICLE            IVC RV Basal diam:  4.20 cm    IVC diam: 1.60 cm RV S prime:     7.80 cm/s TAPSE (M-mode): 1.5 cm  LEFT ATRIUM             Index        RIGHT ATRIUM           Index LA diam:        5.30 cm 2.14 cm/m   RA Pressure: 3.00 mmHg LA Vol (A2C):   61.6 ml 24.91 ml/m  RA Area:     12.50 cm LA Vol (A4C):   38.5 ml 15.57 ml/m  RA Volume:   26.10 ml  10.55 ml/m LA Biplane Vol: 49.7 ml 20.10 ml/m AORTIC VALVE LVOT Vmax:   119.00 cm/s LVOT Vmean:  73.900 cm/s LVOT VTI:    0.233 m  AORTA Ao Root diam: 3.90 cm Ao Asc diam:  3.70 cm  MITRAL VALVE               TRICUSPID VALVE Estimated RAP:  3.00 mmHg MV Decel Time: 225 msec MV E velocity: 82.30 cm/s  SHUNTS MV A velocity: 66.50 cm/s  Systemic VTI:  0.23 m MV  E/A ratio:  1.24        Systemic Diam: 2.60 cm  Laurance Flatten MD Electronically signed by Laurance Flatten MD Signature Date/Time: 05/01/2021/10:49:46 AM    Final   TEE  ECHO INTRAOPERATIVE TEE 07/26/2020  Narrative *INTRAOPERATIVE TRANSESOPHAGEAL REPORT *    Patient Name:   Roberto Morrison  Date of Exam: 07/26/2020 Medical Rec #:  [id number]      Height:       72.0 in Accession #:    [id number]     Weight:       280.6 lb Date of Birth:  [birth year]  BSA:          2.46 m Patient Age:    50 years       BP:           135/85 mmHg Patient Gender: M              HR:           73 bpm. Exam Location:  Anesthesiology  Transesophogeal exam was perform intraoperatively during surgical procedure. Patient was closely monitored under general anesthesia during the entirety of examination.  Indications:     Coronary Artery Disease Performing Phys: [id number] Z ATKINS Diagnosing Phys: Kipp Brood MD  Complications: No known complications during this procedure. PRE-OP FINDINGS Left Ventricle: The LV end-diastolic diameter was enlarged and measured 6.3 cm at the mid-papillary level in the trans-gastric short axis view. There was normal LV wall thicness and normal LV systolic function with the ejection fraction calculated at 55-60% using the 3DQ software. There were no regional wall motion abnormalities.  On the post-bypass view there was mild to moderate global LV dysfunction without regional wall motion abnormalities. The ejection fraction was calculated at 40-45% using the 2D Simpson's method.   Right Ventricle: The right ventricle has normal systolic function. The cavity was normal. There is no increase in right ventricular wall thickness. The right ventricle was normal in size with normal systolic function.  On the post-bypass exam, the RV cavity was enlarged from the pre-bypass and there was mild-moderate RV systolic dysfunction.  Left Atrium: The left atrium was enlarged  and measured 4.4 cm in diameter in the medial-lateral dimension. The left atrial appendage is well visualized and there is no evidence of thrombus present.  Right Atrium: Right atrial size was normal in size.  Interatrial Septum: No atrial level shunt detected by color flow Doppler. There is no evidence of a patent foramen ovale.  Pericardium: There is no evidence of pericardial effusion.  Mitral Valve: The mitral valve is normal in structure. Mitral valve regurgitation is trivial by color flow Doppler. There is No evidence of mitral stenosis.  Tricuspid Valve: The tricuspid valve was normal in structure. Tricuspid valve regurgitation is trivial by color flow Doppler. No evidence of tricuspid stenosis is present.  Aortic Valve: The aortic valve is tricuspid Aortic valve regurgitation is mild by color flow Doppler. The jet is centrally-directed. There is no stenosis of the aortic valve.   Pulmonic Valve: The pulmonic valve was normal in structure, with normal. No evidence of pulmonic stenosis. Pulmonic valve regurgitation is trivial by color flow Doppler.   Aorta: The aortic root and ascending aorta are normal in size and structure. The aortic root and proximal ascending aorta were normal in contour and diameter with normal wall thickness with no evidence of atherosclerotic disease.  The descending aorta was normal in diameter and free of atherosclerotic disease.  Aortic root: 3.4 cm Sinotubular junction: 2.5 cm Proximal ascending aorta: 3.3 cm Descending thoracic aorta: 2.8 cm.  Shunts: There is no evidence of an atrial septal defect.   Kipp Brood MD Electronically signed by Kipp Brood MD Signature Date/Time: 07/26/2020/5:18:27 PM    Final   MONITORS  LONG TERM MONITOR (3-14 DAYS) 07/13/2021  Narrative  Patient had a minimum heart rate of 30 bpm, maximum heart rate of 151 bpm, and average heart rate of 75 bpm.  Predominant underlying rhythm was sinus rhythm.   Isolated PACs were rare (<1.0%).  Isolated PVCs were rare (<1.0%).  One episode of Wenckebach  heart block, asymptomatic, ~1030 AM.  Triggered and diary events associated with sinus rhythm.  No NSVT in the setting of ventricular hypertrophy.    CARDIAC MRI  MR CARDIAC MORPHOLOGY W WO CONTRAST 05/22/2021  Narrative CLINICAL DATA:  Clinical question of hypertrophic cardiomyopathy Study assumes HCT of 50 and BSA of 2.56 m2.  EXAM: CARDIAC MRI  TECHNIQUE: The patient was scanned on a 1.5 Tesla GE magnet. A dedicated cardiac coil was used. Functional imaging was done using Fiesta sequences. 2,3, and 4 chamber views were done to assess for RWMA's. Modified Simpson's rule using a short axis stack was used to calculate an ejection fraction on a dedicated work [employer]. The patient received 10 cc of Gadavist. After 10 minutes inversion recovery sequences were used to assess for infiltration and scar tissue.  CONTRAST:  10 cc  of Gadavist  FINDINGS: 1. Mild left ventricular dilation, with LVEDD 66 mm, but LVEDVi 96 mL/m2.  Asymmetric septal hypertrophy, with intraventricular septal thickness of 17 mm, posterior wall thickness of 8 mm, and myocardial mass index of 92 g/m2, mildly elevated.  Moderately reduced left ventricular systolic function (LVEF =39%). Apical septal hypokinesis, mid septal hypokinesis, basal septal hypokinesis. There is no evidence of systolic anterior motion of the mitral valve or of LVOT Obstruction.  Left ventricular parametric mapping notable for normal native T1, T2 signal.  There is no late gadolinium enhancement in the left ventricular myocardium.  2. Normal right ventricular size with RVEDVI 54 mL/m2.  Normal right ventricular thickness.  Normal right ventricular systolic function (RVEF =48%). There are no regional wall motion abnormalities or aneurysms.  3.  Normal left and right atrial size.  4. Normal size of the  aortic root, ascending aorta and pulmonary artery.  5. Valve assessment:  Aortic Valve: Tri-leaflet. Qualitatively mild central regurgitation.  Pulmonic Valve: Qualitatively, there is no significant regurgitation.  Tricuspid Valve: Qualitatively mild central regurgitation.  Mitral Valve: Qualitatively, there is no significant regurgitation.  6.  Normal pericardium.  No pericardial effusion.  7. Grossly, no extracardiac findings. Prior median sternotomy scar noted with associated artifact. Recommended dedicated study if concerned for non-cardiac pathology.  8. Wrap around and breath hold artifacts noted. This decreased the sensitivity of volumetric evaluation of valve disease.  IMPRESSION: No evidence of infiltrative disease.  In lieu of system disease, study would be consistent with hypertrophic cardiomyopathy.  Because of wall motion related to prior ischemia, cannot use strain imaging to differentiate athlete's heart hypertrophy from pathogenic hypertrophy.  Riley Lam MD   Electronically Signed By: Riley Lam M.D. On: 05/22/2021 13:07          Recent Labs: 12/27/2021: Magnesium 2.0; NT-Pro BNP 77 06/25/2022: ALT 53 11/19/2022: BUN 13; Creatinine, Ser 1.40; Hemoglobin 17.1; Platelets 175; Potassium 3.9; Sodium 140  Recent Lipid Panel    Component Value Date/Time   CHOL 126 06/25/2022 1453   CHOL 113 09/12/2020 0816   TRIG 211 (H) 06/25/2022 1453   HDL 37 (L) 06/25/2022 1453   HDL 33 (L) 09/12/2020 0816   CHOLHDL 3.4 06/25/2022 1453   VLDL 14 07/25/2020 0928   LDLCALC 61 06/25/2022 1453        Physical Exam:    VS:  There were no vitals taken for this visit.    Wt Readings from Last 3 Encounters:  11/19/22 267 lb (121.1 kg)  07/26/22 267 lb 13.7 oz (121.5 kg)  07/24/22 269 lb 6.4 oz (122.2 kg)    Gen:  no distress    Neck: No JVD  Cardiac: No Rubs or Gallops, no murmur, RRR +2 Right radial pulse, L radial scar  Respiratory:  Clear to auscultation bilaterally, normal effort, normal  respiratory rate GI: Soft, nontender, non-distended  MS: No  edema;  moves all extremities Integument: Skin feels warm Neuro:  At time of evaluation, alert and oriented to person/place/time/situation  Psych: Normal affect, patient feels well  ASSESSMENT:    No diagnosis found.  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  He has not been in support of this, we will screen him yearly looking for ectopy; he had Mobitz Type 1 HB; getting Zio patch*** - SCD risk  is increased in the setting of LVEF < 50%; potential Athlete's Heart, inability to confirm with strain; unamenable to decondition. He has had no NSVT - we have discussed that if he has syncope we will need to rediscussed ICD; we have shown him a subcutaneous ICD today *** - I recommend genetic testing as this would help [place] in clarifying his diagnosis; based on this we would consider having him meet Dr. Karrie Doffing for risk consultation. ***  ***  CAD s/p CABG (all arterial) will stop plavix and continue ASA - Mild AI next echo in 2026 - continue ASA and statin   Keep November Apt          Medication Adjustments/Labs and Tests Ordered: Current medicines are reviewed at length with the patient today.  Concerns regarding medicines are outlined above.  No orders of the defined types were placed in this encounter.  No orders of the defined types were placed in this encounter.   There are no Patient Instructions on file for this visit.   Signed, Christell Constant, MD  12/10/2022 9:50 AM    [facility]

## 2022-12-12 ENCOUNTER — Ambulatory Visit: Payer: 59 | Admitting: Internal Medicine

## 2023-01-17 ENCOUNTER — Encounter: Payer: Self-pay | Admitting: Physician Assistant

## 2023-01-17 NOTE — Progress Notes (Addendum)
Cardiology Office Note    Date:  01/18/2023  ID:  Roberto Morrison, DOB 04-13-1970, MRN [id number] PCP:  Christen Butter, NP  Cardiologist:  Christell Constant, MD  Electrophysiologist:  None   Chief Complaint: f/u CAD, HCM  History of Present Illness: .    Roberto Morrison is a 52 y.o. male with visit-pertinent history of CAD NSTEMI s/p CABG 07/2020, LVH, DM, elevated Hgb, elevated LFTs, HLD, CKD stage 2 by labs seen for follow-up.   At time of initial NSTEMI 07/2020, LVEF was 30-35%, subsequently underwent CABG. F/u echoes demonstrated gradual improvement. Last 2D echo 04/2021 EF 50-55%, basal-to-mid-septal hypkinesis, severe asymmetric LVH of basal septal segment, mild MR/AI, mild RVSF reduction, mild dilation of aortic root/ascending aorta. cMRI for LVH 05/2021 showed no infiltrative disease, study c/w hypertrophic cardiomyopathy, because of wall motion related to prior ischemia, could not use strain imaging to differentiate athlete's heart hypertrophy from pathogenic hypertrophy. He self-referred to switch to Dr. Izora Ribas. The patient sees a hormone provider in [place] as outlined in last note. 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 Zio in 2023 showed rare PACs/PVCs,one episode of Wenckebach, felt benign. ETT 07/2021 was negative for ischemia or arrhythmia. Per Dr. Debby Bud last note 07/2022, "we discussed in the past deconditioning and re-imaging. He has not been in support of this, we will screen him yearly looking for ectopy; he had Mobitz Type 1 HB; getting Zio patch. SCD risk  is increased in the setting of LVEF < 50%; potential Athlete's Heart, inability to confirm with strain; unamenable to decondition. He has had no NSVT. We have discussed that if he has syncope we will need to rediscuss ICD; we have shown him a subcutaneous ICD today. I recommend genetic testing as this would help [place] in clarifying his diagnosis; based on this we  would consider having him meet Dr. Karrie Doffing for risk consultation." Repeat Zio was ordered but still listed as exam begun under 07/2022. ED visit 11/2022 for parasthesia reviewed, troponins negative.  He returns for follow-up reporting he is doing well without any angina, dyspnea, palpitations, syncope, or edema. He feels rare twinge of an odd sensation that he wondered might be a palpitation but nothing sustained or exertional. He did have to slow down a bit this last year due to tearing his tendon in his arm. He continues to follow with his "hormone guy" in [place]. Per his report, this provider is aware of his history of CABG and HCM. The patient reports he wore the monitor this summer and mailed it back. Per our monitor team, the Zio was not activated. He is not interested in pursuing genetic testing at this time. He does not have children and states his only brother would not go to the doctor even if you put a gun to his head.   Labwork independently reviewed: Labwork brought in by patient 01/01/23, LDL 64, trig 78, Cr 1.40, AST 52, ALT 47, TSH wnl 11/2022 17.1, plt 175, K 3.9, Cr 1.40 06/2022 trig 211, LDL 61, AST ALT 42/53 01/2022 trops neg  ROS: .    Please see the history of present illness. All other systems are reviewed and otherwise negative.  Studies Reviewed: Marland Kitchen    EKG:  EKG is ordered today, personally reviewed, demonstrating NSR 77bpm, left axis deviation, NSIVCD, no acute change from prior   CV Studies: Cardiac studies reviewed are outlined and summarized above. Otherwise please see EMR for full  report.   Current Reported Medications:.    Current Meds  Medication Sig   aspirin EC 81 MG EC tablet Take 1 tablet (81 mg total) by mouth daily. Swallow whole.   atorvastatin (LIPITOR) 80 MG tablet TAKE 1 TABLET(80 MG) BY MOUTH DAILY   carvedilol (COREG) 6.25 MG tablet TAKE 1 TABLET(6.25 MG) BY MOUTH TWICE DAILY   empagliflozin (JARDIANCE) 10 MG TABS tablet Take 1 tablet (10  mg total) by mouth daily.   losartan (COZAAR) 50 MG tablet TAKE 1 TABLET(50 MG) BY MOUTH DAILY   valACYclovir (VALTREX) 500 MG tablet Take 500 mg by mouth as needed. Per Patient only takes once a year    Physical Exam:    VS:  BP 126/82   Pulse 78   Ht 6' (1.829 m)   Wt 270 lb (122.5 kg)   SpO2 97%   BMI 36.62 kg/m    Wt Readings from Last 3 Encounters:  01/18/23 270 lb (122.5 kg)  11/19/22 267 lb (121.1 kg)  07/26/22 267 lb 13.7 oz (121.5 kg)    GEN: Well nourished, well developed, muscular in no acute distress NECK: No JVD; No carotid bruits CARDIAC: RRR, no murmurs, rubs, gallops. Sternal wound well healed RESPIRATORY:  Clear to auscultation without rales, wheezing or rhonchi  ABDOMEN: Soft, non-tender, non-distended EXTREMITIES:  No edema; No acute deformity   Asessement and Plan:.    1. CAD s/p CABG, HLD - doing well without angina. Lipids are being monitored both in primary care and with his hormone provider. Continue ASA, atorvastatin, carvedilol.   2. Hypertrophic cardiomyopathy - he is agreeable to repeat 7 day Zio for eval. He does not want this placed today. We are getting echo to f/u his aorta size, this will re-eval his LVH as well. Continue current medical therapy. Initial BP slightly elevated 130/88 with f/u BP 126/82. I shared concerns today about testosterone supplementation and its role in risk of cardiovascular disease. He will continue to follow with Dr. Izora Ribas. No high risk symptoms. He wants to keep f/u with Dr. Salena Saner in February. He declined genetic testing at this time.  3. Dilation of aorta - will f/u with echocardiogram  especially given the intensity of activity he participates in.  4. CKD stage 2 - most recent labs have demonstrated stability. Question role of protein consumption as well.  5. Mobitz 1 AVB - no symptoms. F/u Zio ordered.    Disposition: F/u with Dr. Izora Ribas as scheduled 03/2023 per patient preference. Also advised ongoing f/u  of abnormal liver function and hemoglobin with PCP, may be related to testosterone supplementation.  Signed, Laurann Montana, PA-C

## 2023-01-18 ENCOUNTER — Encounter: Payer: Self-pay | Admitting: Physician Assistant

## 2023-01-18 ENCOUNTER — Ambulatory Visit: Payer: 59 | Attending: Physician Assistant

## 2023-01-18 ENCOUNTER — Ambulatory Visit: Payer: 59 | Attending: Physician Assistant | Admitting: Physician Assistant

## 2023-01-18 VITALS — BP 126/82 | HR 78 | Ht 72.0 in | Wt 270.0 lb

## 2023-01-18 DIAGNOSIS — E785 Hyperlipidemia, unspecified: Secondary | ICD-10-CM

## 2023-01-18 DIAGNOSIS — I422 Other hypertrophic cardiomyopathy: Secondary | ICD-10-CM

## 2023-01-18 DIAGNOSIS — I77819 Aortic ectasia, unspecified site: Secondary | ICD-10-CM

## 2023-01-18 DIAGNOSIS — I2510 Atherosclerotic heart disease of native coronary artery without angina pectoris: Secondary | ICD-10-CM | POA: Diagnosis not present

## 2023-01-18 DIAGNOSIS — I441 Atrioventricular block, second degree: Secondary | ICD-10-CM

## 2023-01-18 DIAGNOSIS — N182 Chronic kidney disease, stage 2 (mild): Secondary | ICD-10-CM

## 2023-01-18 NOTE — Progress Notes (Unsigned)
Enrolled patient for a 7 day [name] monitor to be mailed to patients home   [phone]

## 2023-01-18 NOTE — Patient Instructions (Signed)
Medication Instructions:  Your physician recommends that you continue on your current medications as directed. Please refer to the Current Medication list given to you today.  *If you need a refill on your cardiac medications before your next appointment, please call your pharmacy*   Lab Work: NONE If you have labs (blood work) drawn today and your tests are completely normal, you will receive your results only by: MyChart Message (if you have MyChart) OR A paper copy in the mail If you have any lab test that is abnormal or we need to change your treatment, we will call you to review the results.   Testing/Procedures: Your physician has requested that you have an echocardiogram. Echocardiography is a painless test that uses sound waves to create images of your heart. It provides your doctor with information about the size and shape of your heart and how well your heart's chambers and valves are working. This procedure takes approximately one hour. There are no restrictions for this procedure. Please do NOT wear cologne, perfume, aftershave, or lotions (deodorant is allowed). Please arrive 15 minutes prior to your appointment time.  Please note: We ask at that you not bring children with you during ultrasound (echo/ vascular) testing. Due to room size and safety concerns, children are not allowed in the ultrasound rooms during exams. Our front office staff cannot provide observation of children in our lobby area while testing is being conducted. An adult accompanying a patient to their appointment will only be allowed in the ultrasound room at the discretion of the ultrasound technician under special circumstances. We apologize for any inconvenience.   [name]- Long Term Monitor Instructions  Your physician has requested you wear a ZIO patch monitor for 14 days.  This is a single patch monitor. [name] supplies one patch monitor per enrollment. Additional stickers are not available. Please do  not apply patch if you will be having a Nuclear Stress Test,  Echocardiogram, Cardiac CT, MRI, or Chest Xray during the period you would be wearing the  monitor. The patch cannot be worn during these tests. You cannot remove and re-apply the  [name] patch monitor.  Your ZIO patch monitor will be mailed 3 day USPS to your address on file. It may take 3-5 days  to receive your monitor after you have been enrolled.  Once you have received your monitor, please review the enclosed instructions. Your monitor  has already been registered assigning a specific monitor serial # to you.  Billing and Patient Assistance Program Information  We have supplied [name] with any of your insurance information on file for billing purposes. [name] offers a sliding scale Patient Assistance Program for patients that do not have  insurance, or whose insurance does not completely cover the cost of the ZIO monitor.  You must apply for the Patient Assistance Program to qualify for this discounted rate.  To apply, please call [name] at [phone], select option 4, select option 2, ask to apply for  Patient Assistance Program. [name] will ask your household income, and how many people  are in your household. They will quote your out-of-pocket cost based on that information.  [name] will also be able to set up a 42-month, interest-free payment plan if needed.  Applying the monitor   Shave hair from upper left chest.  Hold abrader disc by orange tab. Rub abrader in 40 strokes over the upper left chest as  indicated in your monitor instructions.  Clean area with 4 enclosed alcohol  pads. Let dry.  Apply patch as indicated in monitor instructions. Patch will be placed under collarbone on left  side of chest with arrow pointing upward.  Rub patch adhesive wings for 2 minutes. Remove white label marked "1". Remove the white  label marked "2". Rub patch adhesive wings for 2 additional minutes.  While looking in a  mirror, press and release button in center of patch. A small green light will  flash 3-4 times. This will be your only indicator that the monitor has been turned on.  Do not shower for the first 24 hours. You may shower after the first 24 hours.  Press the button if you feel a symptom. You will hear a small click. Record Date, Time and  Symptom in the Patient Logbook.  When you are ready to remove the patch, follow instructions on the last 2 pages of Patient  Logbook. Stick patch monitor onto the last page of Patient Logbook.  Place Patient Logbook in the blue and white box. Use locking tab on box and tape box closed  securely. The blue and white box has prepaid postage on it. Please place it in the mailbox as  soon as possible. Your physician should have your test results approximately 7 days after the  monitor has been mailed back to [facility].  Call [facility] Customer Care at [phone] if you have questions regarding  your [name] patch monitor. Call them immediately if you see an orange light blinking on your  monitor.  If your monitor falls off in less than 4 days, contact our Monitor department at [phone].  If your monitor becomes loose or falls off after 4 days call [name] at [phone] for  suggestions on securing your monitor  Follow-Up: At [facility], you and your health needs are our priority.  As part of our continuing mission to provide you with exceptional heart care, we have created designated Provider Care Teams.  These Care Teams include your primary Cardiologist (physician) and Advanced Practice Providers (APPs -  Physician Assistants and Nurse Practitioners) who all work together to provide you with the care you need, when you need it.  We recommend signing up for the patient portal called "MyChart".  Sign up information is provided on this After Visit Summary.  MyChart is used to connect with patients for Virtual Visits (Telemedicine).   Patients are able to view lab/test results, encounter notes, upcoming appointments, etc.  Non-urgent messages can be sent to your provider as well.   To learn more about what you can do with MyChart, go to [email or web address].    Your next appointment:   KEEP FOLLOW-UP APPOINTMENT

## 2023-01-25 DIAGNOSIS — I422 Other hypertrophic cardiomyopathy: Secondary | ICD-10-CM

## 2023-01-26 ENCOUNTER — Other Ambulatory Visit: Payer: Self-pay | Admitting: Internal Medicine

## 2023-02-26 ENCOUNTER — Ambulatory Visit (HOSPITAL_COMMUNITY): Payer: 59 | Attending: Cardiology

## 2023-02-26 DIAGNOSIS — I77819 Aortic ectasia, unspecified site: Secondary | ICD-10-CM | POA: Insufficient documentation

## 2023-02-26 DIAGNOSIS — I340 Nonrheumatic mitral (valve) insufficiency: Secondary | ICD-10-CM

## 2023-02-26 LAB — ECHOCARDIOGRAM COMPLETE
AV Vena cont: 0.21 cm
Area-P 1/2: 4.8 cm2
Calc EF: 52.3 %
S' Lateral: 3.07 cm
Single Plane A2C EF: 52.7 %
Single Plane A4C EF: 50.5 %

## 2023-02-28 ENCOUNTER — Telehealth: Payer: Self-pay

## 2023-02-28 NOTE — Telephone Encounter (Signed)
Call to patient to advise of echo results. Per D. Dunn, PA-C, advised patient that "his heart pump function looks to be around 45% (normal is around 55%)". Advised that Dayna and  Dr. Izora Ribas spoke and he felt this likely reflect his prior anterior heart attack and he feels LVEF is similar to slightly improved from 2023 Cardiac MRI. There is severe thickening of his heart which Dr. Izora Ribas favors to be Athlete's heart versus true hypertrophy (hypertrophic cardiomyopathy), aorta size is within normal dimensions.   Patient advises he is having no symptoms at this time and is content to wait for his appointment with Dr. Izora Ribas in February. Advised patient to notify our office for any new concerns.

## 2023-02-28 NOTE — Telephone Encounter (Signed)
-----   Message from Laurann Montana sent at 02/28/2023  8:14 AM EST ----- Please let pt know that his heart pump function looks to be around 45% (normal is around 55%) - I asked Dr. Izora Ribas to review. He felt this likely reflect his prior anterior heart attack and he feels LVEF is similar to slightly improved from 2023 Cardiac MRI. There is severe thickening of his heart which Dr. Izora Ribas favors to be Athlete's heart versus true hypertrophy (hypertrophic cardiomyopathy). He feels the aorta size is within normal dimensions when index to age, gender, and body surface area. Per his recommendation, if he is still asymptomatic, will not make changes; he looks forward to catching up with him in February. Notify for any new concerns.

## 2023-03-25 ENCOUNTER — Ambulatory Visit: Payer: 59 | Attending: Internal Medicine | Admitting: Internal Medicine

## 2023-03-25 VITALS — BP 122/80 | HR 72 | Ht 72.0 in | Wt 260.0 lb

## 2023-03-25 DIAGNOSIS — I25709 Atherosclerosis of coronary artery bypass graft(s), unspecified, with unspecified angina pectoris: Secondary | ICD-10-CM | POA: Diagnosis not present

## 2023-03-25 DIAGNOSIS — Z951 Presence of aortocoronary bypass graft: Secondary | ICD-10-CM | POA: Diagnosis not present

## 2023-03-25 DIAGNOSIS — I422 Other hypertrophic cardiomyopathy: Secondary | ICD-10-CM

## 2023-03-25 DIAGNOSIS — I471 Supraventricular tachycardia, unspecified: Secondary | ICD-10-CM | POA: Diagnosis not present

## 2023-03-25 DIAGNOSIS — I441 Atrioventricular block, second degree: Secondary | ICD-10-CM | POA: Diagnosis not present

## 2023-03-25 DIAGNOSIS — E782 Mixed hyperlipidemia: Secondary | ICD-10-CM

## 2023-03-25 NOTE — Patient Instructions (Signed)
Medication Instructions:  Your physician recommends that you continue on your current medications as directed. Please refer to the Current Medication list given to you today.  *If you need a refill on your cardiac medications before your next appointment, please call your pharmacy*   Lab Work: NONE  If you have labs (blood work) drawn today and your tests are completely normal, you will receive your results only by: MyChart Message (if you have MyChart) OR A paper copy in the mail If you have any lab test that is abnormal or we need to change your treatment, we will call you to review the results.   Testing/Procedures: Your physician has requested that have Genetic Testing for Hypertrophic Cardiomyopathy.  You will receive a test kit in the mail that you will complete and mail in.  Please contact our office with questions or concerns.    Follow-Up: At [facility], you and your health needs are our priority.  As part of our continuing mission to provide you with exceptional heart care, we have created designated Provider Care Teams.  These Care Teams include your primary Cardiologist (physician) and Advanced Practice Providers (APPs -  Physician Assistants and Nurse Practitioners) who all work together to provide you with the care you need, when you need it.   Your next appointment:   1 year(s)  Provider:   Christell Constant, MD

## 2023-03-25 NOTE — Progress Notes (Addendum)
Cardiology Office Note:  .    Date:  03/25/2023  ID:  Roberto Morrison, DOB [birth year], MRN [id number] PCP: Roberto Butter, NP  [facility] Providers Cardiologist:  Roberto Constant, Morrison     CC: Follow up CAD and HCM Care  History of Present Illness: .    Discussed the use of AI scribe software for clinical note transcription with the patient, who gave verbal consent to proceed.  Roberto Morrison is a 53 y.o. male with coronary artery disease status post CABG and potential hypertrophic cardiomyopathy who presents for follow-up on his cardiac conditions.  He has a history of coronary artery disease and has undergone coronary artery bypass grafting (CABG). He is currently doing well post-surgery with no recent chest discomfort. His cholesterol is elevated but controlled with medication. There is no current evidence of non-sustained ventricular tachycardia, and his heart function is stable to slightly improved.  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 Recent echocardiogram shows heart thickness at 14-15 mm, which is borderline for hypertrophic cardiomyopathy. Deconditioning due to a tricep injury has led to a decrease in heart thickness, suggesting athlete's heart. No family history of hypertrophic cardiomyopathy. He occasionally feels his heart beating, described as a 'twitch', which began after a stressful period but has since become almost nonexistent.  He has a history of Mobitz type I heart block but is asymptomatic, and the condition is not causing any significant issues at present.  He has stage 2 chronic kidney disease, but there is no specific discussion about current management or changes in this condition during the visit.  He is actively involved in bodybuilding competitions and is currently preparing for a summer competition. He has lost nearly 20 pounds and is focusing on weight management. He recalls a past incident  where cellulitis was noted on his chart, which he attributes to an infection following skin cancer removal, expressing concern about this being inaccurately documented as cellulitis.   Relevant histories: .  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  No ectopy on subsequent testing; no family history of HCM.  Sees a hormone guy in [place]. 2024: no further competitions. He has skin surgery under [facility] with no issues.  He had a ruptured triceps and is pending surgery. ROS: As per HPI.   Studies Reviewed: .   Cardiac Studies & Procedures   ______________________________________________________________________________________________ CARDIAC CATHETERIZATION  CARDIAC CATHETERIZATION 07/22/2020  Narrative Images from the original result were not included.   Prox LAD to Mid LAD lesion is 90% stenosed.  Prox Cx to Mid Cx lesion is 90% stenosed.  Mid RCA lesion is 50% stenosed.  Roberto Morrison is a 53 y.o. male   [id number] LOCATION:  FACILITY: [facility] PHYSICIAN: Roberto Morrison, M.D. Jun 25, 1970   DATE OF PROCEDURE:  07/22/2020  DATE OF DISCHARGE:     CARDIAC CATHETERIZATION    History obtained from chart review.53 y.o. male with no PMH who presented with chest pain after being at the gym and noted to have NSTEMI.  Impression Roberto Morrison was admitted with a non-STEMI.  He has three-vessel disease.  He has a long segmental mid LAD lesion and a moderately long mid AV groove circumflex lesion lesion with what appears to be a moderate though not significant distal RCA lesion.  His filling pressures were normal.  His EF looked preserved.  I believe he would be best served with  coronary artery bypass grafting with bilateral IMA's.  Surgical consult has been called.  The sheath was removed and a TR band was placed on the right wrist to achieve patent hemostasis.  The patient left the lab in stable condition.  Plan will be to restart IV  heparin 4 hours after sheath removal.  Roberto Morrison, Roberto Morrison 07/22/2020 4:11 PM  Findings Coronary Findings Diagnostic  Dominance: Right  Left Anterior Descending Prox LAD to Mid LAD lesion is 90% stenosed.  Left Circumflex Prox Cx to Mid Cx lesion is 90% stenosed.  Right Coronary Artery Mid RCA lesion is 50% stenosed.  Intervention  No interventions have been documented.   STRESS TESTS  EXERCISE TOLERANCE TEST (ETT) 07/11/2021  Narrative   The ECG was negative for ischemia.   No ST deviation was noted.   Exercise capacity was excellent. Stage 4 was reached after exercising for 11 min and 39 sec. Maximum HR of 145 bpm. MPHR 85.0 %. Peak METS 13.7 .   Hypertensive blood pressure and normal heart rate response noted during stress.   ECHOCARDIOGRAM  ECHOCARDIOGRAM COMPLETE 02/26/2023  Narrative ECHOCARDIOGRAM REPORT    Patient Name:   Roberto Morrison Date of Exam: 02/26/2023 Medical Rec #:  [id number]     Height:       72.0 in Accession #:    [id number]    Weight:       270.0 lb Date of Birth:  11-15-1970      BSA:          2.420 m Patient Age:    53 years      BP:           126/82 mmHg Patient Gender: M             HR:           73 bpm. Exam Location:  [address]  Procedure: 2D Echo, 3D Echo, Cardiac Doppler, Color Doppler and Strain Analysis  Indications:    Dilation of aorta (HCC) [8295621]  History:        Patient has prior history of Echocardiogram examinations, most recent 05/01/2021. Hypertrophic Cardiomyopathy, CAD and Previous Myocardial Infarction; Risk Factors:Diabetes and Dyslipidemia.  Sonographer:    Roberto Morrison RDCS Referring Phys: [phone] Roberto Morrison   Sonographer Comments: Global longitudinal strain was attempted. IMPRESSIONS   1. Left ventricular ejection fraction, by estimation, is 45 to 50%. The left ventricle has mildly decreased function. The left ventricle demonstrates global hypokinesis. There is mild concentric left ventricular  hypertrophy. Left ventricular diastolic parameters are indeterminate. The average left ventricular global longitudinal strain is -16.5 %. The global longitudinal strain is abnormal. 2. Right ventricular systolic function is normal. The right ventricular size is normal. There is normal pulmonary artery systolic pressure. 3. The mitral valve is normal in structure. Mild mitral valve regurgitation. No evidence of mitral stenosis. 4. The aortic valve is normal in structure. Aortic valve regurgitation is mild. No aortic stenosis is present. 5. Aortic dilatation noted. There is mild dilatation of the aortic root, measuring 38 mm. There is mild dilatation of the ascending aorta, measuring 38 mm. 6. The inferior vena cava is normal in size with greater than 50% respiratory variability, suggesting right atrial pressure of 3 mmHg.  FINDINGS Left Ventricle: Left ventricular ejection fraction, by estimation, is 45 to 50%. The left ventricle has mildly decreased function. The left ventricle demonstrates global hypokinesis. The average left ventricular global longitudinal strain is -16.5 %. The global  longitudinal strain is abnormal. The left ventricular internal cavity size was normal in size. There is mild concentric left ventricular hypertrophy. Left ventricular diastolic parameters are indeterminate.  Right Ventricle: The right ventricular size is normal. No increase in right ventricular wall thickness. Right ventricular systolic function is normal. There is normal pulmonary artery systolic pressure. The tricuspid regurgitant velocity is 2.40 m/s, and with an assumed right atrial pressure of 3 mmHg, the estimated right ventricular systolic pressure is 26.0 mmHg.  Left Atrium: Left atrial size was normal in size.  Right Atrium: Right atrial size was normal in size.  Pericardium: There is no evidence of pericardial effusion.  Mitral Valve: The mitral valve is normal in structure. Mild mitral valve  regurgitation. No evidence of mitral valve stenosis.  Tricuspid Valve: The tricuspid valve is normal in structure. Tricuspid valve regurgitation is trivial. No evidence of tricuspid stenosis.  Aortic Valve: The aortic valve is normal in structure. Aortic valve regurgitation is mild. No aortic stenosis is present.  Pulmonic Valve: The pulmonic valve was normal in structure. Pulmonic valve regurgitation is not visualized. No evidence of pulmonic stenosis.  Aorta: Aortic dilatation noted. There is mild dilatation of the aortic root, measuring 38 mm. There is mild dilatation of the ascending aorta, measuring 38 mm.  Venous: The inferior vena cava is normal in size with greater than 50% respiratory variability, suggesting right atrial pressure of 3 mmHg.  IAS/Shunts: No atrial level shunt detected by color flow Doppler.   LEFT VENTRICLE PLAX 2D LVIDd:         4.76 cm      Diastology LVIDs:         3.07 cm      LV e' medial:    9.81 cm/s LV PW:         1.19 cm      LV E/e' medial:  6.6 LV IVS:        1.14 cm      LV e' lateral:   11.00 cm/s LVOT diam:     2.29 cm      LV E/e' lateral: 5.9 LV SV:         82 LV SV Index:   34           2D Longitudinal Strain LVOT Area:     4.12 cm     2D Strain GLS Avg:     -16.5 %  LV Volumes (MOD) LV vol d, MOD A2C: 128.0 ml 3D Volume EF: LV vol d, MOD A4C: 130.0 ml 3D EF:        46 % LV vol s, MOD A2C: 60.5 ml  LV EDV:       196 ml LV vol s, MOD A4C: 64.3 ml  LV ESV:       105 ml LV SV MOD A2C:     67.5 ml  LV SV:        91 ml LV SV MOD A4C:     130.0 ml LV SV MOD BP:      68.2 ml  RIGHT VENTRICLE RV S prime:     6.47 cm/s TAPSE (M-mode): 1.2 cm  LEFT ATRIUM             Index        RIGHT ATRIUM           Index LA diam:        4.25 cm 1.76 cm/m   RA Area:     15.50  cm LA Vol (A2C):   60.5 ml 25.00 ml/m  RA Volume:   35.60 ml  14.71 ml/m LA Vol (A4C):   51.7 ml 21.36 ml/m LA Biplane Vol: 56.9 ml 23.51 ml/m AORTIC VALVE LVOT Vmax:          99.00 cm/s LVOT Vmean:        65.000 cm/s LVOT VTI:          0.198 m AR Vena Contracta: 0.21 cm  AORTA Ao Root diam: 3.79 cm Ao Asc diam:  3.78 cm  MITRAL VALVE               TRICUSPID VALVE MV Area (PHT): 4.80 cm    TR Peak grad:   23.0 mmHg MV Decel Time: 158 msec    TR Vmax:        240.00 cm/s MV E velocity: 64.70 cm/s MV A velocity: 47.10 cm/s  SHUNTS MV E/A ratio:  1.37        Systemic VTI:  0.20 m Systemic Diam: 2.29 cm  Kardie Tobb DO Electronically signed by Thomasene Ripple DO Signature Date/Time: 02/26/2023/3:13:37 PM    Final   TEE  ECHO INTRAOPERATIVE TEE 07/26/2020  Narrative *INTRAOPERATIVE TRANSESOPHAGEAL REPORT *    Patient Name:   Roberto Morrison  Date of Exam: 07/26/2020 Medical Rec #:  [id number]      Height:       72.0 in Accession #:    [id number]     Weight:       280.6 lb Date of Birth:  1970-09-22       BSA:          2.46 m Patient Age:    50 years       BP:           135/85 mmHg Patient Gender: M              HR:           73 bpm. Exam Location:  Anesthesiology  Transesophogeal exam was perform intraoperatively during surgical procedure. Patient was closely monitored under general anesthesia during the entirety of examination.  Indications:     Coronary Artery Disease Performing Phys: [id number] ATKINS Diagnosing Phys: Kipp Brood Morrison  Complications: No known complications during this procedure. PRE-OP FINDINGS Left Ventricle: The LV end-diastolic diameter was enlarged and measured 6.3 cm at the mid-papillary level in the trans-gastric short axis view. There was normal LV wall thicness and normal LV systolic function with the ejection fraction calculated at 55-60% using the 3DQ software. There were no regional wall motion abnormalities.  On the post-bypass view there was mild to moderate global LV dysfunction without regional wall motion abnormalities. The ejection fraction was calculated at 40-45% using the 2D Simpson's method.   Right  Ventricle: The right ventricle has normal systolic function. The cavity was normal. There is no increase in right ventricular wall thickness. The right ventricle was normal in size with normal systolic function.  On the post-bypass exam, the RV cavity was enlarged from the pre-bypass and there was mild-moderate RV systolic dysfunction.  Left Atrium: The left atrium was enlarged and measured 4.4 cm in diameter in the medial-lateral dimension. The left atrial appendage is well visualized and there is no evidence of thrombus present.  Right Atrium: Right atrial size was normal in size.  Interatrial Septum: No atrial level shunt detected by color flow Doppler. There is no evidence of a patent foramen ovale.  Pericardium:  There is no evidence of pericardial effusion.  Mitral Valve: The mitral valve is normal in structure. Mitral valve regurgitation is trivial by color flow Doppler. There is No evidence of mitral stenosis.  Tricuspid Valve: The tricuspid valve was normal in structure. Tricuspid valve regurgitation is trivial by color flow Doppler. No evidence of tricuspid stenosis is present.  Aortic Valve: The aortic valve is tricuspid Aortic valve regurgitation is mild by color flow Doppler. The jet is centrally-directed. There is no stenosis of the aortic valve.   Pulmonic Valve: The pulmonic valve was normal in structure, with normal. No evidence of pulmonic stenosis. Pulmonic valve regurgitation is trivial by color flow Doppler.   Aorta: The aortic root and ascending aorta are normal in size and structure. The aortic root and proximal ascending aorta were normal in contour and diameter with normal wall thickness with no evidence of atherosclerotic disease.  The descending aorta was normal in diameter and free of atherosclerotic disease.  Aortic root: 3.4 cm Sinotubular junction: 2.5 cm Proximal ascending aorta: 3.3 cm Descending thoracic aorta: 2.8 cm.  Shunts: There is no evidence of  an atrial septal defect.   Kipp Brood Morrison Electronically signed by Kipp Brood Morrison Signature Date/Time: 07/26/2020/5:18:27 PM    Final  MONITORS  LONG TERM MONITOR (3-14 DAYS) 02/12/2023  Narrative   Patient had a minimum heart rate of 51 bpm, maximum heart rate of 137 bpm, and average heart rate of 78 bpm. Predominant underlying rhythm was sinus rhythm. One for beat run of SVT Isolated PACs were rare (<1.0%). Isolated PVCs were rare (<1.0%). No symptoms.  No malignant arrhythmias.     CARDIAC MRI  MR CARDIAC MORPHOLOGY W WO CONTRAST 05/22/2021  Narrative CLINICAL DATA:  Clinical question of hypertrophic cardiomyopathy Study assumes HCT of 50 and BSA of 2.56 m2.  EXAM: CARDIAC MRI  TECHNIQUE: The patient was scanned on a 1.5 Tesla GE magnet. A dedicated cardiac coil was used. Functional imaging was done using Fiesta sequences. 2,3, and 4 chamber views were done to assess for RWMA's. Modified Simpson's rule using a short axis stack was used to calculate an ejection fraction on a dedicated work [employer]. The patient received 10 cc of Gadavist. After 10 minutes inversion recovery sequences were used to assess for infiltration and scar tissue.  CONTRAST:  10 cc  of Gadavist  FINDINGS: 1. Mild left ventricular dilation, with LVEDD 66 mm, but LVEDVi 96 mL/m2.  Asymmetric septal hypertrophy, with intraventricular septal thickness of 17 mm, posterior wall thickness of 8 mm, and myocardial mass index of 92 g/m2, mildly elevated.  Moderately reduced left ventricular systolic function (LVEF =39%). Apical septal hypokinesis, mid septal hypokinesis, basal septal hypokinesis. There is no evidence of systolic anterior motion of the mitral valve or of LVOT Obstruction.  Left ventricular parametric mapping notable for normal native T1, T2 signal.  There is no late gadolinium enhancement in the left ventricular myocardium.  2. Normal right  ventricular size with RVEDVI 54 mL/m2.  Normal right ventricular thickness.  Normal right ventricular systolic function (RVEF =48%). There are no regional wall motion abnormalities or aneurysms.  3.  Normal left and right atrial size.  4. Normal size of the aortic root, ascending aorta and pulmonary artery.  5. Valve assessment:  Aortic Valve: Tri-leaflet. Qualitatively mild central regurgitation.  Pulmonic Valve: Qualitatively, there is no significant regurgitation.  Tricuspid Valve: Qualitatively mild central regurgitation.  Mitral Valve: Qualitatively, there is no significant regurgitation.  6.  Normal pericardium.  No pericardial effusion.  7. Grossly, no extracardiac findings. Prior median sternotomy scar noted with associated artifact. Recommended dedicated study if concerned for non-cardiac pathology.  8. Wrap around and breath hold artifacts noted. This decreased the sensitivity of volumetric evaluation of valve disease.  IMPRESSION: No evidence of infiltrative disease.  In lieu of system disease, study would be consistent with hypertrophic cardiomyopathy.  Because of wall motion related to prior ischemia, cannot use strain imaging to differentiate athlete's heart hypertrophy from pathogenic hypertrophy.  Riley Lam Morrison   Electronically Signed By: Riley Lam M.D. On: 05/22/2021 13:07   ______________________________________________________________________________________________      Physical Exam:    VS:  BP 122/80 (BP Location: Right Arm)   Pulse 72   Ht 6' (1.829 m)   Wt 260 lb (117.9 kg)   SpO2 98%   BMI 35.26 kg/m    Wt Readings from Last 3 Encounters:  03/25/23 260 lb (117.9 kg)  01/18/23 270 lb (122.5 kg)  11/19/22 267 lb (121.1 kg)    Gen: no distress   Neck: No JVD Cardiac: No Rubs or Gallops, no murmur, RRR +2 radial pulses Respiratory: Clear to auscultation bilaterally, normal effort, normal  respiratory  rate GI: Soft, nontender, non-distended  MS: No  edema;  moves all extremities Integument: Skin feels warm Neuro:  At time of evaluation, alert and oriented to person/place/time/situation  Psych: Normal affect, patient feels well  ASSESSMENT AND PLAN: .    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 NYHA I - with partial deconditioning septal thickness decreased from 17 mm to 14-15 mm - suspicion of Fabry's/Danon/Noonan's or other mimics of HCM: High - Gene variant: Sent - Offer genetic testing for HCM Through [facility] - Monitor for symptoms of palpitations or syncope  Family history Reviewed, Discussed family screening  His brother is not amenable to screening  SCD  Assessment - Exercise testing normal for normal rhythm and no change in blood pressure or syncope on exercise testing - LVEF < 50% but related to CAD - CMR from 2023 notable for No LGE - no NSVT this year - SCD risk estimated to be greater than 1.27% at 5 years SDM: we have discussed that his decrease LVEF is from ischemic cardiomyopathy, that his deconditioning improved the thickness of his heart (partial deconditioning with partial improvement), and that he has one high risk feature.  He has not been amenable to ICD in the past.  We reviewed that if he has further ectopy or syncope we will once again discuss this; I will also re-discussed this if his genetic testing comes back positive.  Coronary Artery Disease (CAD) Status Post-CABG CAD status post-CABG. No evidence of non-sustained VT on recent heart monitor. Occasional palpitations likely SVT, but no significant symptoms. Cholesterol reasonably controlled. Discussed stroke risks associated with CABG and importance of monitoring for symptoms. - Continue current management - Monitor for symptoms of chest discomfort or palpitations - Follow-up in one year  Mobitz Type I Heart Block Mobitz type I heart  block. No current symptoms. No significant issues expected unless symptoms develop. - Monitor for symptoms, he has asymptomatic SVT with no further sx after his friends recovery from CABG  Chronic Kidney Disease (CKD) Stage 2 CKD stage 2. No specific changes in management discussed during this visit.  General Health Maintenance Discussed importance of monitoring for symptoms and maintaining overall health. - Encourage regular follow-up and monitoring of symptoms -  Discuss stress management and overall health maintenance   Riley Lam, Morrison FASE [facility] Cardiologist [facility]  [address] [place], [address] [place], [place] [address] [phone]  11:02 AM

## 2023-04-03 ENCOUNTER — Telehealth: Payer: Self-pay

## 2023-04-03 NOTE — Telephone Encounter (Signed)
 Called pt left a message advising [facility] no longer offers free genetic testing for HCM.  Advised pt there is an option to pay for testing if he would like.  Asked that he call our office with decision.

## 2023-04-08 ENCOUNTER — Other Ambulatory Visit (HOSPITAL_BASED_OUTPATIENT_CLINIC_OR_DEPARTMENT_OTHER): Payer: Self-pay | Admitting: Cardiovascular Disease

## 2023-04-08 DIAGNOSIS — I255 Ischemic cardiomyopathy: Secondary | ICD-10-CM

## 2023-05-06 NOTE — Telephone Encounter (Signed)
 Called pt advised [facility] no longer offers free genetic testing for HCM.  Pt reports will not do testing at this time.  If he choices to do testing he already has kit will reach out to [employer] for cost.

## 2023-07-16 ENCOUNTER — Other Ambulatory Visit (HOSPITAL_BASED_OUTPATIENT_CLINIC_OR_DEPARTMENT_OTHER): Payer: Self-pay | Admitting: Internal Medicine

## 2023-07-16 DIAGNOSIS — I255 Ischemic cardiomyopathy: Secondary | ICD-10-CM

## 2023-09-24 ENCOUNTER — Encounter (INDEPENDENT_AMBULATORY_CARE_PROVIDER_SITE_OTHER): Payer: Self-pay | Admitting: Internal Medicine

## 2023-09-24 DIAGNOSIS — I422 Other hypertrophic cardiomyopathy: Secondary | ICD-10-CM | POA: Diagnosis not present

## 2023-09-25 ENCOUNTER — Telehealth: Payer: Self-pay | Admitting: Internal Medicine

## 2023-09-25 NOTE — Telephone Encounter (Signed)
 Pt c/o medication issue:  1. Name of Medication: atorvastatin  (LIPITOR ) 80 MG tablet   carvedilol  (COREG ) 6.25 MG tablet    JARDIANCE  10 MG TABS tablet    losartan  (COZAAR ) 50 MG tablet   2. How are you currently taking this medication (dosage and times per day)?  TAKE 1 TABLET(80 MG) BY MOUTH DAILY     TAKE 1 TABLET(6.25 MG) BY MOUTH TWICE DAILY     TAKE 1 TABLET(10 MG) BY MOUTH DAILY     TAKE 1 TABLET(50 MG) BY MOUTH DAILY     3. Are you having a reaction (difficulty breathing--STAT)? No  4. What is your medication issue? Pt is requesting a callback regarding him wanting to discuss these medications and the reason/benefits of them. Please advise

## 2023-09-29 NOTE — Telephone Encounter (Signed)
 Please see the MyChart message reply(ies) for my assessment and plan.    This patient gave consent for this Medical Advice Message and is aware that it may result in a bill to [employer], as well as the possibility of receiving a bill for a co-payment or deductible. They are an established patient, but are not seeking medical advice exclusively about a problem treated during an in person or video visit in the last seven days. I did not recommend an in person or video visit within seven days of my reply.    I spent a total of 7 minutes cumulative time within 7 days through [employer].  Jann Melody, MD

## 2023-09-29 NOTE — Telephone Encounter (Signed)
 Chart reviewed: - patient would like to come off some medication - hx of CAD, presumed HCM (see prior notes) and HFrEF - Carvedilol  for LVEF GDMT and mortality benefit  - Atorvastatin - LDL is currently under our set goal (70) on current therapy - Jardiance - for LVEF, GDMT, and mortality - Losartan - for LVEF- GDMT, and mortaility - Preference is for him to stay on this therapy - easiest medication to trial off of his statin: repeat lipids in three months and restart if not at goal

## 2023-10-20 ENCOUNTER — Other Ambulatory Visit: Payer: Self-pay | Admitting: Internal Medicine

## 2023-10-30 ENCOUNTER — Other Ambulatory Visit: Payer: Self-pay | Admitting: Cardiovascular Disease

## 2024-03-03 ENCOUNTER — Other Ambulatory Visit (HOSPITAL_BASED_OUTPATIENT_CLINIC_OR_DEPARTMENT_OTHER): Payer: Self-pay | Admitting: Internal Medicine

## 2024-03-03 DIAGNOSIS — I255 Ischemic cardiomyopathy: Secondary | ICD-10-CM

## 2024-03-06 ENCOUNTER — Other Ambulatory Visit (HOSPITAL_BASED_OUTPATIENT_CLINIC_OR_DEPARTMENT_OTHER): Payer: Self-pay | Admitting: Internal Medicine

## 2024-03-06 DIAGNOSIS — I255 Ischemic cardiomyopathy: Secondary | ICD-10-CM

## 2024-03-11 NOTE — Telephone Encounter (Signed)
 Overdue Lipids. Can Complete at March 2026 F/U

## 2024-04-22 ENCOUNTER — Ambulatory Visit: Admitting: Internal Medicine
